# Patient Record
Sex: Male | Born: 1950 | Race: Black or African American | Hispanic: No | Marital: Single | Smoking: Current every day smoker
Health system: Southern US, Community
[De-identification: ages and names within clinical notes are randomized; demographics above are authoritative.]

## PROBLEM LIST (undated history)

## (undated) DIAGNOSIS — I428 Other cardiomyopathies: Secondary | ICD-10-CM

## (undated) DIAGNOSIS — G8929 Other chronic pain: Secondary | ICD-10-CM

## (undated) DIAGNOSIS — R7401 Elevation of levels of liver transaminase levels: Secondary | ICD-10-CM

## (undated) DIAGNOSIS — I77810 Thoracic aortic ectasia: Secondary | ICD-10-CM

## (undated) DIAGNOSIS — I5020 Unspecified systolic (congestive) heart failure: Secondary | ICD-10-CM

## (undated) DIAGNOSIS — Z720 Tobacco use: Secondary | ICD-10-CM

## (undated) DIAGNOSIS — Z789 Other specified health status: Secondary | ICD-10-CM

## (undated) DIAGNOSIS — Z7289 Other problems related to lifestyle: Secondary | ICD-10-CM

## (undated) DIAGNOSIS — I10 Essential (primary) hypertension: Secondary | ICD-10-CM

## (undated) DIAGNOSIS — I2510 Atherosclerotic heart disease of native coronary artery without angina pectoris: Secondary | ICD-10-CM

## (undated) HISTORY — DX: Other chronic pain: G89.29

## (undated) HISTORY — DX: Other cardiomyopathies: I42.8

## (undated) HISTORY — DX: Atherosclerotic heart disease of native coronary artery without angina pectoris: I25.10

## (undated) HISTORY — DX: Unspecified systolic (congestive) heart failure: I50.20

## (undated) HISTORY — DX: Thoracic aortic ectasia: I77.810

## (undated) HISTORY — DX: Other specified health status: Z78.9

## (undated) HISTORY — DX: Elevation of levels of liver transaminase levels: R74.01

## (undated) HISTORY — DX: Other problems related to lifestyle: Z72.89

---

## 2019-08-25 ENCOUNTER — Emergency Department: Payer: Medicare Other

## 2019-08-25 ENCOUNTER — Encounter: Payer: Self-pay | Admitting: Emergency Medicine

## 2019-08-25 ENCOUNTER — Observation Stay: Payer: Medicare Other

## 2019-08-25 ENCOUNTER — Other Ambulatory Visit: Payer: Self-pay

## 2019-08-25 ENCOUNTER — Inpatient Hospital Stay
Admission: EM | Admit: 2019-08-25 | Discharge: 2019-08-29 | DRG: 286 | Disposition: A | Discharge status: Home / Self Care | Payer: Medicare Other | Source: Ambulatory Visit | Attending: Internal Medicine | Admitting: Internal Medicine

## 2019-08-25 ENCOUNTER — Observation Stay (HOSPITAL_COMMUNITY)
Admit: 2019-08-25 | Discharge: 2019-08-25 | Disposition: A | Discharge status: Home / Self Care | Payer: Medicare Other | Attending: Physician Assistant | Admitting: Physician Assistant

## 2019-08-25 DIAGNOSIS — I248 Other forms of acute ischemic heart disease: Secondary | ICD-10-CM | POA: Diagnosis present

## 2019-08-25 DIAGNOSIS — I272 Pulmonary hypertension, unspecified: Secondary | ICD-10-CM | POA: Diagnosis present

## 2019-08-25 DIAGNOSIS — D696 Thrombocytopenia, unspecified: Secondary | ICD-10-CM | POA: Diagnosis present

## 2019-08-25 DIAGNOSIS — I700 Atherosclerosis of aorta: Secondary | ICD-10-CM | POA: Diagnosis present

## 2019-08-25 DIAGNOSIS — I361 Nonrheumatic tricuspid (valve) insufficiency: Secondary | ICD-10-CM

## 2019-08-25 DIAGNOSIS — Z20822 Contact with and (suspected) exposure to covid-19: Secondary | ICD-10-CM | POA: Diagnosis present

## 2019-08-25 DIAGNOSIS — I5021 Acute systolic (congestive) heart failure: Secondary | ICD-10-CM | POA: Diagnosis present

## 2019-08-25 DIAGNOSIS — Z7289 Other problems related to lifestyle: Secondary | ICD-10-CM

## 2019-08-25 DIAGNOSIS — I110 Hypertensive heart disease with heart failure: Principal | ICD-10-CM | POA: Diagnosis present

## 2019-08-25 DIAGNOSIS — F17210 Nicotine dependence, cigarettes, uncomplicated: Secondary | ICD-10-CM | POA: Diagnosis present

## 2019-08-25 DIAGNOSIS — Z832 Family history of diseases of the blood and blood-forming organs and certain disorders involving the immune mechanism: Secondary | ICD-10-CM

## 2019-08-25 DIAGNOSIS — I420 Dilated cardiomyopathy: Secondary | ICD-10-CM | POA: Diagnosis not present

## 2019-08-25 DIAGNOSIS — R778 Other specified abnormalities of plasma proteins: Secondary | ICD-10-CM | POA: Diagnosis present

## 2019-08-25 DIAGNOSIS — Z720 Tobacco use: Secondary | ICD-10-CM | POA: Diagnosis present

## 2019-08-25 DIAGNOSIS — I959 Hypotension, unspecified: Secondary | ICD-10-CM | POA: Diagnosis not present

## 2019-08-25 DIAGNOSIS — F1090 Alcohol use, unspecified, uncomplicated: Secondary | ICD-10-CM

## 2019-08-25 DIAGNOSIS — I5023 Acute on chronic systolic (congestive) heart failure: Secondary | ICD-10-CM

## 2019-08-25 DIAGNOSIS — Z880 Allergy status to penicillin: Secondary | ICD-10-CM

## 2019-08-25 DIAGNOSIS — Z789 Other specified health status: Secondary | ICD-10-CM

## 2019-08-25 DIAGNOSIS — I509 Heart failure, unspecified: Secondary | ICD-10-CM | POA: Diagnosis not present

## 2019-08-25 DIAGNOSIS — I340 Nonrheumatic mitral (valve) insufficiency: Secondary | ICD-10-CM | POA: Diagnosis not present

## 2019-08-25 DIAGNOSIS — I77810 Thoracic aortic ectasia: Secondary | ICD-10-CM | POA: Diagnosis present

## 2019-08-25 DIAGNOSIS — R609 Edema, unspecified: Secondary | ICD-10-CM | POA: Diagnosis not present

## 2019-08-25 DIAGNOSIS — D6959 Other secondary thrombocytopenia: Secondary | ICD-10-CM | POA: Diagnosis present

## 2019-08-25 DIAGNOSIS — R000 Tachycardia, unspecified: Secondary | ICD-10-CM | POA: Diagnosis present

## 2019-08-25 DIAGNOSIS — I2510 Atherosclerotic heart disease of native coronary artery without angina pectoris: Secondary | ICD-10-CM | POA: Diagnosis present

## 2019-08-25 DIAGNOSIS — I479 Paroxysmal tachycardia, unspecified: Secondary | ICD-10-CM | POA: Diagnosis present

## 2019-08-25 DIAGNOSIS — G8929 Other chronic pain: Secondary | ICD-10-CM | POA: Diagnosis present

## 2019-08-25 DIAGNOSIS — K761 Chronic passive congestion of liver: Secondary | ICD-10-CM | POA: Diagnosis present

## 2019-08-25 DIAGNOSIS — M549 Dorsalgia, unspecified: Secondary | ICD-10-CM | POA: Diagnosis present

## 2019-08-25 DIAGNOSIS — I351 Nonrheumatic aortic (valve) insufficiency: Secondary | ICD-10-CM | POA: Diagnosis not present

## 2019-08-25 DIAGNOSIS — R7989 Other specified abnormal findings of blood chemistry: Secondary | ICD-10-CM | POA: Diagnosis present

## 2019-08-25 DIAGNOSIS — E876 Hypokalemia: Secondary | ICD-10-CM | POA: Diagnosis not present

## 2019-08-25 DIAGNOSIS — R21 Rash and other nonspecific skin eruption: Secondary | ICD-10-CM | POA: Diagnosis present

## 2019-08-25 DIAGNOSIS — R945 Abnormal results of liver function studies: Secondary | ICD-10-CM | POA: Diagnosis present

## 2019-08-25 DIAGNOSIS — I2584 Coronary atherosclerosis due to calcified coronary lesion: Secondary | ICD-10-CM | POA: Diagnosis present

## 2019-08-25 HISTORY — DX: Tobacco use: Z72.0

## 2019-08-25 LAB — ECHOCARDIOGRAM COMPLETE
AR max vel: 2.23 cm2
AV Area VTI: 1.97 cm2
AV Area mean vel: 1.8 cm2
AV Mean grad: 3 mmHg
AV Peak grad: 5.4 mmHg
Ao pk vel: 1.16 m/s
Area-P 1/2: 6.05 cm2
Calc EF: 28.6 %
Height: 73 in
P 1/2 time: 220 msec
S' Lateral: 5.14 cm
Single Plane A2C EF: 26.5 %
Single Plane A4C EF: 29.3 %
Weight: 2480 oz

## 2019-08-25 LAB — CBC WITH DIFFERENTIAL/PLATELET
Abs Immature Granulocytes: 0.02 10*3/uL (ref 0.00–0.07)
Basophils Absolute: 0.1 10*3/uL (ref 0.0–0.1)
Basophils Relative: 1 %
Eosinophils Absolute: 0.1 10*3/uL (ref 0.0–0.5)
Eosinophils Relative: 2 %
HCT: 40 % (ref 39.0–52.0)
Hemoglobin: 14 g/dL (ref 13.0–17.0)
Immature Granulocytes: 0 %
Lymphocytes Relative: 24 %
Lymphs Abs: 1.3 10*3/uL (ref 0.7–4.0)
MCH: 32 pg (ref 26.0–34.0)
MCHC: 35 g/dL (ref 30.0–36.0)
MCV: 91.3 fL (ref 80.0–100.0)
Monocytes Absolute: 0.6 10*3/uL (ref 0.1–1.0)
Monocytes Relative: 11 %
Neutro Abs: 3.4 10*3/uL (ref 1.7–7.7)
Neutrophils Relative %: 62 %
Platelets: 116 10*3/uL — ABNORMAL LOW (ref 150–400)
RBC: 4.38 MIL/uL (ref 4.22–5.81)
RDW: 21.7 % — ABNORMAL HIGH (ref 11.5–15.5)
Smear Review: DECREASED
WBC: 5.5 10*3/uL (ref 4.0–10.5)
nRBC: 0.5 % — ABNORMAL HIGH (ref 0.0–0.2)

## 2019-08-25 LAB — COMPREHENSIVE METABOLIC PANEL
ALT: 61 U/L — ABNORMAL HIGH (ref 0–44)
AST: 120 U/L — ABNORMAL HIGH (ref 15–41)
Albumin: 3.8 g/dL (ref 3.5–5.0)
Alkaline Phosphatase: 45 U/L (ref 38–126)
Anion gap: 15 (ref 5–15)
BUN: 17 mg/dL (ref 8–23)
CO2: 22 mmol/L (ref 22–32)
Calcium: 9.1 mg/dL (ref 8.9–10.3)
Chloride: 100 mmol/L (ref 98–111)
Creatinine, Ser: 1 mg/dL (ref 0.61–1.24)
GFR calc Af Amer: >60 mL/min (ref >60)
GFR calc non Af Amer: >60 mL/min (ref >60)
Glucose, Bld: 97 mg/dL (ref 70–99)
Potassium: 3.9 mmol/L (ref 3.5–5.1)
Sodium: 137 mmol/L (ref 135–145)
Total Bilirubin: 2 mg/dL — ABNORMAL HIGH (ref 0.3–1.2)
Total Protein: 6.9 g/dL (ref 6.5–8.1)

## 2019-08-25 LAB — LACTATE DEHYDROGENASE: LDH: 288 U/L — ABNORMAL HIGH (ref 98–192)

## 2019-08-25 LAB — BRAIN NATRIURETIC PEPTIDE: B Natriuretic Peptide: 1505.4 pg/mL — ABNORMAL HIGH (ref 0.0–100.0)

## 2019-08-25 LAB — TROPONIN I (HIGH SENSITIVITY)
Troponin I (High Sensitivity): 92 ng/L — ABNORMAL HIGH (ref <18)
Troponin I (High Sensitivity): 72 ng/L — ABNORMAL HIGH (ref <18)
Troponin I (High Sensitivity): 71 ng/L — ABNORMAL HIGH (ref <18)
Troponin I (High Sensitivity): 90 ng/L — ABNORMAL HIGH (ref <18)

## 2019-08-25 LAB — HEPATITIS PANEL, ACUTE
HCV Ab: NONREACTIVE
Hep A IgM: NONREACTIVE
Hep B C IgM: NONREACTIVE
Hepatitis B Surface Ag: NONREACTIVE

## 2019-08-25 LAB — FIBRIN DERIVATIVES D-DIMER (ARMC ONLY): Fibrin derivatives D-dimer (ARMC): 3885.95 ng/mL (FEU) — ABNORMAL HIGH (ref 0.00–499.00)

## 2019-08-25 LAB — SAVE SMEAR(SSMR), FOR PROVIDER SLIDE REVIEW

## 2019-08-25 LAB — HIV ANTIBODY (ROUTINE TESTING W REFLEX): HIV Screen 4th Generation wRfx: NONREACTIVE

## 2019-08-25 LAB — SARS CORONAVIRUS 2 BY RT PCR (HOSPITAL ORDER, PERFORMED IN ~~LOC~~ HOSPITAL LAB): SARS Coronavirus 2: NEGATIVE

## 2019-08-25 MED ORDER — DM-GUAIFENESIN ER 30-600 MG PO TB12: 1.0000 | ORAL_TABLET | Freq: Two times a day (BID) | ORAL | Status: DC | PRN

## 2019-08-25 MED ORDER — FUROSEMIDE 10 MG/ML IJ SOLN
40.0000 mg | Freq: Once | INTRAMUSCULAR | Status: AC
  Administered 2019-08-25: 40 mg via INTRAVENOUS
  Filled 2019-08-25: qty 4

## 2019-08-25 MED ORDER — LORAZEPAM 2 MG/ML IJ SOLN: 0.0000 mg | Freq: Two times a day (BID) | INTRAMUSCULAR | Status: DC

## 2019-08-25 MED ORDER — IBUPROFEN 400 MG PO TABS
200.0000 mg | ORAL_TABLET | Freq: Four times a day (QID) | ORAL | Status: DC | PRN
  Administered 2019-08-25: 200 mg via ORAL
  Filled 2019-08-25: qty 1

## 2019-08-25 MED ORDER — LORAZEPAM 1 MG PO TABS
1.0000 mg | ORAL_TABLET | ORAL | Status: AC | PRN
  Administered 2019-08-25 – 2019-08-27 (×4): 1 mg via ORAL
  Filled 2019-08-25 (×4): qty 1

## 2019-08-25 MED ORDER — NICOTINE 21 MG/24HR TD PT24
21.0000 mg | MEDICATED_PATCH | Freq: Once | TRANSDERMAL | Status: AC
  Administered 2019-08-25: 21 mg via TRANSDERMAL
  Filled 2019-08-25: qty 1

## 2019-08-25 MED ORDER — ALBUTEROL SULFATE (2.5 MG/3ML) 0.083% IN NEBU: 2.5000 mg | INHALATION_SOLUTION | RESPIRATORY_TRACT | Status: DC | PRN

## 2019-08-25 MED ORDER — SODIUM CHLORIDE 0.9% FLUSH: 3.0000 mL | INTRAVENOUS | Status: DC | PRN

## 2019-08-25 MED ORDER — SACUBITRIL-VALSARTAN 24-26 MG PO TABS
1.0000 | ORAL_TABLET | Freq: Two times a day (BID) | ORAL | Status: DC
  Administered 2019-08-25 – 2019-08-29 (×7): 1 via ORAL
  Filled 2019-08-25 (×7): qty 1

## 2019-08-25 MED ORDER — IOHEXOL 350 MG/ML SOLN
75.0000 mL | Freq: Once | INTRAVENOUS | Status: AC | PRN
  Administered 2019-08-25: 75 mL via INTRAVENOUS

## 2019-08-25 MED ORDER — ADULT MULTIVITAMIN W/MINERALS CH
1.0000 | ORAL_TABLET | Freq: Every day | ORAL | Status: DC
  Administered 2019-08-25 – 2019-08-29 (×5): 1 via ORAL
  Filled 2019-08-25 (×5): qty 1

## 2019-08-25 MED ORDER — SODIUM CHLORIDE 0.9 % IV SOLN: 250.0000 mL | INTRAVENOUS | Status: DC | PRN

## 2019-08-25 MED ORDER — THIAMINE HCL 100 MG PO TABS
100.0000 mg | ORAL_TABLET | Freq: Every day | ORAL | Status: DC
  Administered 2019-08-25 – 2019-08-29 (×5): 100 mg via ORAL
  Filled 2019-08-25 (×5): qty 1

## 2019-08-25 MED ORDER — FUROSEMIDE 10 MG/ML IJ SOLN: 20.0000 mg | Freq: Two times a day (BID) | INTRAMUSCULAR | Status: DC

## 2019-08-25 MED ORDER — FUROSEMIDE 10 MG/ML IJ SOLN
40.0000 mg | Freq: Three times a day (TID) | INTRAMUSCULAR | Status: DC
  Administered 2019-08-25 – 2019-08-27 (×5): 40 mg via INTRAVENOUS
  Filled 2019-08-25 (×5): qty 4

## 2019-08-25 MED ORDER — ASPIRIN EC 81 MG PO TBEC
81.0000 mg | DELAYED_RELEASE_TABLET | Freq: Every day | ORAL | Status: DC
  Administered 2019-08-25 – 2019-08-29 (×4): 81 mg via ORAL
  Filled 2019-08-25 (×5): qty 1

## 2019-08-25 MED ORDER — MAGNESIUM SULFATE 2 GM/50ML IV SOLN
2.0000 g | Freq: Once | INTRAVENOUS | Status: AC
  Administered 2019-08-25: 2 g via INTRAVENOUS
  Filled 2019-08-25: qty 50

## 2019-08-25 MED ORDER — HYDRALAZINE HCL 20 MG/ML IJ SOLN
5.0000 mg | INTRAMUSCULAR | Status: DC | PRN
  Administered 2019-08-25: 5 mg via INTRAVENOUS
  Filled 2019-08-25: qty 1

## 2019-08-25 MED ORDER — SODIUM CHLORIDE 0.9% FLUSH
3.0000 mL | Freq: Two times a day (BID) | INTRAVENOUS | Status: DC
  Administered 2019-08-25 – 2019-08-28 (×6): 3 mL via INTRAVENOUS

## 2019-08-25 MED ORDER — FOLIC ACID 1 MG PO TABS
1.0000 mg | ORAL_TABLET | Freq: Every day | ORAL | Status: DC
  Administered 2019-08-25 – 2019-08-29 (×5): 1 mg via ORAL
  Filled 2019-08-25 (×5): qty 1

## 2019-08-25 MED ORDER — LORAZEPAM 2 MG/ML IJ SOLN: 0.0000 mg | Freq: Four times a day (QID) | INTRAMUSCULAR | Status: AC

## 2019-08-25 MED ORDER — LORAZEPAM 2 MG/ML IJ SOLN
1.0000 mg | INTRAMUSCULAR | Status: AC | PRN
  Administered 2019-08-26: 1 mg via INTRAVENOUS
  Filled 2019-08-25 (×2): qty 1

## 2019-08-25 MED ORDER — METOPROLOL TARTRATE 25 MG PO TABS: 25.0000 mg | ORAL_TABLET | Freq: Two times a day (BID) | ORAL | Status: DC

## 2019-08-25 MED ORDER — ENOXAPARIN SODIUM 40 MG/0.4ML ~~LOC~~ SOLN
40.0000 mg | SUBCUTANEOUS | Status: DC
  Administered 2019-08-26 – 2019-08-28 (×3): 40 mg via SUBCUTANEOUS
  Filled 2019-08-25 (×3): qty 0.4

## 2019-08-25 MED ORDER — ALBUTEROL SULFATE HFA 108 (90 BASE) MCG/ACT IN AERS
2.0000 | INHALATION_SPRAY | RESPIRATORY_TRACT | Status: DC | PRN
  Filled 2019-08-25: qty 6.7

## 2019-08-25 MED ORDER — LISINOPRIL 5 MG PO TABS
2.5000 mg | ORAL_TABLET | Freq: Every day | ORAL | Status: DC
  Administered 2019-08-25: 2.5 mg via ORAL
  Filled 2019-08-25: qty 1

## 2019-08-25 MED ORDER — THIAMINE HCL 100 MG/ML IJ SOLN: 100.0000 mg | Freq: Every day | INTRAMUSCULAR | Status: DC

## 2019-08-25 MED ORDER — METOPROLOL SUCCINATE ER 25 MG PO TB24
25.0000 mg | ORAL_TABLET | Freq: Every day | ORAL | Status: DC
  Administered 2019-08-25 – 2019-08-29 (×4): 25 mg via ORAL
  Filled 2019-08-25 (×4): qty 1

## 2019-08-25 NOTE — ED Notes (Signed)
Attempted IV x2 without success. Alternate RN attempted IV without success.

## 2019-08-25 NOTE — Progress Notes (Signed)
*  PRELIMINARY RESULTS* Echocardiogram 2D Echocardiogram has been performed.  Curtis Walters 08/25/2019, 3:38 PM

## 2019-08-25 NOTE — Progress Notes (Signed)
Notified Curtis Walters that patients HR has been elevated in 120s my whole shift. She stated to just continue to monitor at this time.

## 2019-08-25 NOTE — ED Notes (Signed)
Pt c/o left foot swelling. Pt denies injury, denies diabetes/CHF or major medical problems. Pt states right foot was previously swollen as well but that resolved. Left foot is cool to touch (cool to touch on right side as well), non-pitting edema noted, CMS intact. Pt c/o of mild pain in left foot radiating into left leg.

## 2019-08-25 NOTE — ED Triage Notes (Signed)
Pt to ED via POV, pt was brought over from [facility]. Pt states that he has been having swelling in both his feet. Pt states that he noticed this a few days ago. Pt denies hx/o CHF. Pt is not having chest pain or shortness of breath. Pt does have significant swelling in both feet. Non-pitting edema.

## 2019-08-25 NOTE — ED Notes (Signed)
Pt refuses to remain in bed- states he needs to walk around and stretch. Pt educated on pt privacy policy and requested to remain in room. Pt verbalizes understanding.

## 2019-08-25 NOTE — ED Provider Notes (Signed)
[facility] Emergency Department Provider Note    First MD Initiated Contact with Patient 08/25/19 1119     (approximate)  I have reviewed the triage vital signs and the nursing notes.   HISTORY  Chief Complaint Leg Swelling    HPI Curtis Walters is a 69 y.o. male presents to the ER for evaluation of worsening exertional dyspnea orthopnea as well as lower extremity swelling.  Patient does smoke.  States his symptoms have been progressively worsening over the past several weeks to months not have any chest pain.  Denies any history of heart disease.  Feels like over the past few days has suddenly gotten much worse.  States that is gone the point where when he gets up and starts walking he feels like he is about to pass out.    History reviewed. No pertinent past medical history. No family history on file. History reviewed. No pertinent surgical history. There are no problems to display for this patient.     Prior to Admission medications   Not on File    Allergies Penicillins    Social History Social History   Tobacco Use  . Smoking status: Current Every Day Smoker  . Smokeless tobacco: Never Used  Substance Use Topics  . Alcohol use: Yes  . Drug use: Not Currently    Review of Systems Patient denies headaches, rhinorrhea, blurry vision, numbness, shortness of breath, chest pain, edema, cough, abdominal pain, nausea, vomiting, diarrhea, dysuria, fevers, rashes or hallucinations unless otherwise stated above in HPI. ____________________________________________   PHYSICAL EXAM:  VITAL SIGNS: Vitals:   08/25/19 1123 08/25/19 1202  BP:  (!) 141/96  Pulse: (!) 122 (!) 118  Resp:    Temp:    SpO2: 100% 100%    Constitutional: Alert and oriented.  Eyes: Conjunctivae are normal.  Head: Atraumatic. Nose: No congestion/rhinnorhea. Mouth/Throat: Mucous membranes are moist.   Neck: No stridor. Painless ROM.  Cardiovascular: Normal rate,  regular rhythm. Grossly normal heart sounds.  Good peripheral circulation. Respiratory: Normal respiratory effort.  No retractions. Lungs CTAB. Gastrointestinal: Soft and nontender. No distention. No abdominal bruits. No CVA tenderness. Genitourinary:  Musculoskeletal: No lower extremity tenderness 2+ BLE edema.  No joint effusions. Neurologic:  Normal speech and language. No gross focal neurologic deficits are appreciated. No facial droop Skin:  Skin is warm, dry and intact. No rash noted. Psychiatric: Mood and affect are normal. Speech and behavior are normal.  ____________________________________________   LABS (all labs ordered are listed, but only abnormal results are displayed)  Results for orders placed or performed during the hospital encounter of 08/25/19 (from the past 24 hour(s))  CBC with Differential     Status: Abnormal   Collection Time: 08/25/19  9:57 AM  Result Value Ref Range   WBC 5.5 4.0 - 10.5 K/uL   RBC 4.38 4.22 - 5.81 MIL/uL   Hemoglobin 14.0 13.0 - 17.0 g/dL   HCT 67.8 39 - 52 %   MCV 91.3 80.0 - 100.0 fL   MCH 32.0 26.0 - 34.0 pg   MCHC 35.0 30.0 - 36.0 g/dL   RDW 93.8 (H) 10.1 - 75.1 %   Platelets 116 (L) 150 - 400 K/uL   nRBC 0.5 (H) 0.0 - 0.2 %   Neutrophils Relative % 62 %   Neutro Abs 3.4 1.7 - 7.7 K/uL   Lymphocytes Relative 24 %   Lymphs Abs 1.3 0.7 - 4.0 K/uL   Monocytes Relative 11 %  Monocytes Absolute 0.6 0 - 1 K/uL   Eosinophils Relative 2 %   Eosinophils Absolute 0.1 0 - 0 K/uL   Basophils Relative 1 %   Basophils Absolute 0.1 0 - 0 K/uL   WBC Morphology MORPHOLOGY UNREMARKABLE    RBC Morphology MORPHOLOGY UNREMARKABLE    Smear Review PLATELETS APPEAR DECREASED    Immature Granulocytes 0 %   Abs Immature Granulocytes 0.02 0.00 - 0.07 K/uL   Giant PLTs PRESENT   Comprehensive metabolic panel     Status: Abnormal   Collection Time: 08/25/19  9:57 AM  Result Value Ref Range   Sodium 137 135 - 145 mmol/L   Potassium 3.9 3.5 - 5.1  mmol/L   Chloride 100 98 - 111 mmol/L   CO2 22 22 - 32 mmol/L   Glucose, Bld 97 70 - 99 mg/dL   BUN 17 8 - 23 mg/dL   Creatinine, Ser 4.31 0.61 - 1.24 mg/dL   Calcium 9.1 8.9 - 54.0 mg/dL   Total Protein 6.9 6.5 - 8.1 g/dL   Albumin 3.8 3.5 - 5.0 g/dL   AST 086 (H) 15 - 41 U/L   ALT 61 (H) 0 - 44 U/L   Alkaline Phosphatase 45 38 - 126 U/L   Total Bilirubin 2.0 (H) 0.3 - 1.2 mg/dL   GFR calc non Af Amer >60 >60 mL/min   GFR calc Af Amer >60 >60 mL/min   Anion gap 15 5 - 15  Brain natriuretic peptide     Status: Abnormal   Collection Time: 08/25/19  9:57 AM  Result Value Ref Range   B Natriuretic Peptide 1,505.4 (H) 0.0 - 100.0 pg/mL   ____________________________________________  EKG My review and personal interpretation at Time: 9:57   Indication: edema  Rate: 135  Rhythm: sinus Axis: left Other: poor r wave progression, prolonged qt ____________________________________________  RADIOLOGY  I personally reviewed all radiographic images ordered to evaluate for the above acute complaints and reviewed radiology reports and findings.  These findings were personally discussed with the patient.  Please see medical record for radiology report.  ____________________________________________   PROCEDURES  Procedure(s) performed:  Procedures    Critical Care performed: no ____________________________________________   INITIAL IMPRESSION / ASSESSMENT AND PLAN / ED COURSE  Pertinent labs & imaging results that were available during my care of the patient were reviewed by me and considered in my medical decision making (see chart for details).   DDX: Asthma, copd, CHF, pna, ptx, malignancy, Pe, anemia   Curtis Walters is a 69 y.o. who presents to the ED with symptoms as described above.  Patient not any hypoxia but is showing signs of acute congestive heart failure.  No history of CAD but is a smoker with a history of hypertension.  Denies any other substances.  Primary complaint  is worsening peripheral edema and exertional dyspnea.  Chest x-ray with cardiomegaly.  Will give Lasix for his edema.  Elevated BNP.  Will give IV magnesium as the patient does have prolonged QT with his tachycardia.  Do feel patient will need admission to hospital for medical work-up, iv diuresis.     The patient was evaluated in Emergency Department today for the symptoms described in the history of present illness. He/she was evaluated in the context of the global COVID-19 pandemic, which necessitated consideration that the patient might be at risk for infection with the SARS-CoV-2 virus that causes COVID-19. Institutional protocols and algorithms that pertain to the evaluation of patients at risk  for COVID-19 are in a state of rapid change based on information released by regulatory bodies including the CDC and federal and state organizations. These policies and algorithms were followed during the patient's care in the ED.  As part of my medical decision making, I reviewed the following data within the electronic MEDICAL RECORD NUMBER Nursing notes reviewed and incorporated, Labs reviewed, notes from prior ED visits and [place] Controlled Substance Database   ____________________________________________   FINAL CLINICAL IMPRESSION(S) / ED DIAGNOSES  Final diagnoses:  Edema, unspecified type      NEW MEDICATIONS STARTED DURING THIS VISIT:  New Prescriptions   No medications on file     Note:  This document was prepared using Dragon voice recognition software and may include unintentional dictation errors.    Willy Eddy, MD 08/25/19 1312

## 2019-08-25 NOTE — Plan of Care (Signed)
  Problem: Education: Goal: Knowledge of General Education information will improve Description Including pain rating scale, medication(s)/side effects and non-pharmacologic comfort measures Outcome: Progressing   Problem: Health Behavior/Discharge Planning: Goal: Ability to manage health-related needs will improve Outcome: Progressing   

## 2019-08-25 NOTE — Consult Note (Addendum)
Cardiology Consultation:   Patient ID: Curtis Walters MRN: [id number]; DOB: [birth year]  Admit date: 08/25/2019 Date of Consult: 08/25/2019  Primary Care Provider: Patient, No Pcp Per Primary Cardiologist: [facility], Dr. Mariah Milling rounding Primary Electrophysiologist:  None   Patient Profile:   Curtis Walters is a 69 y.o. male with prolonged history of tobacco use (started at 38 or 69 yo), back pain/herniated disks ambulating with a cane, and who is being seen today for the evaluation of acute heart failure with EF unknown and volume overload at presentation at the request of Dr. Clyde Lundborg.  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 Curtis Walters recently moved back to [facility] and does not have a PCP.  Curtis Walters reports that Curtis Walters has smoked cigarettes since the age of 19 or 67 and has no desire to quit at this time.  Curtis Walters currently drinks a beer every other day.  Curtis Walters is not on any current medications.    Curtis Walters denies any family history of cardiac disease or arrhythmia.  His son has diabetes.  Curtis Walters denies a personal history of hypertension with elevated BP today noted in the ED.  Curtis Walters notes that his BP was high back in [place] but otherwise Curtis Walters does not believe it runs high.   Curtis Walters reports that Curtis Walters previously was fairly active and for many years ran 4 to 5 miles every morning.  Given the above reported herniated disks, his activity is more limited now and Curtis Walters often ambulates with a cane.  Curtis Walters reports a poor diet with lots of preprocessed foods, admitting that Curtis Walters likely does get a lot of salt in his diet.  Curtis Walters drinks approximately 3 cans of soda per day and max 1 glass of water per day. As above, Curtis Walters continues to smoke and notes a beer every other day. Curtis Walters notes frequent dark urine, often amber in color.  Recently, Curtis Walters is also  noticed darker stools.    Curtis Walters reports receiving a dose of the COVID-19 vaccine in February 2021.  Curtis Walters subsequently received his second dose in March 2021.  Interestingly, Curtis Walters notes receiving a dose of Pfizer vaccine and maternal vaccine.  Curtis Walters reports that, shortly thereafter, Curtis Walters experienced progressive symptoms as detailed below.  Curtis Walters first noted that his feet were swollen much larger than their usual size.  Curtis Walters reports that the swelling seemed to progress and would alternate between right and left lower extremity, describing occasional asymmetric swelling.  Shortly thereafter, Curtis Walters noticed progressive fatigue.  Occasionally, Curtis Walters noticed racing heart rate and palpitations that would last minutes before resolving on its own.  Curtis Walters occasionally feels dizzy and as if Curtis Walters might pass out but sits down immediately with resolution of this feeling.  No loss of consciousness.  Curtis Walters denied any chest pain.  Curtis Walters noted sporadic shortness of breath and dyspnea without any clear triggers.  In addition, Curtis Walters developed orthopnea and had to use 4-5 pillows at night given shortness of breath when laying flat.  No PND; however, Curtis Walters did note that Curtis Walters would often wake up with "night sweats."  Curtis Walters did report early satiety.  No abdominal distention noted.  Curtis Walters also reported a full body rash, visualized on exam today.  Curtis Walters denies any recent viral illness or colds.  No reported fevers.  In the ED, vitals significant for HR 124 bpm with SBP 130s to 140s and DBP 90s to low 100s.  SPO2 99% on room air.  EKG showed sinus tachycardiainus tachycardia, 125 bpm, right axis deviation, poor R wave progression, prolonged QTC at 600 ms, likely prior anterior infarct.  Labs significant for elevated liver enzymes with AST 120 and ALT 61.  Total bilirubin 2.0.  BNP elevated at 1505.4.  Creatinine 1.00, BUN 17.  Platelets 116.  High-sensitivity troponin 92 and cycling.  Chest x-ray showed cardiomegaly and small left-sided effusion with aortic atherosclerosis.    Stat echo  has been ordered based on preliminary exam as below.  Stat telemetry also ordered, given Curtis Walters is not currently on telemetry with prolonged QTC and currently tachycardic with EKG as above and elevated high-sensitivity troponin.  D-dimer order also placed while in the ED.  Heart Pathway Score:     Past Medical History:  Diagnosis Date  . Tobacco abuse   Curtis Walters denies any history of surgeries.  History reviewed. No pertinent surgical history.   Home Medications:  Prior to Admission medications   Not on File  Curtis Walters denies any PTA medications.  Inpatient Medications: Scheduled Meds: . [START ON 08/26/2019] furosemide  20 mg Intravenous Q12H  . nicotine  21 mg Transdermal Once   Continuous Infusions:  PRN Meds: albuterol, dextromethorphan-guaiFENesin  Allergies:    Allergies  Allergen Reactions  . Penicillins Other (See Comments)    Did it involve swelling of the face/tongue/throat, SOB, or low BP? No Did it involve sudden or severe rash/hives, skin peeling, or any reaction on the inside of your mouth or nose? No Did you need to seek medical attention at a hospital or doctor's office? No When did it last happen? If all above answers are "NO", may proceed with cephalosporin use.    Social History:   Social History   Socioeconomic History  . Marital status: Single    Spouse name: Not on file  . Number of children: Not on file  . Years of education: Not on file  . Highest education level: Not on file  Occupational History  . Not on file  Tobacco Use  . Smoking status: Current Every Day Smoker  . Smokeless tobacco: Never Used  Substance and Sexual Activity  . Alcohol use: Yes  . Drug use: Not Currently  . Sexual activity: Not on file  Other Topics Concern  . Not on file  Social History Narrative  . Not on file   Social Determinants of Health   Financial Resource Strain:   . Difficulty of Paying Living Expenses:   Food Insecurity:   . Worried About Programme researcher, broadcasting/film/video  in the Last Year:   . Barista in the Last Year:   Transportation Needs:   . Freight forwarder (Medical):   Marland Kitchen Lack of Transportation (Non-Medical):   Physical Activity:   . Days of Exercise per Week:   . Minutes of Exercise per Session:   Stress:   . Feeling of Stress :   Social Connections:   . Frequency of Communication with Friends and Family:   . Frequency of Social Gatherings with Friends and Family:   . Attends Religious Services:   . Active Member of Clubs or Organizations:   . Attends Banker Meetings:   Marland Kitchen Marital Status:   Intimate Partner Violence:   . Fear of Current or Ex-Partner:   . Emotionally Abused:   .  Physically Abused:   . Sexually Abused:     Family History:   No family history on file.  Curtis Walters denies a family history of MI, heart failure, or arrhythmia.  ROS:  Please see the history of present illness.  Review of Systems  Constitutional: Positive for diaphoresis, malaise/fatigue and weight loss. Negative for chills and fever.       Curtis Walters reports frequent night sweats.  Curtis Walters reports reduced appetite.  Respiratory: Positive for shortness of breath. Negative for cough, hemoptysis, sputum production and wheezing.   Cardiovascular: Positive for palpitations, orthopnea and leg swelling. Negative for chest pain, claudication and PND.  Gastrointestinal: Positive for melena. Negative for blood in stool.  Genitourinary:       Amber urine reported but no bright red blood in the toilet.  Musculoskeletal: Positive for back pain, joint pain and myalgias. Negative for falls.       History of herniated disks.  No recent falls.  Neurological: Positive for dizziness and weakness. Negative for loss of consciousness.  Psychiatric/Behavioral: The patient does not have insomnia.        No reported symptoms consistent with sleep apnea.  All other systems reviewed and are negative.   All other ROS reviewed and negative.     Physical Exam/Data:   Vitals:    08/25/19 1118 08/25/19 1123 08/25/19 1202 08/25/19 1338  BP: (!) 141/105  (!) 141/96 (!) 145/109  Pulse:  (!) 122 (!) 118 (!) 124  Resp:      Temp:      TempSrc:      SpO2:  100% 100% 98%  Weight:      Height:       No intake or output data in the 24 hours ending 08/25/19 1513 Last 3 Weights 08/25/2019  Weight (lbs) 155 lb  Weight (kg) 70.308 kg     Body mass index is 20.45 kg/m.  General:  Well nourished, well developed, in no acute distress.  Joined by his fiance. HEENT: normal Neck: +JVD Vascular:radial pulses 2+ bilaterally Cardiac:  normal S1, S2; tachycardic but regular; 3/6 holosystolic murmur radiating into the carotids and appreciated throughout the entire cardiac auscultation Lungs: Left-sided basilar crackles with reduced breath sounds at right base Abd: Given pain associated with abdominal rash, did not apply pressure to abdomen Ext: 3+ bilateral pitting edema extending up past the knee into the thigh.  Bilateral pedal edema. Musculoskeletal:  No deformities, BUE and BLE strength normal and equal Skin: warm and dry.  Diffuse rash noted along his back.  Small area of rash noted on his abdomen as well. Neuro:  No focal abnormalities noted Psych:  Normal affect   EKG:  The EKG was personally reviewed and demonstrates:  Sinus tachycardia, 125 bpm, right axis deviation, poor R wave progression, prolonged QTC at 600 ms, poor R wave progression noted in precordial leads, likely prior anterior infarct.  Telemetry:  Telemetry was personally reviewed and demonstrates: Not on telemetry.  Nursing care order placed with request to place patient on telemetry at this time.  Relevant CV Studies: Pending stat echocardiogram  Laboratory Data:  High Sensitivity Troponin:   Recent Labs  Lab 08/25/19 1256  TROPONINIHS 92*     Cardiac EnzymesNo results for input(s): TROPONINI in the last 168 hours. No results for input(s): TROPIPOC in the last 168 hours.  Chemistry Recent Labs   Lab 08/25/19 0957  NA 137  K 3.9  CL 100  CO2 22  GLUCOSE 97  BUN 17  CREATININE 1.00  CALCIUM 9.1  GFRNONAA >60  GFRAA >60  ANIONGAP 15    Recent Labs  Lab 08/25/19 0957  PROT 6.9  ALBUMIN 3.8  AST 120*  ALT 61*  ALKPHOS 45  BILITOT 2.0*   Hematology Recent Labs  Lab 08/25/19 0957  WBC 5.5  RBC 4.38  HGB 14.0  HCT 40.0  MCV 91.3  MCH 32.0  MCHC 35.0  RDW 21.7*  PLT 116*   BNP Recent Labs  Lab 08/25/19 0957  BNP 1,505.4*    DDimer No results for input(s): DDIMER in the last 168 hours.   Radiology/Studies:  DG Chest Portable 1 View  Result Date: 08/25/2019 CLINICAL DATA:  Lower extremity edema EXAM: PORTABLE CHEST 1 VIEW COMPARISON:  None. FINDINGS: There is a an equivocal left pleural effusion. Lungs elsewhere are clear. There is cardiomegaly with pulmonary vascularity normal. No adenopathy. There is aortic atherosclerosis. No bone lesions. IMPRESSION: Cardiomegaly. Equivocal small left pleural effusion. Lungs otherwise clear. No adenopathy. Aortic Atherosclerosis (ICD10-I70.0). Electronically Signed   By: Bretta Bang III M.D.   On: 08/25/2019 11:47    Assessment and Plan:   New acute onset heart failure, EF currently unknown --Reports progressive symptoms consistent with acute heart failure and volume overload as detailed above.  Sx started after COVID-19 vaccine, reporting receipt of both Moderna and Pfizer vaccines.  No previously known history of heart disease.  Chest x-ray shows cardiomegaly. --Massively volume overloaded on exam.  BNP elevated as above. --Continue IV Lasix started in the ED.  Uptitrate as renal function allows.  Continue and monitor I's/O's, daily standing weights.  Daily BMET to monitor renal function and electrolytes.  Low-sodium diet. --Etiology of heart failure currently unknown.  Considered is tachycardia induced cardiomyopathy v hypertensive heart disease given current vitals.  Also considered is pulmonary embolism given  HPI and EKG findings with RAD, tachycardic rate, prolonged QTc. Given history of smoking and elevated troponin, also considered is cardiac ischemia. --Stat echo and Ddimer ordered.  Will need to determine etiology of diffuse murmur on exam. Given concern for PE, we will want to look for any evidence of right heart failure. Will assess EF and rule out any other acute structural changes. If EF reduced, further ischemic work-up recommended with MPI or catheterization with likely need for both R/LHC.  If D-dimer elevated, recommend STAT CTA versus V/Q to rule out pulmonary embolism +/- venous ultrasound of lower extremities.  Further recommendations pending echo / D-dimer.   Prolonged QTC, critical value --Avoid any medications associated with lengthening QTC. Considered is PE as above. Denies any medications PTA that might lengthen QT.  Repeat EKG ordered to confirm QTC of 600.  Elevated high-sensitivity troponin --No reported chest pain.  Considered is dyspnea as anginal equivalent.  EKG without acute ST/T changes.  High-sensitivity troponin elevated at 92.  Continue to cycle.  Echo pending.  If EF reduced, recommend further ischemic work-up with Lexiscan +/- R/LHC so can also evaluate hemodynamics and heart pressures.  Risk factors for CAD include prolonged history of smoking and male.  Ordered labs for further risk stratification, including lipid panel.  As below, recommend monitor thrombocytopenia if heparin indicated given low platelet count on initial labs.  Thrombocytopenia -Recommend daily CBC.  Etiology unknown. Given thrombocytopenia, caution with heparin / antiplatelet therapy.  Melena, hematuria? --Reports darker stools and amber urine.  Daily CBC.  Further work-up per IM.  Elevated LFTs --Likely as 2/2 volume overload.  Defer to IM for further work-up.  Monitor.   Tachycardia --Reports recent episodes of fast heart rate and palpitations, lasting minutes.  [facility] ED EKG as above shows sinus  tachycardia.  Concern for PE given asymmetric edema versus volume overload. Recommend start metoprolol tartrate and adjust as needed for both heart rate and blood pressure control with further medication recommendations pending resulted echo.  Nursing order placed to put patient on telemetry.  Continue to monitor electrolytes.  TSH order placed.  Goal heart rate less than 110 bpm.  HTN --BP currently elevated.  Denies previous history of hypertension.  Continue IV diuresis as volume status likely worsening pressures.  Given his tachycardia, recommend starting on metoprolol tartrate and titrating as needed for heart rate and blood pressure control.   Goal BP less than 130/80.  TSH pending.  Full body rash, etiology unknown  --Etiology unknown.  Consider as secondary to elevated liver enzymes.  Defer to IM for further work-up.  Night sweats --Defer to IM for further work-up and with consideration of his prolonged history of tobacco use.   Current Tobacco use --Cessation advised.  Curtis Walters does not wish to quit smoking at this time.  Aortic atherosclerosis --Recommend lipid panel.  Caution if labs indicate start of statin, given elevated LFTs.  Risk factor modification.   For questions or updates, please contact [facility] Please consult [email or web address] for contact info under     Signed, Lennon Alstrom, PA-C  08/25/2019 3:13 PM

## 2019-08-25 NOTE — ED Notes (Signed)
Per wife pt went outside to smoke

## 2019-08-25 NOTE — H&P (Signed)
History and Physical    Curtis Walters YHC:[id number] DOB: 04-06-1950 DOA: 08/25/2019  Referring MD/NP/PA:   PCP: Patient, No Pcp Per   Patient coming from:  The patient is coming from home.  At baseline, pt is independent for most of ADL.        Chief Complaint: Leg edema, shortness breath  HPI: Curtis Walters is a 69 y.o. male without significant past medical history except for tobacco abuse, alcohol use, who presents with leg edema and shortness of breath  Patient states that he has been having lower extremity swelling and exertional shortness of breath for several weeks, which has been progressively worsening, particularly in the past several days. He has orthopnea and needs to use 4 pillows at night. Has mild dry cough, no chest pain, fever or chills.  Denies nausea vomiting, diarrhea or abdominal pain.  No symptoms of UTI or unilateral weakness.  Patient states that he drinks 1 large cans of beer every day.  ED Course: pt was found to have BNP 1505, D-dimer 3885, WBC 5.5, thrombocytopenia with platelets of 116, pending COVID-19 PCR, electrolytes renal function okay, abnormal liver function (ALP 45, AST 120, ALT 61, total bilirubin 2.0), temperature normal, blood pressure 141/96, tachycardia, RR 16, oxygen saturation 99% on room air.  Chest x-ray showed cardiomegaly and small amount of left pleural effusion.  Patient is placed on progressive bed for observation.  Dr. Mariah Milling of cardiology is consulted.  Review of Systems:   General: no fevers, chills, no body weight gain, has fatigue HEENT: no blurry vision, hearing changes or sore throat Respiratory: has dyspnea, no coughing, wheezing CV: no chest pain, no palpitations GI: no nausea, vomiting, abdominal pain, diarrhea, constipation GU: no dysuria, burning on urination, increased urinary frequency, hematuria  Ext: has leg edema Neuro: no unilateral weakness, numbness, or tingling, no vision change or hearing loss Skin: no rash, no skin  tear. MSK: No muscle spasm, no deformity, no limitation of range of movement in spin Heme: No easy bruising.  Travel history: No recent long distant travel.  Allergy:  Allergies  Allergen Reactions  . Penicillins Other (See Comments)    Did it involve swelling of the face/tongue/throat, SOB, or low BP? No Did it involve sudden or severe rash/hives, skin peeling, or any reaction on the inside of your mouth or nose? No Did you need to seek medical attention at a hospital or doctor's office? No When did it last happen? If all above answers are "NO", may proceed with cephalosporin use.    Past Medical History:  Diagnosis Date  . Tobacco abuse     History reviewed. No pertinent surgical history.  Social History:  reports that he has been smoking. He has never used smokeless tobacco. He reports current alcohol use. He reports previous drug use.  Family History:  Family History  Problem Relation Age of Onset  . Sickle cell anemia Sister      Prior to Admission medications   Not on File    Physical Exam: Vitals:   08/25/19 1338 08/25/19 1602 08/25/19 1613 08/25/19 1708  BP: (!) 145/109 140/86  (!) 150/117  Pulse: (!) 124 (!) 123  (!) 129  Resp:  17  18  Temp:    97.9 F (36.6 C)  TempSrc:    Oral  SpO2: 98% 98% 98% 100%  Weight:    66.7 kg  Height:    6\' 1"  (1.854 m)   General: Not in acute distress HEENT:  Eyes: PERRL, EOMI, no scleral icterus.       ENT: No discharge from the ears and nose, no pharynx injection, no tonsillar enlargement.        Neck: positive JVD, no bruit, no mass felt. Heme: No neck lymph node enlargement. Cardiac: S1/S2, RRR, No murmurs, No gallops or rubs. Respiratory: No rales, wheezing, rhonchi or rubs. GI: Soft, nondistended, nontender, no rebound pain, no organomegaly, BS present. GU: No hematuria Ext: has 1+pitting leg edema bilaterally. 1+DP/PT pulse bilaterally. Musculoskeletal: No joint deformities, No joint redness or  warmth, no limitation of ROM in spin. Skin: No rashes.  Neuro: Alert, oriented X3, cranial nerves II-XII grossly intact, moves all extremities normally.  Psych: Patient is not psychotic, no suicidal or hemocidal ideation.  Labs on Admission: I have personally reviewed following labs and imaging studies  CBC: Recent Labs  Lab 08/25/19 0957  WBC 5.5  NEUTROABS 3.4  HGB 14.0  HCT 40.0  MCV 91.3  PLT 116*   Basic Metabolic Panel: Recent Labs  Lab 08/25/19 0957  NA 137  K 3.9  CL 100  CO2 22  GLUCOSE 97  BUN 17  CREATININE 1.00  CALCIUM 9.1   GFR: Estimated Creatinine Clearance: 64.8 mL/min (by C-G formula based on SCr of 1 mg/dL). Liver Function Tests: Recent Labs  Lab 08/25/19 0957  AST 120*  ALT 61*  ALKPHOS 45  BILITOT 2.0*  PROT 6.9  ALBUMIN 3.8   No results for input(s): LIPASE, AMYLASE in the last 168 hours. No results for input(s): AMMONIA in the last 168 hours. Coagulation Profile: No results for input(s): INR, PROTIME in the last 168 hours. Cardiac Enzymes: No results for input(s): CKTOTAL, CKMB, CKMBINDEX, TROPONINI in the last 168 hours. BNP (last 3 results) No results for input(s): PROBNP in the last 8760 hours. HbA1C: No results for input(s): HGBA1C in the last 72 hours. CBG: No results for input(s): GLUCAP in the last 168 hours. Lipid Profile: No results for input(s): CHOL, HDL, LDLCALC, TRIG, CHOLHDL, LDLDIRECT in the last 72 hours. Thyroid Function Tests: No results for input(s): TSH, T4TOTAL, FREET4, T3FREE, THYROIDAB in the last 72 hours. Anemia Panel: No results for input(s): VITAMINB12, FOLATE, FERRITIN, TIBC, IRON, RETICCTPCT in the last 72 hours. Urine analysis: No results found for: COLORURINE, APPEARANCEUR, LABSPEC, PHURINE, GLUCOSEU, HGBUR, BILIRUBINUR, KETONESUR, PROTEINUR, UROBILINOGEN, NITRITE, LEUKOCYTESUR Sepsis Labs: @LABRCNTIP (procalcitonin:4,lacticidven:4) ) Recent Results (from the past 240 hour(s))  SARS Coronavirus 2  by RT PCR (hospital order, performed in [facility] hospital lab) Nasopharyngeal Nasopharyngeal Swab     Status: None   Collection Time: 08/25/19 12:29 PM   Specimen: Nasopharyngeal Swab  Result Value Ref Range Status   SARS Coronavirus 2 NEGATIVE NEGATIVE Final    Comment: (NOTE) SARS-CoV-2 target nucleic acids are NOT DETECTED.  The SARS-CoV-2 RNA is generally detectable in upper and lower respiratory specimens during the acute phase of infection. The lowest concentration of SARS-CoV-2 viral copies this assay can detect is 250 copies / mL. A negative result does not preclude SARS-CoV-2 infection and should not be used as the sole basis for treatment or other patient management decisions.  A negative result may occur with improper specimen collection / handling, submission of specimen other than nasopharyngeal swab, presence of viral mutation(s) within the areas targeted by this assay, and inadequate number of viral copies (<250 copies / mL). A negative result must be combined with clinical observations, patient history, and epidemiological information.  Fact Sheet for Patients:   08/27/19  Fact Sheet for Healthcare Providers: https://pope.com/  This test is not yet approved or  cleared by the [place] FDA and has been authorized for detection and/or diagnosis of SARS-CoV-2 by FDA under an Emergency Use Authorization (EUA).  This EUA will remain in effect (meaning this test can be used) for the duration of the COVID-19 declaration under Section 564(b)(1) of the Act, 21 U.S.C. section 360bbb-3(b)(1), unless the authorization is terminated or revoked sooner.  Performed at [facility], [address] [place], [place] [address]      Radiological Exams on Admission: DG Chest Portable 1 View  Result Date: 08/25/2019 CLINICAL DATA:  Lower extremity edema EXAM: PORTABLE CHEST 1 VIEW COMPARISON:  None.  FINDINGS: There is a an equivocal left pleural effusion. Lungs elsewhere are clear. There is cardiomegaly with pulmonary vascularity normal. No adenopathy. There is aortic atherosclerosis. No bone lesions. IMPRESSION: Cardiomegaly. Equivocal small left pleural effusion. Lungs otherwise clear. No adenopathy. Aortic Atherosclerosis (ICD10-I70.0). Electronically Signed   By: Bretta Bang III M.D.   On: 08/25/2019 11:47   ECHOCARDIOGRAM COMPLETE  Result Date: 08/25/2019    ECHOCARDIOGRAM REPORT   Patient Name:   GEAN LAROSE Date of Exam: 08/25/2019 Medical Rec #:  [id number]   Height:       73.0 in Accession #:    [id number]  Weight:       155.0 lb Date of Birth:  09-04-1950    BSA:          1.931 m Patient Age:    69 years    BP:           145/109 mmHg Patient Gender: M           HR:           124 bpm. Exam Location:  [facility] Procedure: 2D Echo, Color Doppler and Cardiac Doppler Indications:     I51.7 Cardiomegaly  History:         Patient has no prior history of Echocardiogram examinations.                  Risk Factors:Current Smoker.  Sonographer:     Humphrey Rolls RDCS (AE) Referring Phys:  [id number] Lennon Alstrom Diagnosing Phys: Julien Nordmann MD IMPRESSIONS  1. Left ventricular ejection fraction, by estimation, is 20 to 25%. The left ventricle has severely decreased function. The left ventricle has no regional wall motion abnormalities. The left ventricular internal cavity size was moderately dilated. Indeterminate diastolic filling due to E-A fusion.  2. Right ventricular systolic function is mildly reduced. The right ventricular size is mildly enlarged. There is severely elevated pulmonary artery systolic pressure. The estimated right ventricular systolic pressure is 72.1 mmHg.  3. Left atrial size was moderately dilated.  4. Right atrial size was moderately dilated.  5. Moderate mitral valve regurgitation.  6. Tricuspid valve regurgitation is moderate.  7. Aortic valve regurgitation is mild to moderate.  stenosis.  8. The inferior vena cava is dilated in size with <50% respiratory variability, suggesting right atrial pressure of 15 mmHg. FINDINGS  Left Ventricle: Left ventricular ejection fraction, by estimation, is 20 to 25%. The left ventricle has severely decreased function. The left ventricle has no regional wall motion abnormalities. The left ventricular internal cavity size was moderately dilated. There is no left ventricular hypertrophy. Indeterminate diastolic filling due to E-A fusion. Right Ventricle: The right ventricular size is mildly enlarged. No increase in right ventricular wall thickness. Right ventricular systolic function is mildly  reduced. There is severely elevated pulmonary artery systolic pressure. The tricuspid regurgitant velocity is 3.61 m/s, and with an assumed right atrial pressure of 20 mmHg, the estimated right ventricular systolic pressure is 72.1 mmHg. Left Atrium: Left atrial size was moderately dilated. Right Atrium: Right atrial size was moderately dilated. Pericardium: There is no evidence of pericardial effusion. Mitral Valve: The mitral valve is normal in structure. Normal mobility of the mitral valve leaflets. Moderate mitral valve regurgitation. No evidence of mitral valve stenosis. MV peak gradient, 5.0 mmHg. The mean mitral valve gradient is 2.0 mmHg. Tricuspid Valve: The tricuspid valve is normal in structure. Tricuspid valve regurgitation is moderate . No evidence of tricuspid stenosis. Aortic Valve: The aortic valve is normal in structure. Aortic valve regurgitation is mild to moderate. Aortic regurgitation PHT measures 220 msec. Mild to moderate aortic valve sclerosis/calcification is present, without any evidence of aortic stenosis. Aortic valve mean gradient measures 3.0 mmHg. Aortic valve peak gradient measures 5.4 mmHg. Aortic valve area, by VTI measures 1.97 cm. Pulmonic Valve: The pulmonic valve was normal in structure. Pulmonic valve regurgitation is not  visualized. No evidence of pulmonic stenosis. Aorta: The aortic root is normal in size and structure. Venous: The inferior vena cava is dilated in size with less than 50% respiratory variability, suggesting right atrial pressure of 15 mmHg. IAS/Shunts: No atrial level shunt detected by color flow Doppler.  LEFT VENTRICLE PLAX 2D LVIDd:         5.49 cm      Diastology LVIDs:         5.14 cm      LV e' lateral:   11.70 cm/s LV PW:         1.31 cm      LV E/e' lateral: 8.2 LV IVS:        0.98 cm      LV e' medial:    9.25 cm/s LVOT diam:     2.20 cm      LV E/e' medial:  10.3 LV SV:         26 LV SV Index:   14 LVOT Area:     3.80 cm  LV Volumes (MOD) LV vol d, MOD A2C: 136.0 ml LV vol d, MOD A4C: 150.0 ml LV vol s, MOD A2C: 99.9 ml LV vol s, MOD A4C: 106.0 ml LV SV MOD A2C:     36.1 ml LV SV MOD A4C:     150.0 ml LV SV MOD BP:      41.8 ml RIGHT VENTRICLE RV Basal diam:  3.59 cm LEFT ATRIUM              Index       RIGHT ATRIUM           Index LA diam:        4.00 cm  2.07 cm/m  RA Area:     27.20 cm LA Vol (A2C):   117.0 ml 60.60 ml/m RA Volume:   94.60 ml  49.00 ml/m LA Vol (A4C):   69.9 ml  36.20 ml/m LA Biplane Vol: 96.1 ml  49.77 ml/m  AORTIC VALVE                   PULMONIC VALVE AV Area (Vmax):    2.23 cm    PV Vmax:       0.65 m/s AV Area (Vmean):   1.80 cm    PV Vmean:      37.700 cm/s  AV Area (VTI):     1.97 cm    PV VTI:        0.056 m AV Vmax:           116.00 cm/s PV Peak grad:  1.7 mmHg AV Vmean:          86.000 cm/s PV Mean grad:  1.0 mmHg AV VTI:            0.134 m AV Peak Grad:      5.4 mmHg AV Mean Grad:      3.0 mmHg LVOT Vmax:         67.90 cm/s LVOT Vmean:        40.800 cm/s LVOT VTI:          0.069 m LVOT/AV VTI ratio: 0.52 AI PHT:            220 msec  AORTA Ao Root diam: 3.40 cm MITRAL VALVE               TRICUSPID VALVE MV Area (PHT): 6.05 cm    TR Peak grad:   52.1 mmHg MV Peak grad:  5.0 mmHg    TR Vmax:        361.00 cm/s MV Mean grad:  2.0 mmHg MV Vmax:       1.12 m/s    SHUNTS  MV Vmean:      61.8 cm/s   Systemic VTI:  0.07 m MV Decel Time: 125 msec    Systemic Diam: 2.20 cm MV E velocity: 95.53 cm/s Julien Nordmann MD Electronically signed by Julien Nordmann MD Signature Date/Time: 08/25/2019/4:09:12 PM    Final      EKG: Independently reviewed.  Sinus rhythm, tachycardia, QTC 600, poor R wave progression  Assessment/Plan Principal Problem:   Acute CHF (congestive heart failure) (HCC) Active Problems:   Abnormal LFTs   Thrombocytopenia (HCC)   Tobacco abuse   Elevated troponin   Sinus tachycardia   Alcohol use   Acute CHF (congestive heart failure) [facility]): Patient has bilateral leg edema, elevated BNP 1054, chest x-ray with cardiomegaly and small left pleural effusion, clinically consistent service acute CHF.  No 2D echo on record, not sure which type of CHF.  Will get 2D echo for further evaluation.  Dr. Mariah Milling of cardiology is consulted.  Cardiologist recommended to get D-dimer to rule out PE.  D-dimer 3885.   -Place to progressive unit for observation -Lasix 20 mg bid by IV (patient received 40 mg of Lasix in ED) -Started lisinopril 2.5 mg daily -Start aspirin 81 mg daily -trend trop -2d echo -Daily weights -strict I/O's -Low salt diet -Fluid restriction -Obtain REDs Vest reading -f/u CTA to r/o PE and LE doppler to r/o DVT due to positive D-dimer  Elevated troponin: Troponin 92.  Currently no chest pain, likely due to demand ischemia secondary to acute CHF - Trend Trop - Repeat EKG in the am  - aspirin - Risk factor stratification: will check FLP and A1C  - check UDS - f/u 2d echo  Abnormal LFTs: Likely due to alcohol use and liver congestion secondary to acute CHF -check hepatitis panel and HIV antibody -Avoid using Tylenol  Thrombocytopenia (HCC): Likely due to alcohol use -Check LDH and peripheral smear  Tobacco abuse and alcohol use: -Nicotine patch -CIWA protocol  Sinus tachycardia: Heart rate up to 120-130 -Started metoprolol 25  mg twice daily per card         DVT ppx: SQ Lovenox Code Status: Full  code Family Communication: not done, no family member is at bed side.  Disposition Plan:  Anticipate discharge back to previous environment Consults called:  Dr. Mariah Milling of card Admission status:   progressive unit for obs    Status is: Observation  The patient remains OBS appropriate and will d/c before 2 midnights.  Dispo: The patient is from: Home              Anticipated d/c is to: Home              Anticipated d/c date is: 1 day              Patient currently is not medically stable to d/c.           Date of Service 08/25/2019    Lorretta Harp [facility]   If 7PM-7AM, please contact night-coverage [email or web address] 08/25/2019, 5:29 PM

## 2019-08-25 NOTE — ED Notes (Signed)
Cardiologist at bedside.  

## 2019-08-26 ENCOUNTER — Observation Stay: Payer: Medicare Other

## 2019-08-26 DIAGNOSIS — I420 Dilated cardiomyopathy: Secondary | ICD-10-CM | POA: Diagnosis present

## 2019-08-26 DIAGNOSIS — D696 Thrombocytopenia, unspecified: Secondary | ICD-10-CM | POA: Diagnosis not present

## 2019-08-26 DIAGNOSIS — Z832 Family history of diseases of the blood and blood-forming organs and certain disorders involving the immune mechanism: Secondary | ICD-10-CM | POA: Diagnosis not present

## 2019-08-26 DIAGNOSIS — R7401 Elevation of levels of liver transaminase levels: Secondary | ICD-10-CM

## 2019-08-26 DIAGNOSIS — Z20822 Contact with and (suspected) exposure to covid-19: Secondary | ICD-10-CM | POA: Diagnosis present

## 2019-08-26 DIAGNOSIS — K761 Chronic passive congestion of liver: Secondary | ICD-10-CM | POA: Diagnosis present

## 2019-08-26 DIAGNOSIS — Z880 Allergy status to penicillin: Secondary | ICD-10-CM | POA: Diagnosis not present

## 2019-08-26 DIAGNOSIS — I248 Other forms of acute ischemic heart disease: Secondary | ICD-10-CM | POA: Diagnosis present

## 2019-08-26 DIAGNOSIS — M549 Dorsalgia, unspecified: Secondary | ICD-10-CM | POA: Diagnosis present

## 2019-08-26 DIAGNOSIS — R7989 Other specified abnormal findings of blood chemistry: Secondary | ICD-10-CM | POA: Diagnosis present

## 2019-08-26 DIAGNOSIS — D6959 Other secondary thrombocytopenia: Secondary | ICD-10-CM | POA: Diagnosis present

## 2019-08-26 DIAGNOSIS — R945 Abnormal results of liver function studies: Secondary | ICD-10-CM | POA: Diagnosis present

## 2019-08-26 DIAGNOSIS — I428 Other cardiomyopathies: Secondary | ICD-10-CM | POA: Diagnosis not present

## 2019-08-26 DIAGNOSIS — R21 Rash and other nonspecific skin eruption: Secondary | ICD-10-CM | POA: Diagnosis present

## 2019-08-26 DIAGNOSIS — I959 Hypotension, unspecified: Secondary | ICD-10-CM | POA: Diagnosis not present

## 2019-08-26 DIAGNOSIS — Z720 Tobacco use: Secondary | ICD-10-CM | POA: Diagnosis not present

## 2019-08-26 DIAGNOSIS — I2510 Atherosclerotic heart disease of native coronary artery without angina pectoris: Secondary | ICD-10-CM | POA: Diagnosis present

## 2019-08-26 DIAGNOSIS — I479 Paroxysmal tachycardia, unspecified: Secondary | ICD-10-CM | POA: Diagnosis present

## 2019-08-26 DIAGNOSIS — I2584 Coronary atherosclerosis due to calcified coronary lesion: Secondary | ICD-10-CM | POA: Diagnosis present

## 2019-08-26 DIAGNOSIS — E876 Hypokalemia: Secondary | ICD-10-CM | POA: Diagnosis not present

## 2019-08-26 DIAGNOSIS — I25118 Atherosclerotic heart disease of native coronary artery with other forms of angina pectoris: Secondary | ICD-10-CM

## 2019-08-26 DIAGNOSIS — G8929 Other chronic pain: Secondary | ICD-10-CM | POA: Diagnosis present

## 2019-08-26 DIAGNOSIS — I77810 Thoracic aortic ectasia: Secondary | ICD-10-CM | POA: Diagnosis present

## 2019-08-26 DIAGNOSIS — I700 Atherosclerosis of aorta: Secondary | ICD-10-CM | POA: Diagnosis present

## 2019-08-26 DIAGNOSIS — I509 Heart failure, unspecified: Secondary | ICD-10-CM

## 2019-08-26 DIAGNOSIS — F17210 Nicotine dependence, cigarettes, uncomplicated: Secondary | ICD-10-CM | POA: Diagnosis present

## 2019-08-26 DIAGNOSIS — I5021 Acute systolic (congestive) heart failure: Secondary | ICD-10-CM | POA: Diagnosis present

## 2019-08-26 DIAGNOSIS — I272 Pulmonary hypertension, unspecified: Secondary | ICD-10-CM | POA: Diagnosis present

## 2019-08-26 DIAGNOSIS — R609 Edema, unspecified: Secondary | ICD-10-CM | POA: Diagnosis not present

## 2019-08-26 DIAGNOSIS — R778 Other specified abnormalities of plasma proteins: Secondary | ICD-10-CM | POA: Diagnosis not present

## 2019-08-26 DIAGNOSIS — I110 Hypertensive heart disease with heart failure: Secondary | ICD-10-CM | POA: Diagnosis present

## 2019-08-26 LAB — BASIC METABOLIC PANEL
Anion gap: 16 — ABNORMAL HIGH (ref 5–15)
BUN: 21 mg/dL (ref 8–23)
CO2: 27 mmol/L (ref 22–32)
Calcium: 9.3 mg/dL (ref 8.9–10.3)
Chloride: 95 mmol/L — ABNORMAL LOW (ref 98–111)
Creatinine, Ser: 1.22 mg/dL (ref 0.61–1.24)
GFR calc Af Amer: >60 mL/min (ref >60)
GFR calc non Af Amer: 60 mL/min — ABNORMAL LOW (ref >60)
Glucose, Bld: 85 mg/dL (ref 70–99)
Potassium: 3.6 mmol/L (ref 3.5–5.1)
Sodium: 138 mmol/L (ref 135–145)

## 2019-08-26 LAB — LIPID PANEL
Cholesterol: 192 mg/dL (ref 0–200)
HDL: 72 mg/dL (ref >40)
LDL Cholesterol: 104 mg/dL — ABNORMAL HIGH (ref 0–99)
Total CHOL/HDL Ratio: 2.7 RATIO
Triglycerides: 82 mg/dL (ref <150)
VLDL: 16 mg/dL (ref 0–40)

## 2019-08-26 LAB — HEMOGLOBIN A1C
Hgb A1c MFr Bld: 4.9 % (ref 4.8–5.6)
Mean Plasma Glucose: 93.93 mg/dL

## 2019-08-26 LAB — MAGNESIUM: Magnesium: 1.7 mg/dL (ref 1.7–2.4)

## 2019-08-26 LAB — CBC
HCT: 44.3 % (ref 39.0–52.0)
Hemoglobin: 15.3 g/dL (ref 13.0–17.0)
MCH: 32.6 pg (ref 26.0–34.0)
MCHC: 34.5 g/dL (ref 30.0–36.0)
MCV: 94.3 fL (ref 80.0–100.0)
Platelets: 132 10*3/uL — ABNORMAL LOW (ref 150–400)
RBC: 4.7 MIL/uL (ref 4.22–5.81)
RDW: 21.6 % — ABNORMAL HIGH (ref 11.5–15.5)
WBC: 4.8 10*3/uL (ref 4.0–10.5)
nRBC: 1.9 % — ABNORMAL HIGH (ref 0.0–0.2)

## 2019-08-26 LAB — TSH: TSH: 3.981 u[IU]/mL (ref 0.350–4.500)

## 2019-08-26 MED ORDER — NICOTINE 21 MG/24HR TD PT24
21.0000 mg | MEDICATED_PATCH | Freq: Every day | TRANSDERMAL | Status: DC
  Administered 2019-08-26 – 2019-08-28 (×3): 21 mg via TRANSDERMAL
  Filled 2019-08-26 (×4): qty 1

## 2019-08-26 NOTE — Progress Notes (Signed)
Curtis Walters notified of abnormal ECG reading. Labs ordered and will continue to monitor.

## 2019-08-26 NOTE — Progress Notes (Signed)
Progress Note  Patient Name: Curtis Walters Date of Encounter: 08/26/2019  [facility] Cardiologist: new to [facility]  Subjective   Reports that he feels well this morning, some urination, not as much as he was hoping for Able to sleep okay Still with leg swelling Discussed CT scan findings showing significant coronary calcification LAD distribution, EKG concerning for old anterior MI, right pleural effusion on CT scan  Inpatient Medications    Scheduled Meds: . aspirin EC  81 mg Oral Daily  . enoxaparin (LOVENOX) injection  40 mg Subcutaneous Q24H  . folic acid  1 mg Oral Daily  . furosemide  40 mg Intravenous Q8H  . LORazepam  0-4 mg Intravenous Q6H   Followed by  . [START ON 08/27/2019] LORazepam  0-4 mg Intravenous Q12H  . metoprolol succinate  25 mg Oral Daily  . multivitamin with minerals  1 tablet Oral Daily  . nicotine  21 mg Transdermal Once  . sacubitril-valsartan  1 tablet Oral BID  . sodium chloride flush  3 mL Intravenous Q12H  . thiamine  100 mg Oral Daily   Or  . thiamine  100 mg Intravenous Daily   Continuous Infusions: . sodium chloride     PRN Meds: sodium chloride, albuterol, dextromethorphan-guaiFENesin, hydrALAZINE, ibuprofen, LORazepam **OR** LORazepam, sodium chloride flush   Vital Signs    Vitals:   08/26/19 0615 08/26/19 0631 08/26/19 0917 08/26/19 1157  BP: 136/90  (!) 128/91 122/82  Pulse: (!) 111  (!) 110 (!) 113  Resp: 11 14    Temp: 97.7 F (36.5 C)  98 F (36.7 C) 98.2 F (36.8 C)  TempSrc: Oral  Oral Oral  SpO2: 100%  99% 99%  Weight:      Height:        Intake/Output Summary (Last 24 hours) at 08/26/2019 1224 Last data filed at 08/26/2019 0950 Gross per 24 hour  Intake 950 ml  Output 540 ml  Net 410 ml   Last 3 Weights 08/26/2019 08/25/2019 08/25/2019  Weight (lbs) 142 lb 1.6 oz 147 lb 1.6 oz 155 lb  Weight (kg) 64.456 kg 66.724 kg 70.308 kg      Telemetry    Normal sinus rhythm, no arrhythmia recorded- Personally  Reviewed  ECG     - Personally Reviewed  Physical Exam   GEN: No acute distress.   Neck: No JVD Cardiac: RRR, no murmurs, rubs, or gallops.  1+ pitting lower extremity edema to the low shins Respiratory: Clear to auscultation bilaterally. GI: Soft, nontender, non-distended  MS: No edema; No deformity. Neuro:  Nonfocal  Psych: Normal affect   Labs    High Sensitivity Troponin:   Recent Labs  Lab 08/25/19 1256 08/25/19 1549 08/25/19 1720 08/25/19 2157  TROPONINIHS 92* 72* 71* 90*      Chemistry Recent Labs  Lab 08/25/19 0957 08/26/19 0607  NA 137 138  K 3.9 3.6  CL 100 95*  CO2 22 27  GLUCOSE 97 85  BUN 17 21  CREATININE 1.00 1.22  CALCIUM 9.1 9.3  PROT 6.9  --   ALBUMIN 3.8  --   AST 120*  --   ALT 61*  --   ALKPHOS 45  --   BILITOT 2.0*  --   GFRNONAA >60 60*  GFRAA >60 >60  ANIONGAP 15 16*     Hematology Recent Labs  Lab 08/25/19 0957 08/26/19 0607  WBC 5.5 4.8  RBC 4.38 4.70  HGB 14.0 15.3  HCT 40.0 44.3  MCV 91.3 94.3  MCH 32.0 32.6  MCHC 35.0 34.5  RDW 21.7* 21.6*  PLT 116* 132*    BNP Recent Labs  Lab 08/25/19 0957  BNP 1,505.4*     DDimer No results for input(s): DDIMER in the last 168 hours.   Radiology    CT ANGIO CHEST PE W OR WO CONTRAST  Result Date: 08/25/2019 CLINICAL DATA:  Shortness of breath, positive D-dimer EXAM: CT ANGIOGRAPHY CHEST WITH CONTRAST TECHNIQUE: Multidetector CT imaging of the chest was performed using the standard protocol during bolus administration of intravenous contrast. Multiplanar CT image reconstructions and MIPs were obtained to evaluate the vascular anatomy. CONTRAST:  5mL OMNIPAQUE IOHEXOL 350 MG/ML SOLN COMPARISON:  None. FINDINGS: Cardiovascular: Cardiomegaly. Scattered aortic and moderate coronary artery calcifications. No filling defects in the pulmonary arteries to suggest pulmonary emboli. Mediastinum/Nodes: No mediastinal, hilar, or axillary adenopathy. Trachea and esophagus are  unremarkable. Thyroid unremarkable. Lungs/Pleura: Emphysema. Moderate right pleural effusion. No confluent opacities. Upper Abdomen: Small amount of perihepatic and perisplenic ascites. Reflux of contrast into the IVC and hepatic veins compatible with right heart dysfunction. Musculoskeletal: Chest wall soft tissues are unremarkable. No acute bony abnormality. Review of the MIP images confirms the above findings. IMPRESSION: No evidence of pulmonary embolus. Cardiomegaly, coronary artery disease. Moderate right pleural effusion. Aortic Atherosclerosis (ICD10-I70.0) and Emphysema (ICD10-J43.9). Electronically Signed   By: Charlett Nose M.D.   On: 08/25/2019 18:02   US Venous Img Lower Bilateral (DVT)  Result Date: 08/26/2019 CLINICAL DATA:  Positive D-dimer and shortness of breath. EXAM: BILATERAL LOWER EXTREMITY VENOUS DOPPLER ULTRASOUND TECHNIQUE: Gray-scale sonography with graded compression, as well as color Doppler and duplex ultrasound were performed to evaluate the lower extremity deep venous systems from the level of the common femoral vein and including the common femoral, femoral, profunda femoral, popliteal and calf veins including the posterior tibial, peroneal and gastrocnemius veins when visible. The superficial great saphenous vein was also interrogated. Spectral Doppler was utilized to evaluate flow at rest and with distal augmentation maneuvers in the common femoral, femoral and popliteal veins. COMPARISON:  None. FINDINGS: RIGHT LOWER EXTREMITY Common Femoral Vein: No evidence of thrombus. Normal compressibility, respiratory phasicity and response to augmentation. Saphenofemoral Junction: No evidence of thrombus. Normal compressibility and flow on color Doppler imaging. Profunda Femoral Vein: No evidence of thrombus. Normal compressibility and flow on color Doppler imaging. Femoral Vein: No evidence of thrombus. Normal compressibility, respiratory phasicity and response to augmentation. Popliteal  Vein: No evidence of thrombus. Normal compressibility, respiratory phasicity and response to augmentation. Calf Veins: No evidence of thrombus. Normal compressibility and flow on color Doppler imaging. Superficial Great Saphenous Vein: No evidence of thrombus. Normal compressibility. Venous Reflux:  None. Other Findings: No evidence of superficial thrombophlebitis or abnormal fluid collection. LEFT LOWER EXTREMITY Common Femoral Vein: No evidence of thrombus. Normal compressibility, respiratory phasicity and response to augmentation. Saphenofemoral Junction: No evidence of thrombus. Normal compressibility and flow on color Doppler imaging. Profunda Femoral Vein: No evidence of thrombus. Normal compressibility and flow on color Doppler imaging. Femoral Vein: No evidence of thrombus. Normal compressibility, respiratory phasicity and response to augmentation. Popliteal Vein: No evidence of thrombus. Normal compressibility, respiratory phasicity and response to augmentation. Calf Veins: No evidence of thrombus. Normal compressibility and flow on color Doppler imaging. Superficial Great Saphenous Vein: No evidence of thrombus. Normal compressibility. Venous Reflux:  None. Other Findings: No evidence of superficial thrombophlebitis or abnormal fluid collection. IMPRESSION: No evidence of deep venous thrombosis in either lower extremity.  Electronically Signed   By: Irish Lack M.D.   On: 08/26/2019 08:49   DG Chest Portable 1 View  Result Date: 08/25/2019 CLINICAL DATA:  Lower extremity edema EXAM: PORTABLE CHEST 1 VIEW COMPARISON:  None. FINDINGS: There is a an equivocal left pleural effusion. Lungs elsewhere are clear. There is cardiomegaly with pulmonary vascularity normal. No adenopathy. There is aortic atherosclerosis. No bone lesions. IMPRESSION: Cardiomegaly. Equivocal small left pleural effusion. Lungs otherwise clear. No adenopathy. Aortic Atherosclerosis (ICD10-I70.0). Electronically Signed   By: Bretta Bang III M.D.   On: 08/25/2019 11:47   ECHOCARDIOGRAM COMPLETE  Result Date: 08/25/2019    ECHOCARDIOGRAM REPORT   Patient Name:   Curtis Walters Date of Exam: 08/25/2019 Medical Rec #:  [id number]   Height:       73.0 in Accession #:    [id number]  Weight:       155.0 lb Date of Birth:  03-08-1950    BSA:          1.931 m Patient Age:    69 years    BP:           145/109 mmHg Patient Gender: M           HR:           124 bpm. Exam Location:  [facility] Procedure: 2D Echo, Color Doppler and Cardiac Doppler Indications:     I51.7 Cardiomegaly  History:         Patient has no prior history of Echocardiogram examinations.                  Risk Factors:Current Smoker.  Sonographer:     Humphrey Rolls RDCS (AE) Referring Phys:  [id number] Lennon Alstrom Diagnosing Phys: Julien Nordmann MD IMPRESSIONS  1. Left ventricular ejection fraction, by estimation, is 20 to 25%. The left ventricle has severely decreased function. The left ventricle has no regional wall motion abnormalities. The left ventricular internal cavity size was moderately dilated. Indeterminate diastolic filling due to E-A fusion.  2. Right ventricular systolic function is mildly reduced. The right ventricular size is mildly enlarged. There is severely elevated pulmonary artery systolic pressure. The estimated right ventricular systolic pressure is 72.1 mmHg.  3. Left atrial size was moderately dilated.  4. Right atrial size was moderately dilated.  5. Moderate mitral valve regurgitation.  6. Tricuspid valve regurgitation is moderate.  7. Aortic valve regurgitation is mild to moderate. stenosis.  8. The inferior vena cava is dilated in size with <50% respiratory variability, suggesting right atrial pressure of 15 mmHg. FINDINGS  Left Ventricle: Left ventricular ejection fraction, by estimation, is 20 to 25%. The left ventricle has severely decreased function. The left ventricle has no regional wall motion abnormalities. The left ventricular internal cavity size was  moderately dilated. There is no left ventricular hypertrophy. Indeterminate diastolic filling due to E-A fusion. Right Ventricle: The right ventricular size is mildly enlarged. No increase in right ventricular wall thickness. Right ventricular systolic function is mildly reduced. There is severely elevated pulmonary artery systolic pressure. The tricuspid regurgitant velocity is 3.61 m/s, and with an assumed right atrial pressure of 20 mmHg, the estimated right ventricular systolic pressure is 72.1 mmHg. Left Atrium: Left atrial size was moderately dilated. Right Atrium: Right atrial size was moderately dilated. Pericardium: There is no evidence of pericardial effusion. Mitral Valve: The mitral valve is normal in structure. Normal mobility of the mitral valve leaflets. Moderate mitral valve regurgitation. No evidence of mitral  valve stenosis. MV peak gradient, 5.0 mmHg. The mean mitral valve gradient is 2.0 mmHg. Tricuspid Valve: The tricuspid valve is normal in structure. Tricuspid valve regurgitation is moderate . No evidence of tricuspid stenosis. Aortic Valve: The aortic valve is normal in structure. Aortic valve regurgitation is mild to moderate. Aortic regurgitation PHT measures 220 msec. Mild to moderate aortic valve sclerosis/calcification is present, without any evidence of aortic stenosis. Aortic valve mean gradient measures 3.0 mmHg. Aortic valve peak gradient measures 5.4 mmHg. Aortic valve area, by VTI measures 1.97 cm. Pulmonic Valve: The pulmonic valve was normal in structure. Pulmonic valve regurgitation is not visualized. No evidence of pulmonic stenosis. Aorta: The aortic root is normal in size and structure. Venous: The inferior vena cava is dilated in size with less than 50% respiratory variability, suggesting right atrial pressure of 15 mmHg. IAS/Shunts: No atrial level shunt detected by color flow Doppler.  LEFT VENTRICLE PLAX 2D LVIDd:         5.49 cm      Diastology LVIDs:         5.14 cm       LV e' lateral:   11.70 cm/s LV PW:         1.31 cm      LV E/e' lateral: 8.2 LV IVS:        0.98 cm      LV e' medial:    9.25 cm/s LVOT diam:     2.20 cm      LV E/e' medial:  10.3 LV SV:         26 LV SV Index:   14 LVOT Area:     3.80 cm  LV Volumes (MOD) LV vol d, MOD A2C: 136.0 ml LV vol d, MOD A4C: 150.0 ml LV vol s, MOD A2C: 99.9 ml LV vol s, MOD A4C: 106.0 ml LV SV MOD A2C:     36.1 ml LV SV MOD A4C:     150.0 ml LV SV MOD BP:      41.8 ml RIGHT VENTRICLE RV Basal diam:  3.59 cm LEFT ATRIUM              Index       RIGHT ATRIUM           Index LA diam:        4.00 cm  2.07 cm/m  RA Area:     27.20 cm LA Vol (A2C):   117.0 ml 60.60 ml/m RA Volume:   94.60 ml  49.00 ml/m LA Vol (A4C):   69.9 ml  36.20 ml/m LA Biplane Vol: 96.1 ml  49.77 ml/m  AORTIC VALVE                   PULMONIC VALVE AV Area (Vmax):    2.23 cm    PV Vmax:       0.65 m/s AV Area (Vmean):   1.80 cm    PV Vmean:      37.700 cm/s AV Area (VTI):     1.97 cm    PV VTI:        0.056 m AV Vmax:           116.00 cm/s PV Peak grad:  1.7 mmHg AV Vmean:          86.000 cm/s PV Mean grad:  1.0 mmHg AV VTI:            0.134 m AV Peak Grad:  5.4 mmHg AV Mean Grad:      3.0 mmHg LVOT Vmax:         67.90 cm/s LVOT Vmean:        40.800 cm/s LVOT VTI:          0.069 m LVOT/AV VTI ratio: 0.52 AI PHT:            220 msec  AORTA Ao Root diam: 3.40 cm MITRAL VALVE               TRICUSPID VALVE MV Area (PHT): 6.05 cm    TR Peak grad:   52.1 mmHg MV Peak grad:  5.0 mmHg    TR Vmax:        361.00 cm/s MV Mean grad:  2.0 mmHg MV Vmax:       1.12 m/s    SHUNTS MV Vmean:      61.8 cm/s   Systemic VTI:  0.07 m MV Decel Time: 125 msec    Systemic Diam: 2.20 cm MV E velocity: 95.53 cm/s Julien Nordmann MD Electronically signed by Julien Nordmann MD Signature Date/Time: 08/25/2019/4:09:12 PM    Final     Cardiac Studies   Echocardiogram performed yesterday 1. Left ventricular ejection fraction, by estimation, is 20 to 25%. The  left ventricle has  severely decreased function. The left ventricle has no  regional wall motion abnormalities. The left ventricular internal cavity  size was moderately dilated.  Indeterminate diastolic filling due to E-A fusion.  2. Right ventricular systolic function is mildly reduced. The right  ventricular size is mildly enlarged. There is severely elevated pulmonary  artery systolic pressure. The estimated right ventricular systolic  pressure is 72.1 mmHg.  3. Left atrial size was moderately dilated.  4. Right atrial size was moderately dilated.  5. Moderate mitral valve regurgitation.  6. Tricuspid valve regurgitation is moderate.  7. Aortic valve regurgitation is mild to moderate. stenosis.  8. The inferior vena cava is dilated in size with <50% respiratory  variability, suggesting right atrial pressure of 15 mmHg.     Patient Profile     Mr. Curtis Walters is a 69 year old gentleman with a long history of smoking, chronic back pain, walks with a cane, presenting with worsening shortness of breath, leg swelling Echo with severely depressed LV function 20%, abnormal EKG, CT scan with heavy coronary calcification LAD territory, acute systolic CHF  Assessment & Plan   Acute systolic CHF Ejection fraction estimated 20%, global hypokinesis Concerning for ischemic cardiomyopathy given CT scan finding with heavy coronary calcification LAD territory, heavy smoking history -Unable to exclude hypertensive heart disease, he denies alcohol -Started on low-dose Entresto, low-dose metoprolol succinate -Still with significant sinus tachycardia secondary to low output state, cardiomyopathy -Lasix up to 40 IV every 8.  Still with significant leg edema, also right pleural effusion noted on CT scan consistent with CHF --- Recommend continue IV Lasix,  metoprolol succinate daily, continue Entresto.  Slow increase in dosing possibly tomorrow as blood pressure tolerates with aggressive diuresis -We have discussed  with him that he should consider right and left heart catheterization on Monday  Dilated cardiomyopathy Concerning for ischemic cardiomyopathy given long smoking history and CT scan findings with heavy coronary calcification -Aspirin, statin Goal LDL less than 70 We will start Crestor 10 mg daily, can be titrated upwards slowly with careful monitoring of LFTs  Smoker We have encouraged him to continue to work on weaning his cigarettes and smoking cessation. He will continue to  work on this and does not want any assistance with chantix.    Transaminitis likely secondary to hepatic congestion and, elevated total bilirubin and AST He denies heavy alcohol  Aortic atherosclerosis Statin as above Mildly dilated ascending aorta 3.9 cm  periodic imaging on annual basis  Long discussion with him concerning findings above and need to stay in the hospital for catheterization He was hoping to go home today, Stressed the need to stay given his tachycardia, continued swelling, concern for ischemic cardiomyopathy  Total encounter time more than 35 minutes  Greater than 50% was spent in counseling and coordination of care with the patient   For questions or updates, please contact [facility] Please consult [email or web address] for contact info under        Signed, Julien Nordmann, MD  08/26/2019, 12:24 PM

## 2019-08-26 NOTE — Progress Notes (Signed)
PROGRESS NOTE    Curtis Walters  EGB:[id number] DOB: 04/25/1950 DOA: 08/25/2019 PCP: Patient, No Pcp Per    Assessment & Plan:   Principal Problem:   Acute systolic CHF (congestive heart failure) (HCC) Active Problems:   Abnormal LFTs   Thrombocytopenia (HCC)   Tobacco abuse   Elevated troponin   Sinus tachycardia   Alcohol use   Dilated cardiomyopathy (HCC)   Acute systolic CHF exacerbation: unknown systolic vs diastolic vs mixed. Echo shows EF 20-25%, indeterminate function & no wall motion abnormalities. Continue on IV lasix. Monitor I/Os and daily weights. Continue on metoprolol, aspirin, lisinopril.   Elevated troponin: likely due to demand ischemia.   Transaminitis: likely due to alcohol use and liver congestion from CHF.  Thrombocytopenia: likely secondary to alcohol use. Will continue to monitor  Tobacco abuse: nicotine patch to prevent w/drawal   Alcohol use: CIWA protocol    DVT prophylaxis:lovenox Code Status: full  Family Communication: Disposition Plan: depends on PT/OT recs   Consultants:   cardio   Procedures:    Antimicrobials:   Subjective: Pt c/o shortness of breath   Objective: Vitals:   08/26/19 0201 08/26/19 0420 08/26/19 0615 08/26/19 0631  BP: (!) 135/95 (!) 136/109 136/90   Pulse: (!) 110 (!) 117 (!) 111   Resp: 11 20 11 14   Temp: 98.5 F (36.9 C) 97.9 F (36.6 C) 97.7 F (36.5 C)   TempSrc: Oral  Oral   SpO2: 100% 100% 100%   Weight:  64.5 kg    Height:        Intake/Output Summary (Last 24 hours) at 08/26/2019 0801 Last data filed at 08/26/2019 0151 Gross per 24 hour  Intake 360 ml  Output 540 ml  Net -180 ml   Filed Weights   08/25/19 0953 08/25/19 1708 08/26/19 0420  Weight: 70.3 kg 66.7 kg 64.5 kg    Examination:  General exam: Appears calm and comfortable  Respiratory system: diminished breath sounds b/l Cardiovascular system: S1 & S2 +. No  rubs, gallops or clicks. B/l  LE 2+ pitting edema    Gastrointestinal system: Abdomen is nondistended, soft and nontender. Normal bowel sounds heard. Central nervous system: Alert and oriented. Moves all 4 extremities  Psychiatry: Judgement and insight appear normal. Mood & affect appropriate.     Data Reviewed: I have personally reviewed following labs and imaging studies  CBC: Recent Labs  Lab 08/25/19 0957 08/26/19 0607  WBC 5.5 4.8  NEUTROABS 3.4  --   HGB 14.0 15.3  HCT 40.0 44.3  MCV 91.3 94.3  PLT 116* 132*   Basic Metabolic Panel: Recent Labs  Lab 08/25/19 0957 08/26/19 0607  NA 137 138  K 3.9 3.6  CL 100 95*  CO2 22 27  GLUCOSE 97 85  BUN 17 21  CREATININE 1.00 1.22  CALCIUM 9.1 9.3  MG  --  1.7   GFR: Estimated Creatinine Clearance: 51.4 mL/min (by C-G formula based on SCr of 1.22 mg/dL). Liver Function Tests: Recent Labs  Lab 08/25/19 0957  AST 120*  ALT 61*  ALKPHOS 45  BILITOT 2.0*  PROT 6.9  ALBUMIN 3.8   No results for input(s): LIPASE, AMYLASE in the last 168 hours. No results for input(s): AMMONIA in the last 168 hours. Coagulation Profile: No results for input(s): INR, PROTIME in the last 168 hours. Cardiac Enzymes: No results for input(s): CKTOTAL, CKMB, CKMBINDEX, TROPONINI in the last 168 hours. BNP (last 3 results) No results for input(s): PROBNP in the  last 8760 hours. HbA1C: No results for input(s): HGBA1C in the last 72 hours. CBG: No results for input(s): GLUCAP in the last 168 hours. Lipid Profile: Recent Labs    08/26/19 0607  CHOL 192  HDL 72  LDLCALC 104*  TRIG 82  CHOLHDL 2.7   Thyroid Function Tests: Recent Labs    08/26/19 0607  TSH 3.981   Anemia Panel: No results for input(s): VITAMINB12, FOLATE, FERRITIN, TIBC, IRON, RETICCTPCT in the last 72 hours. Sepsis Labs: No results for input(s): PROCALCITON, LATICACIDVEN in the last 168 hours.  Recent Results (from the past 240 hour(s))  SARS Coronavirus 2 by RT PCR (hospital order, performed in [facility]  hospital lab) Nasopharyngeal Nasopharyngeal Swab     Status: None   Collection Time: 08/25/19 12:29 PM   Specimen: Nasopharyngeal Swab  Result Value Ref Range Status   SARS Coronavirus 2 NEGATIVE NEGATIVE Final    Comment: (NOTE) SARS-CoV-2 target nucleic acids are NOT DETECTED.  The SARS-CoV-2 RNA is generally detectable in upper and lower respiratory specimens during the acute phase of infection. The lowest concentration of SARS-CoV-2 viral copies this assay can detect is 250 copies / mL. A negative result does not preclude SARS-CoV-2 infection and should not be used as the sole basis for treatment or other patient management decisions.  A negative result may occur with improper specimen collection / handling, submission of specimen other than nasopharyngeal swab, presence of viral mutation(s) within the areas targeted by this assay, and inadequate number of viral copies (<250 copies / mL). A negative result must be combined with clinical observations, patient history, and epidemiological information.  Fact Sheet for Patients:   BoilerBrush.com.cy  Fact Sheet for Healthcare Providers: https://pope.com/  This test is not yet approved or  cleared by the [place] FDA and has been authorized for detection and/or diagnosis of SARS-CoV-2 by FDA under an Emergency Use Authorization (EUA).  This EUA will remain in effect (meaning this test can be used) for the duration of the COVID-19 declaration under Section 564(b)(1) of the Act, 21 U.S.C. section 360bbb-3(b)(1), unless the authorization is terminated or revoked sooner.  Performed at [facility], [address] [place], [place] [address]          Radiology Studies: CT ANGIO CHEST PE W OR WO CONTRAST  Result Date: 08/25/2019 CLINICAL DATA:  Shortness of breath, positive D-dimer EXAM: CT ANGIOGRAPHY CHEST WITH CONTRAST TECHNIQUE: Multidetector CT imaging of the  chest was performed using the standard protocol during bolus administration of intravenous contrast. Multiplanar CT image reconstructions and MIPs were obtained to evaluate the vascular anatomy. CONTRAST:  74mL OMNIPAQUE IOHEXOL 350 MG/ML SOLN COMPARISON:  None. FINDINGS: Cardiovascular: Cardiomegaly. Scattered aortic and moderate coronary artery calcifications. No filling defects in the pulmonary arteries to suggest pulmonary emboli. Mediastinum/Nodes: No mediastinal, hilar, or axillary adenopathy. Trachea and esophagus are unremarkable. Thyroid unremarkable. Lungs/Pleura: Emphysema. Moderate right pleural effusion. No confluent opacities. Upper Abdomen: Small amount of perihepatic and perisplenic ascites. Reflux of contrast into the IVC and hepatic veins compatible with right heart dysfunction. Musculoskeletal: Chest wall soft tissues are unremarkable. No acute bony abnormality. Review of the MIP images confirms the above findings. IMPRESSION: No evidence of pulmonary embolus. Cardiomegaly, coronary artery disease. Moderate right pleural effusion. Aortic Atherosclerosis (ICD10-I70.0) and Emphysema (ICD10-J43.9). Electronically Signed   By: Charlett Nose M.D.   On: 08/25/2019 18:02   DG Chest Portable 1 View  Result Date: 08/25/2019 CLINICAL DATA:  Lower extremity edema EXAM: PORTABLE  CHEST 1 VIEW COMPARISON:  None. FINDINGS: There is a an equivocal left pleural effusion. Lungs elsewhere are clear. There is cardiomegaly with pulmonary vascularity normal. No adenopathy. There is aortic atherosclerosis. No bone lesions. IMPRESSION: Cardiomegaly. Equivocal small left pleural effusion. Lungs otherwise clear. No adenopathy. Aortic Atherosclerosis (ICD10-I70.0). Electronically Signed   By: Bretta Bang III M.D.   On: 08/25/2019 11:47   ECHOCARDIOGRAM COMPLETE  Result Date: 08/25/2019    ECHOCARDIOGRAM REPORT   Patient Name:   NASR DOOMS Date of Exam: 08/25/2019 Medical Rec #:  [id number]   Height:       73.0  in Accession #:    [id number]  Weight:       155.0 lb Date of Birth:  01-26-1950    BSA:          1.931 m Patient Age:    69 years    BP:           145/109 mmHg Patient Gender: M           HR:           124 bpm. Exam Location:  [facility] Procedure: 2D Echo, Color Doppler and Cardiac Doppler Indications:     I51.7 Cardiomegaly  History:         Patient has no prior history of Echocardiogram examinations.                  Risk Factors:Current Smoker.  Sonographer:     Humphrey Rolls RDCS (AE) Referring Phys:  [id number] Lennon Alstrom Diagnosing Phys: Julien Nordmann MD IMPRESSIONS  1. Left ventricular ejection fraction, by estimation, is 20 to 25%. The left ventricle has severely decreased function. The left ventricle has no regional wall motion abnormalities. The left ventricular internal cavity size was moderately dilated. Indeterminate diastolic filling due to E-A fusion.  2. Right ventricular systolic function is mildly reduced. The right ventricular size is mildly enlarged. There is severely elevated pulmonary artery systolic pressure. The estimated right ventricular systolic pressure is 72.1 mmHg.  3. Left atrial size was moderately dilated.  4. Right atrial size was moderately dilated.  5. Moderate mitral valve regurgitation.  6. Tricuspid valve regurgitation is moderate.  7. Aortic valve regurgitation is mild to moderate. stenosis.  8. The inferior vena cava is dilated in size with <50% respiratory variability, suggesting right atrial pressure of 15 mmHg. FINDINGS  Left Ventricle: Left ventricular ejection fraction, by estimation, is 20 to 25%. The left ventricle has severely decreased function. The left ventricle has no regional wall motion abnormalities. The left ventricular internal cavity size was moderately dilated. There is no left ventricular hypertrophy. Indeterminate diastolic filling due to E-A fusion. Right Ventricle: The right ventricular size is mildly enlarged. No increase in right ventricular wall  thickness. Right ventricular systolic function is mildly reduced. There is severely elevated pulmonary artery systolic pressure. The tricuspid regurgitant velocity is 3.61 m/s, and with an assumed right atrial pressure of 20 mmHg, the estimated right ventricular systolic pressure is 72.1 mmHg. Left Atrium: Left atrial size was moderately dilated. Right Atrium: Right atrial size was moderately dilated. Pericardium: There is no evidence of pericardial effusion. Mitral Valve: The mitral valve is normal in structure. Normal mobility of the mitral valve leaflets. Moderate mitral valve regurgitation. No evidence of mitral valve stenosis. MV peak gradient, 5.0 mmHg. The mean mitral valve gradient is 2.0 mmHg. Tricuspid Valve: The tricuspid valve is normal in structure. Tricuspid valve regurgitation is moderate . No evidence of  tricuspid stenosis. Aortic Valve: The aortic valve is normal in structure. Aortic valve regurgitation is mild to moderate. Aortic regurgitation PHT measures 220 msec. Mild to moderate aortic valve sclerosis/calcification is present, without any evidence of aortic stenosis. Aortic valve mean gradient measures 3.0 mmHg. Aortic valve peak gradient measures 5.4 mmHg. Aortic valve area, by VTI measures 1.97 cm. Pulmonic Valve: The pulmonic valve was normal in structure. Pulmonic valve regurgitation is not visualized. No evidence of pulmonic stenosis. Aorta: The aortic root is normal in size and structure. Venous: The inferior vena cava is dilated in size with less than 50% respiratory variability, suggesting right atrial pressure of 15 mmHg. IAS/Shunts: No atrial level shunt detected by color flow Doppler.  LEFT VENTRICLE PLAX 2D LVIDd:         5.49 cm      Diastology LVIDs:         5.14 cm      LV e' lateral:   11.70 cm/s LV PW:         1.31 cm      LV E/e' lateral: 8.2 LV IVS:        0.98 cm      LV e' medial:    9.25 cm/s LVOT diam:     2.20 cm      LV E/e' medial:  10.3 LV SV:         26 LV SV Index:    14 LVOT Area:     3.80 cm  LV Volumes (MOD) LV vol d, MOD A2C: 136.0 ml LV vol d, MOD A4C: 150.0 ml LV vol s, MOD A2C: 99.9 ml LV vol s, MOD A4C: 106.0 ml LV SV MOD A2C:     36.1 ml LV SV MOD A4C:     150.0 ml LV SV MOD BP:      41.8 ml RIGHT VENTRICLE RV Basal diam:  3.59 cm LEFT ATRIUM              Index       RIGHT ATRIUM           Index LA diam:        4.00 cm  2.07 cm/m  RA Area:     27.20 cm LA Vol (A2C):   117.0 ml 60.60 ml/m RA Volume:   94.60 ml  49.00 ml/m LA Vol (A4C):   69.9 ml  36.20 ml/m LA Biplane Vol: 96.1 ml  49.77 ml/m  AORTIC VALVE                   PULMONIC VALVE AV Area (Vmax):    2.23 cm    PV Vmax:       0.65 m/s AV Area (Vmean):   1.80 cm    PV Vmean:      37.700 cm/s AV Area (VTI):     1.97 cm    PV VTI:        0.056 m AV Vmax:           116.00 cm/s PV Peak grad:  1.7 mmHg AV Vmean:          86.000 cm/s PV Mean grad:  1.0 mmHg AV VTI:            0.134 m AV Peak Grad:      5.4 mmHg AV Mean Grad:      3.0 mmHg LVOT Vmax:         67.90 cm/s LVOT Vmean:  40.800 cm/s LVOT VTI:          0.069 m LVOT/AV VTI ratio: 0.52 AI PHT:            220 msec  AORTA Ao Root diam: 3.40 cm MITRAL VALVE               TRICUSPID VALVE MV Area (PHT): 6.05 cm    TR Peak grad:   52.1 mmHg MV Peak grad:  5.0 mmHg    TR Vmax:        361.00 cm/s MV Mean grad:  2.0 mmHg MV Vmax:       1.12 m/s    SHUNTS MV Vmean:      61.8 cm/s   Systemic VTI:  0.07 m MV Decel Time: 125 msec    Systemic Diam: 2.20 cm MV E velocity: 95.53 cm/s Julien Nordmann MD Electronically signed by Julien Nordmann MD Signature Date/Time: 08/25/2019/4:09:12 PM    Final         Scheduled Meds: . aspirin EC  81 mg Oral Daily  . enoxaparin (LOVENOX) injection  40 mg Subcutaneous Q24H  . folic acid  1 mg Oral Daily  . furosemide  40 mg Intravenous Q8H  . LORazepam  0-4 mg Intravenous Q6H   Followed by  . [START ON 08/27/2019] LORazepam  0-4 mg Intravenous Q12H  . metoprolol succinate  25 mg Oral Daily  . multivitamin with  minerals  1 tablet Oral Daily  . nicotine  21 mg Transdermal Once  . sacubitril-valsartan  1 tablet Oral BID  . sodium chloride flush  3 mL Intravenous Q12H  . thiamine  100 mg Oral Daily   Or  . thiamine  100 mg Intravenous Daily   Continuous Infusions: . sodium chloride       LOS: 0 days    Time spent: 33 mins   Charise Killian, MD [facility] Pager [phone]  If 7PM-7AM, please contact night-coverage [email or web address] 08/26/2019, 8:01 AM

## 2019-08-27 DIAGNOSIS — R7401 Elevation of levels of liver transaminase levels: Secondary | ICD-10-CM | POA: Diagnosis not present

## 2019-08-27 DIAGNOSIS — I5021 Acute systolic (congestive) heart failure: Secondary | ICD-10-CM | POA: Diagnosis not present

## 2019-08-27 DIAGNOSIS — R778 Other specified abnormalities of plasma proteins: Secondary | ICD-10-CM

## 2019-08-27 DIAGNOSIS — D696 Thrombocytopenia, unspecified: Secondary | ICD-10-CM | POA: Diagnosis not present

## 2019-08-27 LAB — BASIC METABOLIC PANEL
Anion gap: 17 — ABNORMAL HIGH (ref 5–15)
BUN: 28 mg/dL — ABNORMAL HIGH (ref 8–23)
CO2: 27 mmol/L (ref 22–32)
Calcium: 8.9 mg/dL (ref 8.9–10.3)
Chloride: 97 mmol/L — ABNORMAL LOW (ref 98–111)
Creatinine, Ser: 1.49 mg/dL — ABNORMAL HIGH (ref 0.61–1.24)
GFR calc Af Amer: 54 mL/min — ABNORMAL LOW (ref >60)
GFR calc non Af Amer: 47 mL/min — ABNORMAL LOW (ref >60)
Glucose, Bld: 96 mg/dL (ref 70–99)
Potassium: 3 mmol/L — ABNORMAL LOW (ref 3.5–5.1)
Sodium: 141 mmol/L (ref 135–145)

## 2019-08-27 LAB — CBC
HCT: 44.1 % (ref 39.0–52.0)
Hemoglobin: 15.4 g/dL (ref 13.0–17.0)
MCH: 32.2 pg (ref 26.0–34.0)
MCHC: 34.9 g/dL (ref 30.0–36.0)
MCV: 92.1 fL (ref 80.0–100.0)
Platelets: 122 10*3/uL — ABNORMAL LOW (ref 150–400)
RBC: 4.79 MIL/uL (ref 4.22–5.81)
RDW: 21 % — ABNORMAL HIGH (ref 11.5–15.5)
WBC: 4.7 10*3/uL (ref 4.0–10.5)
nRBC: 0.6 % — ABNORMAL HIGH (ref 0.0–0.2)

## 2019-08-27 MED ORDER — MAGNESIUM SULFATE 2 GM/50ML IV SOLN
2.0000 g | Freq: Once | INTRAVENOUS | Status: AC
  Administered 2019-08-27: 2 g via INTRAVENOUS
  Filled 2019-08-27: qty 50

## 2019-08-27 MED ORDER — FUROSEMIDE 10 MG/ML IJ SOLN: 20.0000 mg | Freq: Two times a day (BID) | INTRAMUSCULAR | Status: DC

## 2019-08-27 MED ORDER — ACETAMINOPHEN 325 MG PO TABS: 650.0000 mg | ORAL_TABLET | Freq: Four times a day (QID) | ORAL | Status: DC | PRN

## 2019-08-27 MED ORDER — TRAMADOL HCL 50 MG PO TABS
50.0000 mg | ORAL_TABLET | Freq: Four times a day (QID) | ORAL | Status: DC | PRN
  Administered 2019-08-27: 50 mg via ORAL
  Filled 2019-08-27: qty 1

## 2019-08-27 MED ORDER — POTASSIUM CHLORIDE CRYS ER 20 MEQ PO TBCR
40.0000 meq | EXTENDED_RELEASE_TABLET | Freq: Two times a day (BID) | ORAL | Status: AC
  Administered 2019-08-27 (×2): 40 meq via ORAL
  Filled 2019-08-27 (×2): qty 2

## 2019-08-27 NOTE — Progress Notes (Signed)
Physical Therapy Evaluation Patient Details Name: Curtis Walters MRN: [id number] DOB: 1950-08-27 Today's Date: 08/27/2019   History of Present Illness  Per MD order:Curtis Walters is a 69 y.o. male without significant past medical history except for tobacco abuse, alcohol use, who presents with leg edema and shortness of breath  Clinical Impression  Patient agrees to PT evaluation. He presents with MI for bed mobility, transfers and gait with spc 300 feet. He has WFL strength BLE and BUE and balance is WNL sitting and standing.  He has no deficits and needs no skilled follow up PT after DC from hospital.     Follow Up Recommendations No PT follow up    Equipment Recommendations       Recommendations for Other Services       Precautions / Restrictions Precautions Precautions: None Restrictions Weight Bearing Restrictions: No      Mobility  Bed Mobility Overal bed mobility: Independent                Transfers Overall transfer level: Modified independent Equipment used: Straight cane                Ambulation/Gait Ambulation/Gait assistance: Modified independent (Device/Increase time) Gait Distance (Feet): 300 Feet Assistive device: Straight cane Gait Pattern/deviations: Step-through pattern        Stairs            Wheelchair Mobility    Modified Rankin (Stroke Patients Only)       Balance Overall balance assessment: Independent                                           Pertinent Vitals/Pain Pain Assessment: No/denies pain    Home Living Family/patient expects to be discharged to:: Private residence Living Arrangements: Spouse/significant other     Home Access: Level entry     Home Layout: One level        Prior Function Level of Independence: Independent with assistive device(s)         Comments: spc     Hand Dominance        Extremity/Trunk Assessment   Upper Extremity Assessment Upper Extremity  Assessment: Overall WFL for tasks assessed    Lower Extremity Assessment Lower Extremity Assessment: Overall WFL for tasks assessed       Communication   Communication: No difficulties  Cognition Arousal/Alertness: Awake/alert Behavior During Therapy: WFL for tasks assessed/performed Overall Cognitive Status: Within Functional Limits for tasks assessed                                        General Comments      Exercises     Assessment/Plan    PT Assessment Patent does not need any further PT services  PT Problem List         PT Treatment Interventions      PT Goals (Current goals can be found in the Care Plan section)  Acute Rehab PT Goals Patient Stated Goal: to go home PT Goal Formulation: All assessment and education complete, DC therapy    Frequency     Barriers to discharge        Co-evaluation               AM-PAC PT "6 Clicks" Mobility  Outcome  Measure Help needed turning from your back to your side while in a flat bed without using bedrails?: None Help needed moving from lying on your back to sitting on the side of a flat bed without using bedrails?: None Help needed moving to and from a bed to a chair (including a wheelchair)?: None Help needed standing up from a chair using your arms (e.g., wheelchair or bedside chair)?: None Help needed to walk in hospital room?: None Help needed climbing 3-5 steps with a railing? : None 6 Click Score: 24    End of Session Equipment Utilized During Treatment: Gait belt Activity Tolerance: Patient tolerated treatment well Patient left: in bed Nurse Communication: Mobility status PT Visit Diagnosis: Difficulty in walking, not elsewhere classified (R26.2)    Time: 1330-1350 PT Time Calculation (min) (ACUTE ONLY): 20 min   Charges:   PT Evaluation $PT Eval Low Complexity: 1 Low PT Treatments $Gait Training: 8-22 mins          Ezekiel Ina, PT DPT 08/27/2019, 2:30  PM

## 2019-08-27 NOTE — Plan of Care (Signed)
  Problem: Education: Goal: Knowledge of General Education information will improve Description Including pain rating scale, medication(s)/side effects and non-pharmacologic comfort measures Outcome: Progressing   

## 2019-08-27 NOTE — H&P (View-Only) (Signed)
Progress Note  Patient Name: Curtis Walters Date of Encounter: 08/27/2019  [facility] Cardiologist: new to [facility]  Subjective   Feels well, leg swelling dramatically improved Ambulating, less short of breath Has not been recording urine output, has been flushing the toilet and not giving to nurse to measure  Again discussed CT scan findings, heavy coronary calcification, pleural effusion on the right  Inpatient Medications    Scheduled Meds: . aspirin EC  81 mg Oral Daily  . enoxaparin (LOVENOX) injection  40 mg Subcutaneous Q24H  . folic acid  1 mg Oral Daily  . furosemide  20 mg Intravenous BID  . LORazepam  0-4 mg Intravenous Q6H   Followed by  . LORazepam  0-4 mg Intravenous Q12H  . metoprolol succinate  25 mg Oral Daily  . multivitamin with minerals  1 tablet Oral Daily  . nicotine  21 mg Transdermal Daily  . potassium chloride  40 mEq Oral BID  . sacubitril-valsartan  1 tablet Oral BID  . sodium chloride flush  3 mL Intravenous Q12H  . thiamine  100 mg Oral Daily   Or  . thiamine  100 mg Intravenous Daily   Continuous Infusions: . sodium chloride     PRN Meds: sodium chloride, acetaminophen, albuterol, dextromethorphan-guaiFENesin, hydrALAZINE, ibuprofen, LORazepam **OR** LORazepam, sodium chloride flush, traMADol   Vital Signs    Vitals:   08/26/19 2027 08/27/19 0516 08/27/19 0759 08/27/19 1233  BP: 101/78 107/87 108/75 100/75  Pulse: (!) 110 (!) 105 (!) 107 (!) 104  Resp:      Temp: 97.7 F (36.5 C)  98.1 F (36.7 C) 97.7 F (36.5 C)  TempSrc: Oral  Oral Oral  SpO2: 98% 95% 93% 94%  Weight:  62 kg    Height:        Intake/Output Summary (Last 24 hours) at 08/27/2019 1308 Last data filed at 08/27/2019 1236 Gross per 24 hour  Intake 480 ml  Output 1000 ml  Net -520 ml   Last 3 Weights 08/27/2019 08/26/2019 08/25/2019  Weight (lbs) 136 lb 11.2 oz 142 lb 1.6 oz 147 lb 1.6 oz  Weight (kg) 62.007 kg 64.456 kg 66.724 kg      Telemetry      Normal sinus rhythm- Personally Reviewed  ECG     - Personally Reviewed  Physical Exam   Constitutional:  oriented to person, place, and time. No distress.  HENT:  Head: Normocephalic and atraumatic.  Eyes:  no discharge. No scleral icterus.  Neck: Normal range of motion. Neck supple. No JVD present.  Cardiovascular: Normal rate, regular rhythm, normal heart sounds and intact distal pulses. Exam reveals no gallop and no friction rub. No edema No murmur heard. Pulmonary/Chest: Effort normal and breath sounds normal. No stridor. No respiratory distress.  no wheezes.  no rales.  no tenderness.  Abdominal: Soft.  no distension.  no tenderness.  Musculoskeletal: Normal range of motion.  no  tenderness or deformity.  Neurological:  normal muscle tone. Coordination normal. No atrophy Skin: Skin is warm and dry. No rash noted. not diaphoretic.  Psychiatric:  normal mood and affect. behavior is normal. Thought content normal.    Labs    High Sensitivity Troponin:   Recent Labs  Lab 08/25/19 1256 08/25/19 1549 08/25/19 1720 08/25/19 2157  TROPONINIHS 92* 72* 71* 90*      Chemistry Recent Labs  Lab 08/25/19 0957 08/26/19 0607 08/27/19 0532  NA 137 138 141  K 3.9 3.6 3.0*  CL  100 95* 97*  CO2 22 27 27   GLUCOSE 97 85 96  BUN 17 21 28*  CREATININE 1.00 1.22 1.49*  CALCIUM 9.1 9.3 8.9  PROT 6.9  --   --   ALBUMIN 3.8  --   --   AST 120*  --   --   ALT 61*  --   --   ALKPHOS 45  --   --   BILITOT 2.0*  --   --   GFRNONAA >60 60* 47*  GFRAA >60 >60 54*  ANIONGAP 15 16* 17*     Hematology Recent Labs  Lab 08/25/19 0957 08/26/19 0607 08/27/19 0532  WBC 5.5 4.8 4.7  RBC 4.38 4.70 4.79  HGB 14.0 15.3 15.4  HCT 40.0 44.3 44.1  MCV 91.3 94.3 92.1  MCH 32.0 32.6 32.2  MCHC 35.0 34.5 34.9  RDW 21.7* 21.6* 21.0*  PLT 116* 132* 122*    BNP Recent Labs  Lab 08/25/19 0957  BNP 1,505.4*     DDimer No results for input(s): DDIMER in the last 168 hours.    Radiology    CT ANGIO CHEST PE W OR WO CONTRAST  Result Date: 08/25/2019 CLINICAL DATA:  Shortness of breath, positive D-dimer EXAM: CT ANGIOGRAPHY CHEST WITH CONTRAST TECHNIQUE: Multidetector CT imaging of the chest was performed using the standard protocol during bolus administration of intravenous contrast. Multiplanar CT image reconstructions and MIPs were obtained to evaluate the vascular anatomy. CONTRAST:  62mL OMNIPAQUE IOHEXOL 350 MG/ML SOLN COMPARISON:  None. FINDINGS: Cardiovascular: Cardiomegaly. Scattered aortic and moderate coronary artery calcifications. No filling defects in the pulmonary arteries to suggest pulmonary emboli. Mediastinum/Nodes: No mediastinal, hilar, or axillary adenopathy. Trachea and esophagus are unremarkable. Thyroid unremarkable. Lungs/Pleura: Emphysema. Moderate right pleural effusion. No confluent opacities. Upper Abdomen: Small amount of perihepatic and perisplenic ascites. Reflux of contrast into the IVC and hepatic veins compatible with right heart dysfunction. Musculoskeletal: Chest wall soft tissues are unremarkable. No acute bony abnormality. Review of the MIP images confirms the above findings. IMPRESSION: No evidence of pulmonary embolus. Cardiomegaly, coronary artery disease. Moderate right pleural effusion. Aortic Atherosclerosis (ICD10-I70.0) and Emphysema (ICD10-J43.9). Electronically Signed   By: 71m M.D.   On: 08/25/2019 18:02   08/27/2019 Venous Img Lower Bilateral (DVT)  Result Date: 08/26/2019 CLINICAL DATA:  Positive D-dimer and shortness of breath. EXAM: BILATERAL LOWER EXTREMITY VENOUS DOPPLER ULTRASOUND TECHNIQUE: Gray-scale sonography with graded compression, as well as color Doppler and duplex ultrasound were performed to evaluate the lower extremity deep venous systems from the level of the common femoral vein and including the common femoral, femoral, profunda femoral, popliteal and calf veins including the posterior tibial, peroneal and  gastrocnemius veins when visible. The superficial great saphenous vein was also interrogated. Spectral Doppler was utilized to evaluate flow at rest and with distal augmentation maneuvers in the common femoral, femoral and popliteal veins. COMPARISON:  None. FINDINGS: RIGHT LOWER EXTREMITY Common Femoral Vein: No evidence of thrombus. Normal compressibility, respiratory phasicity and response to augmentation. Saphenofemoral Junction: No evidence of thrombus. Normal compressibility and flow on color Doppler imaging. Profunda Femoral Vein: No evidence of thrombus. Normal compressibility and flow on color Doppler imaging. Femoral Vein: No evidence of thrombus. Normal compressibility, respiratory phasicity and response to augmentation. Popliteal Vein: No evidence of thrombus. Normal compressibility, respiratory phasicity and response to augmentation. Calf Veins: No evidence of thrombus. Normal compressibility and flow on color Doppler imaging. Superficial Great Saphenous Vein: No evidence of thrombus. Normal compressibility. Venous Reflux:  None. Other Findings: No evidence of superficial thrombophlebitis or abnormal fluid collection. LEFT LOWER EXTREMITY Common Femoral Vein: No evidence of thrombus. Normal compressibility, respiratory phasicity and response to augmentation. Saphenofemoral Junction: No evidence of thrombus. Normal compressibility and flow on color Doppler imaging. Profunda Femoral Vein: No evidence of thrombus. Normal compressibility and flow on color Doppler imaging. Femoral Vein: No evidence of thrombus. Normal compressibility, respiratory phasicity and response to augmentation. Popliteal Vein: No evidence of thrombus. Normal compressibility, respiratory phasicity and response to augmentation. Calf Veins: No evidence of thrombus. Normal compressibility and flow on color Doppler imaging. Superficial Great Saphenous Vein: No evidence of thrombus. Normal compressibility. Venous Reflux:  None. Other  Findings: No evidence of superficial thrombophlebitis or abnormal fluid collection. IMPRESSION: No evidence of deep venous thrombosis in either lower extremity. Electronically Signed   By: Irish Lack M.D.   On: 08/26/2019 08:49   ECHOCARDIOGRAM COMPLETE  Result Date: 08/25/2019    ECHOCARDIOGRAM REPORT   Patient Name:   CAS TRACZ Date of Exam: 08/25/2019 Medical Rec #:  [id number]   Height:       73.0 in Accession #:    [id number]  Weight:       155.0 lb Date of Birth:  10/05/1950    BSA:          1.931 m Patient Age:    69 years    BP:           145/109 mmHg Patient Gender: M           HR:           124 bpm. Exam Location:  [facility] Procedure: 2D Echo, Color Doppler and Cardiac Doppler Indications:     I51.7 Cardiomegaly  History:         Patient has no prior history of Echocardiogram examinations.                  Risk Factors:Current Smoker.  Sonographer:     Humphrey Rolls RDCS (AE) Referring Phys:  [id number] Lennon Alstrom Diagnosing Phys: Julien Nordmann MD IMPRESSIONS  1. Left ventricular ejection fraction, by estimation, is 20 to 25%. The left ventricle has severely decreased function. The left ventricle has no regional wall motion abnormalities. The left ventricular internal cavity size was moderately dilated. Indeterminate diastolic filling due to E-A fusion.  2. Right ventricular systolic function is mildly reduced. The right ventricular size is mildly enlarged. There is severely elevated pulmonary artery systolic pressure. The estimated right ventricular systolic pressure is 72.1 mmHg.  3. Left atrial size was moderately dilated.  4. Right atrial size was moderately dilated.  5. Moderate mitral valve regurgitation.  6. Tricuspid valve regurgitation is moderate.  7. Aortic valve regurgitation is mild to moderate. stenosis.  8. The inferior vena cava is dilated in size with <50% respiratory variability, suggesting right atrial pressure of 15 mmHg. FINDINGS  Left Ventricle: Left ventricular ejection  fraction, by estimation, is 20 to 25%. The left ventricle has severely decreased function. The left ventricle has no regional wall motion abnormalities. The left ventricular internal cavity size was moderately dilated. There is no left ventricular hypertrophy. Indeterminate diastolic filling due to E-A fusion. Right Ventricle: The right ventricular size is mildly enlarged. No increase in right ventricular wall thickness. Right ventricular systolic function is mildly reduced. There is severely elevated pulmonary artery systolic pressure. The tricuspid regurgitant velocity is 3.61 m/s, and with an assumed right atrial pressure of 20 mmHg, the estimated right ventricular  systolic pressure is 72.1 mmHg. Left Atrium: Left atrial size was moderately dilated. Right Atrium: Right atrial size was moderately dilated. Pericardium: There is no evidence of pericardial effusion. Mitral Valve: The mitral valve is normal in structure. Normal mobility of the mitral valve leaflets. Moderate mitral valve regurgitation. No evidence of mitral valve stenosis. MV peak gradient, 5.0 mmHg. The mean mitral valve gradient is 2.0 mmHg. Tricuspid Valve: The tricuspid valve is normal in structure. Tricuspid valve regurgitation is moderate . No evidence of tricuspid stenosis. Aortic Valve: The aortic valve is normal in structure. Aortic valve regurgitation is mild to moderate. Aortic regurgitation PHT measures 220 msec. Mild to moderate aortic valve sclerosis/calcification is present, without any evidence of aortic stenosis. Aortic valve mean gradient measures 3.0 mmHg. Aortic valve peak gradient measures 5.4 mmHg. Aortic valve area, by VTI measures 1.97 cm. Pulmonic Valve: The pulmonic valve was normal in structure. Pulmonic valve regurgitation is not visualized. No evidence of pulmonic stenosis. Aorta: The aortic root is normal in size and structure. Venous: The inferior vena cava is dilated in size with less than 50% respiratory variability,  suggesting right atrial pressure of 15 mmHg. IAS/Shunts: No atrial level shunt detected by color flow Doppler.  LEFT VENTRICLE PLAX 2D LVIDd:         5.49 cm      Diastology LVIDs:         5.14 cm      LV e' lateral:   11.70 cm/s LV PW:         1.31 cm      LV E/e' lateral: 8.2 LV IVS:        0.98 cm      LV e' medial:    9.25 cm/s LVOT diam:     2.20 cm      LV E/e' medial:  10.3 LV SV:         26 LV SV Index:   14 LVOT Area:     3.80 cm  LV Volumes (MOD) LV vol d, MOD A2C: 136.0 ml LV vol d, MOD A4C: 150.0 ml LV vol s, MOD A2C: 99.9 ml LV vol s, MOD A4C: 106.0 ml LV SV MOD A2C:     36.1 ml LV SV MOD A4C:     150.0 ml LV SV MOD BP:      41.8 ml RIGHT VENTRICLE RV Basal diam:  3.59 cm LEFT ATRIUM              Index       RIGHT ATRIUM           Index LA diam:        4.00 cm  2.07 cm/m  RA Area:     27.20 cm LA Vol (A2C):   117.0 ml 60.60 ml/m RA Volume:   94.60 ml  49.00 ml/m LA Vol (A4C):   69.9 ml  36.20 ml/m LA Biplane Vol: 96.1 ml  49.77 ml/m  AORTIC VALVE                   PULMONIC VALVE AV Area (Vmax):    2.23 cm    PV Vmax:       0.65 m/s AV Area (Vmean):   1.80 cm    PV Vmean:      37.700 cm/s AV Area (VTI):     1.97 cm    PV VTI:        0.056 m AV Vmax:  116.00 cm/s PV Peak grad:  1.7 mmHg AV Vmean:          86.000 cm/s PV Mean grad:  1.0 mmHg AV VTI:            0.134 m AV Peak Grad:      5.4 mmHg AV Mean Grad:      3.0 mmHg LVOT Vmax:         67.90 cm/s LVOT Vmean:        40.800 cm/s LVOT VTI:          0.069 m LVOT/AV VTI ratio: 0.52 AI PHT:            220 msec  AORTA Ao Root diam: 3.40 cm MITRAL VALVE               TRICUSPID VALVE MV Area (PHT): 6.05 cm    TR Peak grad:   52.1 mmHg MV Peak grad:  5.0 mmHg    TR Vmax:        361.00 cm/s MV Mean grad:  2.0 mmHg MV Vmax:       1.12 m/s    SHUNTS MV Vmean:      61.8 cm/s   Systemic VTI:  0.07 m MV Decel Time: 125 msec    Systemic Diam: 2.20 cm MV E velocity: 95.53 cm/s Julien Nordmann MD Electronically signed by Julien Nordmann MD Signature  Date/Time: 08/25/2019/4:09:12 PM    Final     Cardiac Studies   Echocardiogram performed yesterday 1. Left ventricular ejection fraction, by estimation, is 20 to 25%. The  left ventricle has severely decreased function. The left ventricle has no  regional wall motion abnormalities. The left ventricular internal cavity  size was moderately dilated.  Indeterminate diastolic filling due to E-A fusion.  2. Right ventricular systolic function is mildly reduced. The right  ventricular size is mildly enlarged. There is severely elevated pulmonary  artery systolic pressure. The estimated right ventricular systolic  pressure is 72.1 mmHg.  3. Left atrial size was moderately dilated.  4. Right atrial size was moderately dilated.  5. Moderate mitral valve regurgitation.  6. Tricuspid valve regurgitation is moderate.  7. Aortic valve regurgitation is mild to moderate. stenosis.  8. The inferior vena cava is dilated in size with <50% respiratory  variability, suggesting right atrial pressure of 15 mmHg.     Patient Profile     Mr. Karim Aiello is a 69 year old gentleman with a long history of smoking, chronic back pain, walks with a cane, presenting with worsening shortness of breath, leg swelling Echo with severely depressed LV function 20%, abnormal EKG, CT scan with heavy coronary calcification LAD territory, acute systolic CHF  Assessment & Plan   Acute systolic CHF Ejection fraction estimated 20%, global hypokinesis, new finding on echo -There is significant concern for ischemic cardiomyopathy given heavy calcification in the LAD, heavy smoking history He denies alcohol Unable to exclude hypertensive heart disease -He has been treated with Lasix 40 IV every 8 this admission -Now with climbing creatinine, will hold Lasix this evening in preparation for cardiac catheterization tomorrow I have reviewed the risks, indications, and alternatives to cardiac catheterization, possible  angioplasty, and stenting with the patient. Risks include but are not limited to bleeding, infection, vascular injury, stroke, myocardial infection, arrhythmia, kidney injury, radiation-related injury in the case of prolonged fluoroscopy use, emergency cardiac surgery, and death. The patient understands the risks of serious complication is 1-2 in 1000 with diagnostic cardiac cath and 1-2%  or less with angioplasty/stenting.  --He is willing to proceed with catheterization -Continue Entresto, metoprolol Given borderline low blood pressure, no changes to dosing  Dilated cardiomyopathy Long smoking history, unable to exclude ischemic cardiomyopathy -Plan for right and left heart catheterization tomorrow -He is aware, willing to proceed -On aspirin statin  Smoker Smoking cessation recommended   Transaminitis likely secondary to hepatic congestion, there is elevated total bilirubin and AST He denies heavy alcohol  Aortic atherosclerosis Mildly dilated ascending aorta 3.9 cm  periodic imaging on annual basis On aspirin statin  Was open to go home, stressed him the importance of procedure tomorrow to identify etiology of his cardiomyopathy Current medications discussed in detail  Total encounter time more than 35 minutes  Greater than 50% was spent in counseling and coordination of care with the patient   For questions or updates, please contact [facility] Please consult [email or web address] for contact info under        Signed, Julien Nordmann, MD  08/27/2019, 1:08 PM

## 2019-08-27 NOTE — Progress Notes (Signed)
Progress Note  Patient Name: Curtis Walters Date of Encounter: 08/27/2019  [facility] Cardiologist: new to [facility]  Subjective   Feels well, leg swelling dramatically improved Ambulating, less short of breath Has not been recording urine output, has been flushing the toilet and not giving to nurse to measure  Again discussed CT scan findings, heavy coronary calcification, pleural effusion on the right  Inpatient Medications    Scheduled Meds: . aspirin EC  81 mg Oral Daily  . enoxaparin (LOVENOX) injection  40 mg Subcutaneous Q24H  . folic acid  1 mg Oral Daily  . furosemide  20 mg Intravenous BID  . LORazepam  0-4 mg Intravenous Q6H   Followed by  . LORazepam  0-4 mg Intravenous Q12H  . metoprolol succinate  25 mg Oral Daily  . multivitamin with minerals  1 tablet Oral Daily  . nicotine  21 mg Transdermal Daily  . potassium chloride  40 mEq Oral BID  . sacubitril-valsartan  1 tablet Oral BID  . sodium chloride flush  3 mL Intravenous Q12H  . thiamine  100 mg Oral Daily   Or  . thiamine  100 mg Intravenous Daily   Continuous Infusions: . sodium chloride     PRN Meds: sodium chloride, acetaminophen, albuterol, dextromethorphan-guaiFENesin, hydrALAZINE, ibuprofen, LORazepam **OR** LORazepam, sodium chloride flush, traMADol   Vital Signs    Vitals:   08/26/19 2027 08/27/19 0516 08/27/19 0759 08/27/19 1233  BP: 101/78 107/87 108/75 100/75  Pulse: (!) 110 (!) 105 (!) 107 (!) 104  Resp:      Temp: 97.7 F (36.5 C)  98.1 F (36.7 C) 97.7 F (36.5 C)  TempSrc: Oral  Oral Oral  SpO2: 98% 95% 93% 94%  Weight:  62 kg    Height:        Intake/Output Summary (Last 24 hours) at 08/27/2019 1308 Last data filed at 08/27/2019 1236 Gross per 24 hour  Intake 480 ml  Output 1000 ml  Net -520 ml   Last 3 Weights 08/27/2019 08/26/2019 08/25/2019  Weight (lbs) 136 lb 11.2 oz 142 lb 1.6 oz 147 lb 1.6 oz  Weight (kg) 62.007 kg 64.456 kg 66.724 kg      Telemetry      Normal sinus rhythm- Personally Reviewed  ECG     - Personally Reviewed  Physical Exam   Constitutional:  oriented to person, place, and time. No distress.  HENT:  Head: Normocephalic and atraumatic.  Eyes:  no discharge. No scleral icterus.  Neck: Normal range of motion. Neck supple. No JVD present.  Cardiovascular: Normal rate, regular rhythm, normal heart sounds and intact distal pulses. Exam reveals no gallop and no friction rub. No edema No murmur heard. Pulmonary/Chest: Effort normal and breath sounds normal. No stridor. No respiratory distress.  no wheezes.  no rales.  no tenderness.  Abdominal: Soft.  no distension.  no tenderness.  Musculoskeletal: Normal range of motion.  no  tenderness or deformity.  Neurological:  normal muscle tone. Coordination normal. No atrophy Skin: Skin is warm and dry. No rash noted. not diaphoretic.  Psychiatric:  normal mood and affect. behavior is normal. Thought content normal.    Labs    High Sensitivity Troponin:   Recent Labs  Lab 08/25/19 1256 08/25/19 1549 08/25/19 1720 08/25/19 2157  TROPONINIHS 92* 72* 71* 90*      Chemistry Recent Labs  Lab 08/25/19 0957 08/26/19 0607 08/27/19 0532  NA 137 138 141  K 3.9 3.6 3.0*  CL  100 95* 97*  CO2 22 27 27   GLUCOSE 97 85 96  BUN 17 21 28*  CREATININE 1.00 1.22 1.49*  CALCIUM 9.1 9.3 8.9  PROT 6.9  --   --   ALBUMIN 3.8  --   --   AST 120*  --   --   ALT 61*  --   --   ALKPHOS 45  --   --   BILITOT 2.0*  --   --   GFRNONAA >60 60* 47*  GFRAA >60 >60 54*  ANIONGAP 15 16* 17*     Hematology Recent Labs  Lab 08/25/19 0957 08/26/19 0607 08/27/19 0532  WBC 5.5 4.8 4.7  RBC 4.38 4.70 4.79  HGB 14.0 15.3 15.4  HCT 40.0 44.3 44.1  MCV 91.3 94.3 92.1  MCH 32.0 32.6 32.2  MCHC 35.0 34.5 34.9  RDW 21.7* 21.6* 21.0*  PLT 116* 132* 122*    BNP Recent Labs  Lab 08/25/19 0957  BNP 1,505.4*     DDimer No results for input(s): DDIMER in the last 168 hours.    Radiology    CT ANGIO CHEST PE W OR WO CONTRAST  Result Date: 08/25/2019 CLINICAL DATA:  Shortness of breath, positive D-dimer EXAM: CT ANGIOGRAPHY CHEST WITH CONTRAST TECHNIQUE: Multidetector CT imaging of the chest was performed using the standard protocol during bolus administration of intravenous contrast. Multiplanar CT image reconstructions and MIPs were obtained to evaluate the vascular anatomy. CONTRAST:  62mL OMNIPAQUE IOHEXOL 350 MG/ML SOLN COMPARISON:  None. FINDINGS: Cardiovascular: Cardiomegaly. Scattered aortic and moderate coronary artery calcifications. No filling defects in the pulmonary arteries to suggest pulmonary emboli. Mediastinum/Nodes: No mediastinal, hilar, or axillary adenopathy. Trachea and esophagus are unremarkable. Thyroid unremarkable. Lungs/Pleura: Emphysema. Moderate right pleural effusion. No confluent opacities. Upper Abdomen: Small amount of perihepatic and perisplenic ascites. Reflux of contrast into the IVC and hepatic veins compatible with right heart dysfunction. Musculoskeletal: Chest wall soft tissues are unremarkable. No acute bony abnormality. Review of the MIP images confirms the above findings. IMPRESSION: No evidence of pulmonary embolus. Cardiomegaly, coronary artery disease. Moderate right pleural effusion. Aortic Atherosclerosis (ICD10-I70.0) and Emphysema (ICD10-J43.9). Electronically Signed   By: 71m M.D.   On: 08/25/2019 18:02   08/27/2019 Venous Img Lower Bilateral (DVT)  Result Date: 08/26/2019 CLINICAL DATA:  Positive D-dimer and shortness of breath. EXAM: BILATERAL LOWER EXTREMITY VENOUS DOPPLER ULTRASOUND TECHNIQUE: Gray-scale sonography with graded compression, as well as color Doppler and duplex ultrasound were performed to evaluate the lower extremity deep venous systems from the level of the common femoral vein and including the common femoral, femoral, profunda femoral, popliteal and calf veins including the posterior tibial, peroneal and  gastrocnemius veins when visible. The superficial great saphenous vein was also interrogated. Spectral Doppler was utilized to evaluate flow at rest and with distal augmentation maneuvers in the common femoral, femoral and popliteal veins. COMPARISON:  None. FINDINGS: RIGHT LOWER EXTREMITY Common Femoral Vein: No evidence of thrombus. Normal compressibility, respiratory phasicity and response to augmentation. Saphenofemoral Junction: No evidence of thrombus. Normal compressibility and flow on color Doppler imaging. Profunda Femoral Vein: No evidence of thrombus. Normal compressibility and flow on color Doppler imaging. Femoral Vein: No evidence of thrombus. Normal compressibility, respiratory phasicity and response to augmentation. Popliteal Vein: No evidence of thrombus. Normal compressibility, respiratory phasicity and response to augmentation. Calf Veins: No evidence of thrombus. Normal compressibility and flow on color Doppler imaging. Superficial Great Saphenous Vein: No evidence of thrombus. Normal compressibility. Venous Reflux:  None. Other Findings: No evidence of superficial thrombophlebitis or abnormal fluid collection. LEFT LOWER EXTREMITY Common Femoral Vein: No evidence of thrombus. Normal compressibility, respiratory phasicity and response to augmentation. Saphenofemoral Junction: No evidence of thrombus. Normal compressibility and flow on color Doppler imaging. Profunda Femoral Vein: No evidence of thrombus. Normal compressibility and flow on color Doppler imaging. Femoral Vein: No evidence of thrombus. Normal compressibility, respiratory phasicity and response to augmentation. Popliteal Vein: No evidence of thrombus. Normal compressibility, respiratory phasicity and response to augmentation. Calf Veins: No evidence of thrombus. Normal compressibility and flow on color Doppler imaging. Superficial Great Saphenous Vein: No evidence of thrombus. Normal compressibility. Venous Reflux:  None. Other  Findings: No evidence of superficial thrombophlebitis or abnormal fluid collection. IMPRESSION: No evidence of deep venous thrombosis in either lower extremity. Electronically Signed   By: Irish Lack M.D.   On: 08/26/2019 08:49   ECHOCARDIOGRAM COMPLETE  Result Date: 08/25/2019    ECHOCARDIOGRAM REPORT   Patient Name:   CAS TRACZ Date of Exam: 08/25/2019 Medical Rec #:  [id number]   Height:       73.0 in Accession #:    [id number]  Weight:       155.0 lb Date of Birth:  10/05/1950    BSA:          1.931 m Patient Age:    69 years    BP:           145/109 mmHg Patient Gender: M           HR:           124 bpm. Exam Location:  [facility] Procedure: 2D Echo, Color Doppler and Cardiac Doppler Indications:     I51.7 Cardiomegaly  History:         Patient has no prior history of Echocardiogram examinations.                  Risk Factors:Current Smoker.  Sonographer:     Humphrey Rolls RDCS (AE) Referring Phys:  [id number] Lennon Alstrom Diagnosing Phys: Julien Nordmann MD IMPRESSIONS  1. Left ventricular ejection fraction, by estimation, is 20 to 25%. The left ventricle has severely decreased function. The left ventricle has no regional wall motion abnormalities. The left ventricular internal cavity size was moderately dilated. Indeterminate diastolic filling due to E-A fusion.  2. Right ventricular systolic function is mildly reduced. The right ventricular size is mildly enlarged. There is severely elevated pulmonary artery systolic pressure. The estimated right ventricular systolic pressure is 72.1 mmHg.  3. Left atrial size was moderately dilated.  4. Right atrial size was moderately dilated.  5. Moderate mitral valve regurgitation.  6. Tricuspid valve regurgitation is moderate.  7. Aortic valve regurgitation is mild to moderate. stenosis.  8. The inferior vena cava is dilated in size with <50% respiratory variability, suggesting right atrial pressure of 15 mmHg. FINDINGS  Left Ventricle: Left ventricular ejection  fraction, by estimation, is 20 to 25%. The left ventricle has severely decreased function. The left ventricle has no regional wall motion abnormalities. The left ventricular internal cavity size was moderately dilated. There is no left ventricular hypertrophy. Indeterminate diastolic filling due to E-A fusion. Right Ventricle: The right ventricular size is mildly enlarged. No increase in right ventricular wall thickness. Right ventricular systolic function is mildly reduced. There is severely elevated pulmonary artery systolic pressure. The tricuspid regurgitant velocity is 3.61 m/s, and with an assumed right atrial pressure of 20 mmHg, the estimated right ventricular  systolic pressure is 72.1 mmHg. Left Atrium: Left atrial size was moderately dilated. Right Atrium: Right atrial size was moderately dilated. Pericardium: There is no evidence of pericardial effusion. Mitral Valve: The mitral valve is normal in structure. Normal mobility of the mitral valve leaflets. Moderate mitral valve regurgitation. No evidence of mitral valve stenosis. MV peak gradient, 5.0 mmHg. The mean mitral valve gradient is 2.0 mmHg. Tricuspid Valve: The tricuspid valve is normal in structure. Tricuspid valve regurgitation is moderate . No evidence of tricuspid stenosis. Aortic Valve: The aortic valve is normal in structure. Aortic valve regurgitation is mild to moderate. Aortic regurgitation PHT measures 220 msec. Mild to moderate aortic valve sclerosis/calcification is present, without any evidence of aortic stenosis. Aortic valve mean gradient measures 3.0 mmHg. Aortic valve peak gradient measures 5.4 mmHg. Aortic valve area, by VTI measures 1.97 cm. Pulmonic Valve: The pulmonic valve was normal in structure. Pulmonic valve regurgitation is not visualized. No evidence of pulmonic stenosis. Aorta: The aortic root is normal in size and structure. Venous: The inferior vena cava is dilated in size with less than 50% respiratory variability,  suggesting right atrial pressure of 15 mmHg. IAS/Shunts: No atrial level shunt detected by color flow Doppler.  LEFT VENTRICLE PLAX 2D LVIDd:         5.49 cm      Diastology LVIDs:         5.14 cm      LV e' lateral:   11.70 cm/s LV PW:         1.31 cm      LV E/e' lateral: 8.2 LV IVS:        0.98 cm      LV e' medial:    9.25 cm/s LVOT diam:     2.20 cm      LV E/e' medial:  10.3 LV SV:         26 LV SV Index:   14 LVOT Area:     3.80 cm  LV Volumes (MOD) LV vol d, MOD A2C: 136.0 ml LV vol d, MOD A4C: 150.0 ml LV vol s, MOD A2C: 99.9 ml LV vol s, MOD A4C: 106.0 ml LV SV MOD A2C:     36.1 ml LV SV MOD A4C:     150.0 ml LV SV MOD BP:      41.8 ml RIGHT VENTRICLE RV Basal diam:  3.59 cm LEFT ATRIUM              Index       RIGHT ATRIUM           Index LA diam:        4.00 cm  2.07 cm/m  RA Area:     27.20 cm LA Vol (A2C):   117.0 ml 60.60 ml/m RA Volume:   94.60 ml  49.00 ml/m LA Vol (A4C):   69.9 ml  36.20 ml/m LA Biplane Vol: 96.1 ml  49.77 ml/m  AORTIC VALVE                   PULMONIC VALVE AV Area (Vmax):    2.23 cm    PV Vmax:       0.65 m/s AV Area (Vmean):   1.80 cm    PV Vmean:      37.700 cm/s AV Area (VTI):     1.97 cm    PV VTI:        0.056 m AV Vmax:  116.00 cm/s PV Peak grad:  1.7 mmHg AV Vmean:          86.000 cm/s PV Mean grad:  1.0 mmHg AV VTI:            0.134 m AV Peak Grad:      5.4 mmHg AV Mean Grad:      3.0 mmHg LVOT Vmax:         67.90 cm/s LVOT Vmean:        40.800 cm/s LVOT VTI:          0.069 m LVOT/AV VTI ratio: 0.52 AI PHT:            220 msec  AORTA Ao Root diam: 3.40 cm MITRAL VALVE               TRICUSPID VALVE MV Area (PHT): 6.05 cm    TR Peak grad:   52.1 mmHg MV Peak grad:  5.0 mmHg    TR Vmax:        361.00 cm/s MV Mean grad:  2.0 mmHg MV Vmax:       1.12 m/s    SHUNTS MV Vmean:      61.8 cm/s   Systemic VTI:  0.07 m MV Decel Time: 125 msec    Systemic Diam: 2.20 cm MV E velocity: 95.53 cm/s Maricus Tanzi MD Electronically signed by Raymir Frommelt MD Signature  Date/Time: 08/25/2019/4:09:12 PM    Final     Cardiac Studies   Echocardiogram performed yesterday 1. Left ventricular ejection fraction, by estimation, is 20 to 25%. The  left ventricle has severely decreased function. The left ventricle has no  regional wall motion abnormalities. The left ventricular internal cavity  size was moderately dilated.  Indeterminate diastolic filling due to E-A fusion.  2. Right ventricular systolic function is mildly reduced. The right  ventricular size is mildly enlarged. There is severely elevated pulmonary  artery systolic pressure. The estimated right ventricular systolic  pressure is 72.1 mmHg.  3. Left atrial size was moderately dilated.  4. Right atrial size was moderately dilated.  5. Moderate mitral valve regurgitation.  6. Tricuspid valve regurgitation is moderate.  7. Aortic valve regurgitation is mild to moderate. stenosis.  8. The inferior vena cava is dilated in size with <50% respiratory  variability, suggesting right atrial pressure of 15 mmHg.     Patient Profile     Mr. Rollo Norsworthy is a 69-year-old gentleman with a long history of smoking, chronic back pain, walks with a cane, presenting with worsening shortness of breath, leg swelling Echo with severely depressed LV function 20%, abnormal EKG, CT scan with heavy coronary calcification LAD territory, acute systolic CHF  Assessment & Plan   Acute systolic CHF Ejection fraction estimated 20%, global hypokinesis, new finding on echo -There is significant concern for ischemic cardiomyopathy given heavy calcification in the LAD, heavy smoking history He denies alcohol Unable to exclude hypertensive heart disease -He has been treated with Lasix 40 IV every 8 this admission -Now with climbing creatinine, will hold Lasix this evening in preparation for cardiac catheterization tomorrow I have reviewed the risks, indications, and alternatives to cardiac catheterization, possible  angioplasty, and stenting with the patient. Risks include but are not limited to bleeding, infection, vascular injury, stroke, myocardial infection, arrhythmia, kidney injury, radiation-related injury in the case of prolonged fluoroscopy use, emergency cardiac surgery, and death. The patient understands the risks of serious complication is 1-2 in 1000 with diagnostic cardiac cath and 1-2%   or less with angioplasty/stenting.  --He is willing to proceed with catheterization -Continue Entresto, metoprolol Given borderline low blood pressure, no changes to dosing  Dilated cardiomyopathy Long smoking history, unable to exclude ischemic cardiomyopathy -Plan for right and left heart catheterization tomorrow -He is aware, willing to proceed -On aspirin statin  Smoker Smoking cessation recommended   Transaminitis likely secondary to hepatic congestion, there is elevated total bilirubin and AST He denies heavy alcohol  Aortic atherosclerosis Mildly dilated ascending aorta 3.9 cm  periodic imaging on annual basis On aspirin statin  Was open to go home, stressed him the importance of procedure tomorrow to identify etiology of his cardiomyopathy Current medications discussed in detail  Total encounter time more than 35 minutes  Greater than 50% was spent in counseling and coordination of care with the patient   For questions or updates, please contact [facility] Please consult [email or web address] for contact info under        Signed, Julien Nordmann, MD  08/27/2019, 1:08 PM

## 2019-08-27 NOTE — Progress Notes (Addendum)
PROGRESS NOTE    Curtis Walters  PVX:[id number] DOB: 31-Jul-1950 DOA: 08/25/2019 PCP: Patient, No Pcp Per    Assessment & Plan:   Principal Problem:   Acute systolic CHF (congestive heart failure) (HCC) Active Problems:   Abnormal LFTs   Thrombocytopenia (HCC)   Tobacco abuse   Elevated troponin   Sinus tachycardia   Alcohol use   Dilated cardiomyopathy (HCC)   Acute exacerbation of CHF (congestive heart failure) (HCC)   Acute systolic CHF exacerbation:  echo shows EF 20-25%, indeterminate function & no wall motion abnormalities. Continue on IV lasix. Monitor I/Os and daily weights. Continue on metoprolol, aspirin, lisinopril. Will likely go for cardiac cath   Elevated troponin: likely due to demand ischemia, will go for cardiac cath tomorrow   Hypokalemia: KCL repleted. Will continue to monitor   Transaminitis: likely due to alcohol use and liver congestion from CHF.  Thrombocytopenia: likely secondary to alcohol use. Will continue to monitor  Tobacco abuse: nicotine patch to prevent w/drawal   Alcohol use: CIWA protocol    DVT prophylaxis:lovenox Code Status: full  Family Communication: Disposition Plan: depends on PT/OT recs   Consultants:   cardio   Procedures:    Antimicrobials:   Subjective: Pt c/o malaise   Objective: Vitals:   08/26/19 1157 08/26/19 1900 08/26/19 2027 08/27/19 0516  BP: 122/82  101/78 107/87  Pulse: (!) 113 (!) 109 (!) 110 (!) 105  Resp:      Temp: 98.2 F (36.8 C)  97.7 F (36.5 C)   TempSrc: Oral  Oral   SpO2: 99%  98% 95%  Weight:    62 kg  Height:        Intake/Output Summary (Last 24 hours) at 08/27/2019 0757 Last data filed at 08/27/2019 0540 Gross per 24 hour  Intake 830 ml  Output 400 ml  Net 430 ml   Filed Weights   08/25/19 1708 08/26/19 0420 08/27/19 0516  Weight: 66.7 kg 64.5 kg 62 kg    Examination: General exam: Appears calm and comfortable  Respiratory system: diminished breath sounds  b/l Cardiovascular system: S1 & S2 +. No  rubs, gallops or clicks. B/l  LE 2+ pitting edema  Gastrointestinal system: Abdomen is nondistended, soft and nontender. Hypoactive bowel sounds heard. Central nervous system: Alert and oriented. Moves all 4 extremities  Psychiatry: Judgement and insight appear normal. Mood & affect appropriate.     Data Reviewed: I have personally reviewed following labs and imaging studies  CBC: Recent Labs  Lab 08/25/19 0957 08/26/19 0607 08/27/19 0532  WBC 5.5 4.8 4.7  NEUTROABS 3.4  --   --   HGB 14.0 15.3 15.4  HCT 40.0 44.3 44.1  MCV 91.3 94.3 92.1  PLT 116* 132* 122*   Basic Metabolic Panel: Recent Labs  Lab 08/25/19 0957 08/26/19 0607 08/27/19 0532  NA 137 138 141  K 3.9 3.6 3.0*  CL 100 95* 97*  CO2 22 27 27   GLUCOSE 97 85 96  BUN 17 21 28*  CREATININE 1.00 1.22 1.49*  CALCIUM 9.1 9.3 8.9  MG  --  1.7  --    GFR: Estimated Creatinine Clearance: 40.5 mL/min (A) (by C-G formula based on SCr of 1.49 mg/dL (H)). Liver Function Tests: Recent Labs  Lab 08/25/19 0957  AST 120*  ALT 61*  ALKPHOS 45  BILITOT 2.0*  PROT 6.9  ALBUMIN 3.8   No results for input(s): LIPASE, AMYLASE in the last 168 hours. No results for input(s): AMMONIA in  the last 168 hours. Coagulation Profile: No results for input(s): INR, PROTIME in the last 168 hours. Cardiac Enzymes: No results for input(s): CKTOTAL, CKMB, CKMBINDEX, TROPONINI in the last 168 hours. BNP (last 3 results) No results for input(s): PROBNP in the last 8760 hours. HbA1C: Recent Labs    08/26/19 0607  HGBA1C 4.9   CBG: No results for input(s): GLUCAP in the last 168 hours. Lipid Profile: Recent Labs    08/26/19 0607  CHOL 192  HDL 72  LDLCALC 104*  TRIG 82  CHOLHDL 2.7   Thyroid Function Tests: Recent Labs    08/26/19 0607  TSH 3.981   Anemia Panel: No results for input(s): VITAMINB12, FOLATE, FERRITIN, TIBC, IRON, RETICCTPCT in the last 72 hours. Sepsis  Labs: No results for input(s): PROCALCITON, LATICACIDVEN in the last 168 hours.  Recent Results (from the past 240 hour(s))  SARS Coronavirus 2 by RT PCR (hospital order, performed in [facility] hospital lab) Nasopharyngeal Nasopharyngeal Swab     Status: None   Collection Time: 08/25/19 12:29 PM   Specimen: Nasopharyngeal Swab  Result Value Ref Range Status   SARS Coronavirus 2 NEGATIVE NEGATIVE Final    Comment: (NOTE) SARS-CoV-2 target nucleic acids are NOT DETECTED.  The SARS-CoV-2 RNA is generally detectable in upper and lower respiratory specimens during the acute phase of infection. The lowest concentration of SARS-CoV-2 viral copies this assay can detect is 250 copies / mL. A negative result does not preclude SARS-CoV-2 infection and should not be used as the sole basis for treatment or other patient management decisions.  A negative result may occur with improper specimen collection / handling, submission of specimen other than nasopharyngeal swab, presence of viral mutation(s) within the areas targeted by this assay, and inadequate number of viral copies (<250 copies / mL). A negative result must be combined with clinical observations, patient history, and epidemiological information.  Fact Sheet for Patients:   BoilerBrush.com.cy  Fact Sheet for Healthcare Providers: https://pope.com/  This test is not yet approved or  cleared by the [place] FDA and has been authorized for detection and/or diagnosis of SARS-CoV-2 by FDA under an Emergency Use Authorization (EUA).  This EUA will remain in effect (meaning this test can be used) for the duration of the COVID-19 declaration under Section 564(b)(1) of the Act, 21 U.S.C. section 360bbb-3(b)(1), unless the authorization is terminated or revoked sooner.  Performed at [facility], [address] [place], [place] [address]          Radiology Studies: CT  ANGIO CHEST PE W OR WO CONTRAST  Result Date: 08/25/2019 CLINICAL DATA:  Shortness of breath, positive D-dimer EXAM: CT ANGIOGRAPHY CHEST WITH CONTRAST TECHNIQUE: Multidetector CT imaging of the chest was performed using the standard protocol during bolus administration of intravenous contrast. Multiplanar CT image reconstructions and MIPs were obtained to evaluate the vascular anatomy. CONTRAST:  38mL OMNIPAQUE IOHEXOL 350 MG/ML SOLN COMPARISON:  None. FINDINGS: Cardiovascular: Cardiomegaly. Scattered aortic and moderate coronary artery calcifications. No filling defects in the pulmonary arteries to suggest pulmonary emboli. Mediastinum/Nodes: No mediastinal, hilar, or axillary adenopathy. Trachea and esophagus are unremarkable. Thyroid unremarkable. Lungs/Pleura: Emphysema. Moderate right pleural effusion. No confluent opacities. Upper Abdomen: Small amount of perihepatic and perisplenic ascites. Reflux of contrast into the IVC and hepatic veins compatible with right heart dysfunction. Musculoskeletal: Chest wall soft tissues are unremarkable. No acute bony abnormality. Review of the MIP images confirms the above findings. IMPRESSION: No evidence of pulmonary embolus. Cardiomegaly, coronary  artery disease. Moderate right pleural effusion. Aortic Atherosclerosis (ICD10-I70.0) and Emphysema (ICD10-J43.9). Electronically Signed   By: Charlett Nose M.D.   On: 08/25/2019 18:02   US Venous Img Lower Bilateral (DVT)  Result Date: 08/26/2019 CLINICAL DATA:  Positive D-dimer and shortness of breath. EXAM: BILATERAL LOWER EXTREMITY VENOUS DOPPLER ULTRASOUND TECHNIQUE: Gray-scale sonography with graded compression, as well as color Doppler and duplex ultrasound were performed to evaluate the lower extremity deep venous systems from the level of the common femoral vein and including the common femoral, femoral, profunda femoral, popliteal and calf veins including the posterior tibial, peroneal and gastrocnemius veins  when visible. The superficial great saphenous vein was also interrogated. Spectral Doppler was utilized to evaluate flow at rest and with distal augmentation maneuvers in the common femoral, femoral and popliteal veins. COMPARISON:  None. FINDINGS: RIGHT LOWER EXTREMITY Common Femoral Vein: No evidence of thrombus. Normal compressibility, respiratory phasicity and response to augmentation. Saphenofemoral Junction: No evidence of thrombus. Normal compressibility and flow on color Doppler imaging. Profunda Femoral Vein: No evidence of thrombus. Normal compressibility and flow on color Doppler imaging. Femoral Vein: No evidence of thrombus. Normal compressibility, respiratory phasicity and response to augmentation. Popliteal Vein: No evidence of thrombus. Normal compressibility, respiratory phasicity and response to augmentation. Calf Veins: No evidence of thrombus. Normal compressibility and flow on color Doppler imaging. Superficial Great Saphenous Vein: No evidence of thrombus. Normal compressibility. Venous Reflux:  None. Other Findings: No evidence of superficial thrombophlebitis or abnormal fluid collection. LEFT LOWER EXTREMITY Common Femoral Vein: No evidence of thrombus. Normal compressibility, respiratory phasicity and response to augmentation. Saphenofemoral Junction: No evidence of thrombus. Normal compressibility and flow on color Doppler imaging. Profunda Femoral Vein: No evidence of thrombus. Normal compressibility and flow on color Doppler imaging. Femoral Vein: No evidence of thrombus. Normal compressibility, respiratory phasicity and response to augmentation. Popliteal Vein: No evidence of thrombus. Normal compressibility, respiratory phasicity and response to augmentation. Calf Veins: No evidence of thrombus. Normal compressibility and flow on color Doppler imaging. Superficial Great Saphenous Vein: No evidence of thrombus. Normal compressibility. Venous Reflux:  None. Other Findings: No evidence of  superficial thrombophlebitis or abnormal fluid collection. IMPRESSION: No evidence of deep venous thrombosis in either lower extremity. Electronically Signed   By: Irish Lack M.D.   On: 08/26/2019 08:49   DG Chest Portable 1 View  Result Date: 08/25/2019 CLINICAL DATA:  Lower extremity edema EXAM: PORTABLE CHEST 1 VIEW COMPARISON:  None. FINDINGS: There is a an equivocal left pleural effusion. Lungs elsewhere are clear. There is cardiomegaly with pulmonary vascularity normal. No adenopathy. There is aortic atherosclerosis. No bone lesions. IMPRESSION: Cardiomegaly. Equivocal small left pleural effusion. Lungs otherwise clear. No adenopathy. Aortic Atherosclerosis (ICD10-I70.0). Electronically Signed   By: Bretta Bang III M.D.   On: 08/25/2019 11:47   ECHOCARDIOGRAM COMPLETE  Result Date: 08/25/2019    ECHOCARDIOGRAM REPORT   Patient Name:   Curtis Walters Date of Exam: 08/25/2019 Medical Rec #:  [id number]   Height:       73.0 in Accession #:    [id number]  Weight:       155.0 lb Date of Birth:  06/01/1950    BSA:          1.931 m Patient Age:    69 years    BP:           145/109 mmHg Patient Gender: M           HR:  124 bpm. Exam Location:  [facility] Procedure: 2D Echo, Color Doppler and Cardiac Doppler Indications:     I51.7 Cardiomegaly  History:         Patient has no prior history of Echocardiogram examinations.                  Risk Factors:Current Smoker.  Sonographer:     Humphrey Rolls RDCS (AE) Referring Phys:  [id number] Lennon Alstrom Diagnosing Phys: Julien Nordmann MD IMPRESSIONS  1. Left ventricular ejection fraction, by estimation, is 20 to 25%. The left ventricle has severely decreased function. The left ventricle has no regional wall motion abnormalities. The left ventricular internal cavity size was moderately dilated. Indeterminate diastolic filling due to E-A fusion.  2. Right ventricular systolic function is mildly reduced. The right ventricular size is mildly enlarged. There is  severely elevated pulmonary artery systolic pressure. The estimated right ventricular systolic pressure is 72.1 mmHg.  3. Left atrial size was moderately dilated.  4. Right atrial size was moderately dilated.  5. Moderate mitral valve regurgitation.  6. Tricuspid valve regurgitation is moderate.  7. Aortic valve regurgitation is mild to moderate. stenosis.  8. The inferior vena cava is dilated in size with <50% respiratory variability, suggesting right atrial pressure of 15 mmHg. FINDINGS  Left Ventricle: Left ventricular ejection fraction, by estimation, is 20 to 25%. The left ventricle has severely decreased function. The left ventricle has no regional wall motion abnormalities. The left ventricular internal cavity size was moderately dilated. There is no left ventricular hypertrophy. Indeterminate diastolic filling due to E-A fusion. Right Ventricle: The right ventricular size is mildly enlarged. No increase in right ventricular wall thickness. Right ventricular systolic function is mildly reduced. There is severely elevated pulmonary artery systolic pressure. The tricuspid regurgitant velocity is 3.61 m/s, and with an assumed right atrial pressure of 20 mmHg, the estimated right ventricular systolic pressure is 72.1 mmHg. Left Atrium: Left atrial size was moderately dilated. Right Atrium: Right atrial size was moderately dilated. Pericardium: There is no evidence of pericardial effusion. Mitral Valve: The mitral valve is normal in structure. Normal mobility of the mitral valve leaflets. Moderate mitral valve regurgitation. No evidence of mitral valve stenosis. MV peak gradient, 5.0 mmHg. The mean mitral valve gradient is 2.0 mmHg. Tricuspid Valve: The tricuspid valve is normal in structure. Tricuspid valve regurgitation is moderate . No evidence of tricuspid stenosis. Aortic Valve: The aortic valve is normal in structure. Aortic valve regurgitation is mild to moderate. Aortic regurgitation PHT measures 220 msec.  Mild to moderate aortic valve sclerosis/calcification is present, without any evidence of aortic stenosis. Aortic valve mean gradient measures 3.0 mmHg. Aortic valve peak gradient measures 5.4 mmHg. Aortic valve area, by VTI measures 1.97 cm. Pulmonic Valve: The pulmonic valve was normal in structure. Pulmonic valve regurgitation is not visualized. No evidence of pulmonic stenosis. Aorta: The aortic root is normal in size and structure. Venous: The inferior vena cava is dilated in size with less than 50% respiratory variability, suggesting right atrial pressure of 15 mmHg. IAS/Shunts: No atrial level shunt detected by color flow Doppler.  LEFT VENTRICLE PLAX 2D LVIDd:         5.49 cm      Diastology LVIDs:         5.14 cm      LV e' lateral:   11.70 cm/s LV PW:         1.31 cm      LV E/e' lateral: 8.2 LV  IVS:        0.98 cm      LV e' medial:    9.25 cm/s LVOT diam:     2.20 cm      LV E/e' medial:  10.3 LV SV:         26 LV SV Index:   14 LVOT Area:     3.80 cm  LV Volumes (MOD) LV vol d, MOD A2C: 136.0 ml LV vol d, MOD A4C: 150.0 ml LV vol s, MOD A2C: 99.9 ml LV vol s, MOD A4C: 106.0 ml LV SV MOD A2C:     36.1 ml LV SV MOD A4C:     150.0 ml LV SV MOD BP:      41.8 ml RIGHT VENTRICLE RV Basal diam:  3.59 cm LEFT ATRIUM              Index       RIGHT ATRIUM           Index LA diam:        4.00 cm  2.07 cm/m  RA Area:     27.20 cm LA Vol (A2C):   117.0 ml 60.60 ml/m RA Volume:   94.60 ml  49.00 ml/m LA Vol (A4C):   69.9 ml  36.20 ml/m LA Biplane Vol: 96.1 ml  49.77 ml/m  AORTIC VALVE                   PULMONIC VALVE AV Area (Vmax):    2.23 cm    PV Vmax:       0.65 m/s AV Area (Vmean):   1.80 cm    PV Vmean:      37.700 cm/s AV Area (VTI):     1.97 cm    PV VTI:        0.056 m AV Vmax:           116.00 cm/s PV Peak grad:  1.7 mmHg AV Vmean:          86.000 cm/s PV Mean grad:  1.0 mmHg AV VTI:            0.134 m AV Peak Grad:      5.4 mmHg AV Mean Grad:      3.0 mmHg LVOT Vmax:         67.90 cm/s LVOT  Vmean:        40.800 cm/s LVOT VTI:          0.069 m LVOT/AV VTI ratio: 0.52 AI PHT:            220 msec  AORTA Ao Root diam: 3.40 cm MITRAL VALVE               TRICUSPID VALVE MV Area (PHT): 6.05 cm    TR Peak grad:   52.1 mmHg MV Peak grad:  5.0 mmHg    TR Vmax:        361.00 cm/s MV Mean grad:  2.0 mmHg MV Vmax:       1.12 m/s    SHUNTS MV Vmean:      61.8 cm/s   Systemic VTI:  0.07 m MV Decel Time: 125 msec    Systemic Diam: 2.20 cm MV E velocity: 95.53 cm/s Julien Nordmann MD Electronically signed by Julien Nordmann MD Signature Date/Time: 08/25/2019/4:09:12 PM    Final         Scheduled Meds: . aspirin EC  81 mg Oral Daily  . enoxaparin (LOVENOX) injection  40 mg Subcutaneous Q24H  . folic acid  1 mg Oral Daily  . furosemide  40 mg Intravenous Q8H  . LORazepam  0-4 mg Intravenous Q6H   Followed by  . LORazepam  0-4 mg Intravenous Q12H  . metoprolol succinate  25 mg Oral Daily  . multivitamin with minerals  1 tablet Oral Daily  . nicotine  21 mg Transdermal Daily  . sacubitril-valsartan  1 tablet Oral BID  . sodium chloride flush  3 mL Intravenous Q12H  . thiamine  100 mg Oral Daily   Or  . thiamine  100 mg Intravenous Daily   Continuous Infusions: . sodium chloride       LOS: 1 day    Time spent: 34 mins   Charise Killian, MD [facility] Pager [phone]  If 7PM-7AM, please contact night-coverage [email or web address] 08/27/2019, 7:57 AM

## 2019-08-28 ENCOUNTER — Encounter: Payer: Self-pay | Admitting: Cardiovascular Disease

## 2019-08-28 ENCOUNTER — Encounter
Admission: EM | Disposition: A | Discharge status: Home / Self Care | Payer: Self-pay | Source: Ambulatory Visit | Attending: Internal Medicine

## 2019-08-28 DIAGNOSIS — R7401 Elevation of levels of liver transaminase levels: Secondary | ICD-10-CM | POA: Diagnosis not present

## 2019-08-28 DIAGNOSIS — I5021 Acute systolic (congestive) heart failure: Secondary | ICD-10-CM | POA: Diagnosis not present

## 2019-08-28 DIAGNOSIS — I2510 Atherosclerotic heart disease of native coronary artery without angina pectoris: Secondary | ICD-10-CM

## 2019-08-28 DIAGNOSIS — D696 Thrombocytopenia, unspecified: Secondary | ICD-10-CM | POA: Diagnosis not present

## 2019-08-28 HISTORY — PX: RIGHT AND LEFT HEART CATH: CATH118262

## 2019-08-28 LAB — BASIC METABOLIC PANEL
Anion gap: 11 (ref 5–15)
BUN: 38 mg/dL — ABNORMAL HIGH (ref 8–23)
CO2: 28 mmol/L (ref 22–32)
Calcium: 9 mg/dL (ref 8.9–10.3)
Chloride: 101 mmol/L (ref 98–111)
Creatinine, Ser: 1.43 mg/dL — ABNORMAL HIGH (ref 0.61–1.24)
GFR calc Af Amer: 57 mL/min — ABNORMAL LOW (ref >60)
GFR calc non Af Amer: 49 mL/min — ABNORMAL LOW (ref >60)
Glucose, Bld: 128 mg/dL — ABNORMAL HIGH (ref 70–99)
Potassium: 4.3 mmol/L (ref 3.5–5.1)
Sodium: 140 mmol/L (ref 135–145)

## 2019-08-28 LAB — CBC
HCT: 41.4 % (ref 39.0–52.0)
Hemoglobin: 14.4 g/dL (ref 13.0–17.0)
MCH: 32.8 pg (ref 26.0–34.0)
MCHC: 34.8 g/dL (ref 30.0–36.0)
MCV: 94.3 fL (ref 80.0–100.0)
Platelets: 144 10*3/uL — ABNORMAL LOW (ref 150–400)
RBC: 4.39 MIL/uL (ref 4.22–5.81)
RDW: 21.1 % — ABNORMAL HIGH (ref 11.5–15.5)
WBC: 5.7 10*3/uL (ref 4.0–10.5)
nRBC: 0 % (ref 0.0–0.2)

## 2019-08-28 LAB — PATHOLOGIST SMEAR REVIEW

## 2019-08-28 LAB — MAGNESIUM: Magnesium: 1.9 mg/dL (ref 1.7–2.4)

## 2019-08-28 SURGERY — RIGHT AND LEFT HEART CATH
Anesthesia: Moderate Sedation

## 2019-08-28 MED ORDER — HEPARIN (PORCINE) IN NACL 1000-0.9 UT/500ML-% IV SOLN
INTRAVENOUS | Status: AC
  Filled 2019-08-28: qty 1000

## 2019-08-28 MED ORDER — LIDOCAINE HCL (PF) 1 % IJ SOLN
INTRAMUSCULAR | Status: AC
  Filled 2019-08-28: qty 30

## 2019-08-28 MED ORDER — SODIUM CHLORIDE 0.9 % IV SOLN: INTRAVENOUS | Status: DC

## 2019-08-28 MED ORDER — MIDAZOLAM HCL 2 MG/2ML IJ SOLN
INTRAMUSCULAR | Status: DC | PRN
  Administered 2019-08-28: 1 mg via INTRAVENOUS

## 2019-08-28 MED ORDER — FENTANYL CITRATE (PF) 100 MCG/2ML IJ SOLN
INTRAMUSCULAR | Status: DC | PRN
  Administered 2019-08-28: 25 ug via INTRAVENOUS

## 2019-08-28 MED ORDER — VERAPAMIL HCL 2.5 MG/ML IV SOLN
INTRAVENOUS | Status: DC | PRN
  Administered 2019-08-28: 2.5 mg via INTRAVENOUS

## 2019-08-28 MED ORDER — SODIUM CHLORIDE 0.9 % IV SOLN: 250.0000 mL | INTRAVENOUS | Status: DC | PRN

## 2019-08-28 MED ORDER — HEPARIN SODIUM (PORCINE) 1000 UNIT/ML IJ SOLN
INTRAMUSCULAR | Status: DC | PRN
  Administered 2019-08-28: 3000 [IU] via INTRAVENOUS

## 2019-08-28 MED ORDER — ASPIRIN 81 MG PO CHEW
81.0000 mg | CHEWABLE_TABLET | ORAL | Status: AC
  Administered 2019-08-28: 81 mg via ORAL

## 2019-08-28 MED ORDER — FUROSEMIDE 40 MG PO TABS
40.0000 mg | ORAL_TABLET | Freq: Every day | ORAL | Status: DC
  Administered 2019-08-29: 40 mg via ORAL
  Filled 2019-08-28: qty 1

## 2019-08-28 MED ORDER — MIDAZOLAM HCL 2 MG/2ML IJ SOLN
INTRAMUSCULAR | Status: AC
  Filled 2019-08-28: qty 2

## 2019-08-28 MED ORDER — IOHEXOL 300 MG/ML  SOLN
INTRAMUSCULAR | Status: DC | PRN
  Administered 2019-08-28: 35 mL

## 2019-08-28 MED ORDER — ASPIRIN 81 MG PO CHEW
81.0000 mg | CHEWABLE_TABLET | ORAL | Status: DC
Start: 2019-08-29 — End: 2019-08-28

## 2019-08-28 MED ORDER — FENTANYL CITRATE (PF) 100 MCG/2ML IJ SOLN
INTRAMUSCULAR | Status: AC
  Filled 2019-08-28: qty 2

## 2019-08-28 MED ORDER — VERAPAMIL HCL 2.5 MG/ML IV SOLN
INTRAVENOUS | Status: AC
  Filled 2019-08-28: qty 2

## 2019-08-28 MED ORDER — HEPARIN SODIUM (PORCINE) 1000 UNIT/ML IJ SOLN
INTRAMUSCULAR | Status: AC
  Filled 2019-08-28: qty 1

## 2019-08-28 MED ORDER — SODIUM CHLORIDE 0.9% FLUSH: 3.0000 mL | INTRAVENOUS | Status: DC | PRN

## 2019-08-28 MED ORDER — HEPARIN (PORCINE) IN NACL 1000-0.9 UT/500ML-% IV SOLN
INTRAVENOUS | Status: DC | PRN
  Administered 2019-08-28: 500 mL

## 2019-08-28 MED ORDER — ASPIRIN 81 MG PO CHEW
CHEWABLE_TABLET | ORAL | Status: AC
  Filled 2019-08-28: qty 1

## 2019-08-28 MED ORDER — SODIUM CHLORIDE 0.9% FLUSH
3.0000 mL | Freq: Two times a day (BID) | INTRAVENOUS | Status: DC
  Administered 2019-08-28: 3 mL via INTRAVENOUS

## 2019-08-28 SURGICAL SUPPLY — 10 items
CATH INFINITI 5FR JK (CATHETERS) ×2 IMPLANT
CATH SWANZ 7F THERMO (CATHETERS) ×2 IMPLANT
DEVICE RAD TR BAND REGULAR (VASCULAR PRODUCTS) ×2 IMPLANT
GLIDESHEATH SLEND SS 6F .021 (SHEATH) ×2 IMPLANT
GLIDESHEATH SLENDER 7FR .021G (SHEATH) ×2 IMPLANT
GUIDEWIRE INQWIRE 1.5J.035X260 (WIRE) IMPLANT
INQWIRE 1.5J .035X260CM (WIRE) ×6
KIT MANI 3VAL PERCEP (MISCELLANEOUS) ×3 IMPLANT
PACK CARDIAC CATH (CUSTOM PROCEDURE TRAY) ×3 IMPLANT
SHEATH GLIDE SLENDER 4/5FR (SHEATH) IMPLANT

## 2019-08-28 NOTE — Progress Notes (Signed)
Progress Note  Patient Name: Curtis Walters Date of Encounter: 08/28/2019  [facility] Cardiologist: new to [facility]  Subjective   He feels significantly better with less shortness of breath.  He underwent a right and left cardiac catheterization this morning which showed heavily calcified proximal LAD but no evidence of obstructive disease.  Right heart catheterization showed mildly elevated filling pressures, mild pulmonary hypertension and severely reduced cardiac output.  Inpatient Medications    Scheduled Meds: . aspirin      . [MAR Hold] aspirin EC  81 mg Oral Daily  . [MAR Hold] enoxaparin (LOVENOX) injection  40 mg Subcutaneous Q24H  . [MAR Hold] folic acid  1 mg Oral Daily  . [MAR Hold] furosemide  20 mg Intravenous BID  . [MAR Hold] LORazepam  0-4 mg Intravenous Q12H  . [MAR Hold] metoprolol succinate  25 mg Oral Daily  . [MAR Hold] multivitamin with minerals  1 tablet Oral Daily  . [MAR Hold] nicotine  21 mg Transdermal Daily  . [MAR Hold] sacubitril-valsartan  1 tablet Oral BID  . [MAR Hold] sodium chloride flush  3 mL Intravenous Q12H  . [MAR Hold] thiamine  100 mg Oral Daily   Or  . [MAR Hold] thiamine  100 mg Intravenous Daily   Continuous Infusions: . [MAR Hold] sodium chloride    . sodium chloride     PRN Meds: [MAR Hold] sodium chloride, [MAR Hold] acetaminophen, [MAR Hold] albuterol, [MAR Hold] dextromethorphan-guaiFENesin, [MAR Hold] hydrALAZINE, [MAR Hold] ibuprofen, [MAR Hold] LORazepam **OR** [MAR Hold] LORazepam, [MAR Hold] sodium chloride flush, [MAR Hold] traMADol   Vital Signs    Vitals:   08/28/19 0855 08/28/19 0900 08/28/19 0915 08/28/19 0930  BP: 113/87 (!) 115/93 105/82 105/82  Pulse:    (!) 107  Resp: (!) 31 16 (!) 0 16  Temp:      TempSrc:      SpO2: 98% 98% 100% 94%  Weight:      Height:        Intake/Output Summary (Last 24 hours) at 08/28/2019 0948 Last data filed at 08/27/2019 2238 Gross per 24 hour  Intake 480 ml  Output  400 ml  Net 80 ml   Last 3 Weights 08/28/2019 08/27/2019 08/26/2019  Weight (lbs) 138 lb 14.4 oz 136 lb 11.2 oz 142 lb 1.6 oz  Weight (kg) 63.005 kg 62.007 kg 64.456 kg      Telemetry    Normal sinus rhythm with sinus tachycardia and frequent PVCs- Personally Reviewed  ECG     - Personally Reviewed  Physical Exam   Constitutional:  oriented to person, place, and time. No distress.  HENT:  Head: Normocephalic and atraumatic.  Eyes:  no discharge. No scleral icterus.  Neck: Normal range of motion. Neck supple. No JVD present.  Cardiovascular: Normal rate, regular rhythm, normal heart sounds and intact distal pulses. Exam reveals no gallop and no friction rub. No edema No murmur heard. Pulmonary/Chest: Effort normal and breath sounds normal. No stridor. No respiratory distress.  no wheezes.  no rales.  no tenderness.  Abdominal: Soft.  no distension.  no tenderness.  Musculoskeletal: Normal range of motion.  no  tenderness or deformity.  Neurological:  normal muscle tone. Coordination normal. No atrophy Skin: Skin is warm and dry. No rash noted. not diaphoretic.  Psychiatric:  normal mood and affect. behavior is normal. Thought content normal.    Labs    High Sensitivity Troponin:   Recent Labs  Lab 08/25/19 1256 08/25/19  1549 08/25/19 1720 08/25/19 2157  TROPONINIHS 92* 72* 71* 90*      Chemistry Recent Labs  Lab 08/25/19 0957 08/26/19 0607 08/27/19 0532  NA 137 138 141  K 3.9 3.6 3.0*  CL 100 95* 97*  CO2 22 27 27   GLUCOSE 97 85 96  BUN 17 21 28*  CREATININE 1.00 1.22 1.49*  CALCIUM 9.1 9.3 8.9  PROT 6.9  --   --   ALBUMIN 3.8  --   --   AST 120*  --   --   ALT 61*  --   --   ALKPHOS 45  --   --   BILITOT 2.0*  --   --   GFRNONAA >60 60* 47*  GFRAA >60 >60 54*  ANIONGAP 15 16* 17*     Hematology Recent Labs  Lab 08/25/19 0957 08/26/19 0607 08/27/19 0532  WBC 5.5 4.8 4.7  RBC 4.38 4.70 4.79  HGB 14.0 15.3 15.4  HCT 40.0 44.3 44.1  MCV 91.3  94.3 92.1  MCH 32.0 32.6 32.2  MCHC 35.0 34.5 34.9  RDW 21.7* 21.6* 21.0*  PLT 116* 132* 122*    BNP Recent Labs  Lab 08/25/19 0957  BNP 1,505.4*     DDimer No results for input(s): DDIMER in the last 168 hours.   Radiology    CARDIAC CATHETERIZATION  Result Date: 08/28/2019  Prox LAD to Mid LAD lesion is 30% stenosed.  1.  Mild heavily calcified proximal LAD disease which does not seem to be obstructive.  No evidence of obstructive coronary artery disease overall. 2.  Severely reduced LV systolic function by echo.  Left ventricular angiography was not performed. 3.  Right heart catheterization showed mildly elevated filling pressures, mild pulmonary hypertension and severely reduced cardiac output. RA: 6 mmHg RV: 39 over 2 mmHg Pulmonary capillary wedge pressure: 19 mmHg with normal waveforms. PA: 39/21 with a mean of 29 mmHg LVEDP: 17 mmHg. Cardiac output: 3.43 with a cardiac index of 1.86. Recommendations: The patient has nonischemic cardiomyopathy. I switched furosemide to oral 40 mg once daily. Continue Toprol and Entresto.  Possible discharge home if he remains stable this afternoon.    Cardiac Studies   Echocardiogram performed yesterday 1. Left ventricular ejection fraction, by estimation, is 20 to 25%. The  left ventricle has severely decreased function. The left ventricle has no  regional wall motion abnormalities. The left ventricular internal cavity  size was moderately dilated.  Indeterminate diastolic filling due to E-A fusion.  2. Right ventricular systolic function is mildly reduced. The right  ventricular size is mildly enlarged. There is severely elevated pulmonary  artery systolic pressure. The estimated right ventricular systolic  pressure is 72.1 mmHg.  3. Left atrial size was moderately dilated.  4. Right atrial size was moderately dilated.  5. Moderate mitral valve regurgitation.  6. Tricuspid valve regurgitation is moderate.  7. Aortic valve  regurgitation is mild to moderate. stenosis.  8. The inferior vena cava is dilated in size with <50% respiratory  variability, suggesting right atrial pressure of 15 mmHg.     Patient Profile     Mr. Evelyn Moch is a 69 year old gentleman with a long history of smoking, chronic back pain, walks with a cane, presenting with worsening shortness of breath, leg swelling Echo with severely depressed LV function 20%, abnormal EKG, CT scan with heavy coronary calcification LAD territory, acute systolic CHF  Assessment & Plan   1.  Acute systolic heart failure: EF was 20%  by echo with global hypokinesis.  Cardiac catheterization this morning showed no evidence of obstructive coronary artery disease.  The patient has nonischemic cardiomyopathy. Right heart catheterization showed mildly elevated filling pressures.  I switched furosemide to 40 mg by mouth once daily.  Continue current dose of Toprol and Entresto.  Not able to increase the doses on both due to relatively low blood pressure. If the patient is able to ambulate with reasonable symptoms, he can be discharged home in the afternoon.  We will arrange for follow-up in our office in 1 to 2 weeks.  2.  Coronary artery disease: The patient does have mild coronary artery disease on cardiac catheterization which should be treated medically.  Continue low-dose aspirin.  3.  Tobacco use: Smoking cessation recommended   4. Transaminitis: Acute  likely secondary to hepatic congestion, there is elevated total bilirubin and AST He denies heavy alcohol  5. Aortic atherosclerosis Mildly dilated ascending aorta 3.9 cm  periodic imaging on annual basis On aspirin statin   For questions or updates, please contact [facility] Please consult [email or web address] for contact info under        Signed, Lorine Bears, MD  08/28/2019, 9:48 AM

## 2019-08-28 NOTE — Evaluation (Signed)
Occupational Therapy Evaluation Patient Details Name: Curtis Walters MRN: [id number] DOB: September 02, 1950 Today's Date: 08/28/2019    History of Present Illness Per MD order:Tayjon Dartt is a 69 y.o. male without significant past medical history except for tobacco abuse, alcohol use, who presents with leg edema and shortness of breath   Clinical Impression   Pt overall mod I with use of SPC and increased time for self care tasks and functional mobility/transfers within the room. Pt unable to utilize R UE secondary to cardiac cath procedure this morning and still not needing any additional assistance. Pt does not need skilled OT intervention at this time. OT will SIGN OFF. Thank you for referral.     Follow Up Recommendations  No OT follow up    Equipment Recommendations  None recommended by OT    Recommendations for Other Services Other (comment) (none at this time)     Precautions / Restrictions Precautions Precautions: Other (comment) Precaution Comments: cardiac cath today(8/16) NWB on R UE      Mobility Bed Mobility Overal bed mobility: Independent       Transfers Overall transfer level: Modified independent Equipment used: Straight cane       Balance Overall balance assessment: Modified Independent      ADL either performed or assessed with clinical judgement   ADL Overall ADL's : Modified independent          Vision Baseline Vision/History: Wears glasses Wears Glasses: At all times Patient Visual Report: No change from baseline              Pertinent Vitals/Pain Pain Assessment: No/denies pain     Hand Dominance Right   Extremity/Trunk Assessment Upper Extremity Assessment Upper Extremity Assessment: Overall WFL for tasks assessed (R UE not assessed secondary to cardiac cath)   Lower Extremity Assessment Lower Extremity Assessment: Overall WFL for tasks assessed   Cervical / Trunk Assessment Cervical / Trunk Assessment: Normal   Communication  Communication Communication: No difficulties   Cognition Arousal/Alertness: Awake/alert Behavior During Therapy: WFL for tasks assessed/performed Overall Cognitive Status: Within Functional Limits for tasks assessed        General Comments  use of [facility]            Home Living Family/patient expects to be discharged to:: Private residence Living Arrangements: Spouse/significant other Available Help at Discharge: Family;Available 24 hours/day Type of Home: House Home Access: Level entry     Home Layout: One level     Bathroom Shower/Tub: Producer, television/film/video: Standard     Home Equipment: Cane - single point          Prior Functioning/Environment Level of Independence: Independent with assistive device(s)        Comments: spc                 OT Goals(Current goals can be found in the care plan section) Acute Rehab OT Goals Patient Stated Goal: to go home OT Goal Formulation: With patient Time For Goal Achievement: 09/11/19 Potential to Achieve Goals: Good  OT Frequency:     Barriers to D/C: Other (comment)  none known at this time          AM-PAC OT "6 Clicks" Daily Activity     Outcome Measure Help from another person eating meals?: None Help from another person taking care of personal grooming?: None Help from another person toileting, which includes using toliet, bedpan, or urinal?: None Help from another person bathing (including washing, rinsing,  drying)?: None Help from another person to put on and taking off regular upper body clothing?: None Help from another person to put on and taking off regular lower body clothing?: None 6 Click Score: 24   End of Session Equipment Utilized During Treatment: Other (comment) [facility])  Activity Tolerance: Patient tolerated treatment well Patient left: in bed;with call bell/phone within reach                   Time: 1441-1452 OT Time Calculation (min): 11 min Charges:  OT General  Charges $OT Visit: 1 Visit OT Evaluation $OT Eval Low Complexity: 1 Low Jackquline Denmark, MS, OTR/L , CBIS ascom [phone]  08/28/19, 4:11 PM

## 2019-08-28 NOTE — Interval H&P Note (Signed)
History and Physical Interval Note:  8/16/2021Cath Lab Visit (complete for each Cath Lab visit)  Clinical Evaluation Leading to the Procedure:   ACS: Yes.    CHF: NYHA class 4     8:01 AM  Curtis Walters  has presented today for surgery, with the diagnosis of dilated cardiomyopathy, acute systolic CHF.  The various methods of treatment have been discussed with the patient and family. After consideration of risks, benefits and other options for treatment, the patient has consented to  Procedure(s): RIGHT AND LEFT HEART CATH with possible percutaneous intervention (N/A) as a surgical intervention.  The patient's history has been reviewed, patient examined, no change in status, stable for surgery.  I have reviewed the patient's chart and labs.  Questions were answered to the patient's satisfaction.     Lorine Bears

## 2019-08-28 NOTE — Progress Notes (Signed)
PROGRESS NOTE    Kratos Ruscitti  YKD:[id number] DOB: 22-Nov-1950 DOA: 08/25/2019 PCP: Patient, No Pcp Per    Assessment & Plan:   Principal Problem:   Acute systolic CHF (congestive heart failure) (HCC) Active Problems:   Abnormal LFTs   Thrombocytopenia (HCC)   Tobacco abuse   Elevated troponin   Sinus tachycardia   Alcohol use   Dilated cardiomyopathy (HCC)   Acute exacerbation of CHF (congestive heart failure) (HCC)   Acute systolic CHF exacerbation:  echo shows EF 20-25%, indeterminate function & no wall motion abnormalities. Continue on IV lasix. Monitor I/Os and daily weights. Continue on metoprolol, aspirin, lisinopril. Cardiac cath today   Elevated troponin: likely to due demand ischemia. Cardiac cath today   Hypokalemia: WNL today. Will continue to monitor   Transaminitis: likely due to alcohol use and liver congestion from CHF.  Thrombocytopenia: likely secondary to alcohol use. Trending up. Will continue to monitor  Tobacco abuse: nicotine patch to prevent w/drawal   Alcohol use: CIWA protocol    DVT prophylaxis:lovenox Code Status: full  Family Communication: Disposition Plan: depends on PT/OT recs   Consultants:   cardio   Procedures:    Antimicrobials:   Subjective: Pt c/o being sleepy & tired  Objective: Vitals:   08/28/19 1030 08/28/19 1045 08/28/19 1100 08/28/19 1142  BP: (!) 86/70 92/75 (!) 85/67 (!) 82/60  Pulse: (!) 109 (!) 113 (!) 113 80  Resp: (!) 0 18 18 19   Temp:    97.6 F (36.4 C)  TempSrc:    Oral  SpO2: 99% 94% 99%   Weight:      Height:        Intake/Output Summary (Last 24 hours) at 08/28/2019 1332 Last data filed at 08/28/2019 1142 Gross per 24 hour  Intake --  Output 100 ml  Net -100 ml   Filed Weights   08/26/19 0420 08/27/19 0516 08/28/19 0528  Weight: 64.5 kg 62 kg 63 kg    Examination:  General exam: Appears calm and comfortable  Respiratory system: decreased breath sounds b/l. No  rales, Cardiovascular system: S1 & S2 +. No  rubs, gallops or clicks. B/l  LE 2+ pitting edema  Gastrointestinal system: Abdomen is nondistended, soft and nontender. hypoactive bowel sounds heard. Central nervous system: Lethargic. Moves all 4 extremities  Psychiatry: Judgement and insight appear normal. Mood & affect appropriate.    Data Reviewed: I have personally reviewed following labs and imaging studies  CBC: Recent Labs  Lab 08/25/19 0957 08/26/19 0607 08/27/19 0532 08/28/19 1136  WBC 5.5 4.8 4.7 5.7  NEUTROABS 3.4  --   --   --   HGB 14.0 15.3 15.4 14.4  HCT 40.0 44.3 44.1 41.4  MCV 91.3 94.3 92.1 94.3  PLT 116* 132* 122* 144*   Basic Metabolic Panel: Recent Labs  Lab 08/25/19 0957 08/26/19 0607 08/27/19 0532 08/28/19 1136  NA 137 138 141 140  K 3.9 3.6 3.0* 4.3  CL 100 95* 97* 101  CO2 22 27 27 28   GLUCOSE 97 85 96 128*  BUN 17 21 28* 38*  CREATININE 1.00 1.22 1.49* 1.43*  CALCIUM 9.1 9.3 8.9 9.0  MG  --  1.7  --  1.9   GFR: Estimated Creatinine Clearance: 42.8 mL/min (A) (by C-G formula based on SCr of 1.43 mg/dL (H)). Liver Function Tests: Recent Labs  Lab 08/25/19 0957  AST 120*  ALT 61*  ALKPHOS 45  BILITOT 2.0*  PROT 6.9  ALBUMIN 3.8  No results for input(s): LIPASE, AMYLASE in the last 168 hours. No results for input(s): AMMONIA in the last 168 hours. Coagulation Profile: No results for input(s): INR, PROTIME in the last 168 hours. Cardiac Enzymes: No results for input(s): CKTOTAL, CKMB, CKMBINDEX, TROPONINI in the last 168 hours. BNP (last 3 results) No results for input(s): PROBNP in the last 8760 hours. HbA1C: Recent Labs    08/26/19 0607  HGBA1C 4.9   CBG: No results for input(s): GLUCAP in the last 168 hours. Lipid Profile: Recent Labs    08/26/19 0607  CHOL 192  HDL 72  LDLCALC 104*  TRIG 82  CHOLHDL 2.7   Thyroid Function Tests: Recent Labs    08/26/19 0607  TSH 3.981   Anemia Panel: No results for input(s):  VITAMINB12, FOLATE, FERRITIN, TIBC, IRON, RETICCTPCT in the last 72 hours. Sepsis Labs: No results for input(s): PROCALCITON, LATICACIDVEN in the last 168 hours.  Recent Results (from the past 240 hour(s))  SARS Coronavirus 2 by RT PCR (hospital order, performed in [facility] hospital lab) Nasopharyngeal Nasopharyngeal Swab     Status: None   Collection Time: 08/25/19 12:29 PM   Specimen: Nasopharyngeal Swab  Result Value Ref Range Status   SARS Coronavirus 2 NEGATIVE NEGATIVE Final    Comment: (NOTE) SARS-CoV-2 target nucleic acids are NOT DETECTED.  The SARS-CoV-2 RNA is generally detectable in upper and lower respiratory specimens during the acute phase of infection. The lowest concentration of SARS-CoV-2 viral copies this assay can detect is 250 copies / mL. A negative result does not preclude SARS-CoV-2 infection and should not be used as the sole basis for treatment or other patient management decisions.  A negative result may occur with improper specimen collection / handling, submission of specimen other than nasopharyngeal swab, presence of viral mutation(s) within the areas targeted by this assay, and inadequate number of viral copies (<250 copies / mL). A negative result must be combined with clinical observations, patient history, and epidemiological information.  Fact Sheet for Patients:   BoilerBrush.com.cy  Fact Sheet for Healthcare Providers: https://pope.com/  This test is not yet approved or  cleared by the [place] FDA and has been authorized for detection and/or diagnosis of SARS-CoV-2 by FDA under an Emergency Use Authorization (EUA).  This EUA will remain in effect (meaning this test can be used) for the duration of the COVID-19 declaration under Section 564(b)(1) of the Act, 21 U.S.C. section 360bbb-3(b)(1), unless the authorization is terminated or revoked sooner.  Performed at [facility],  [address] [place], [place] [address]          Radiology Studies: CARDIAC CATHETERIZATION  Result Date: 08/28/2019  Prox LAD to Mid LAD lesion is 30% stenosed.  1.  Mild heavily calcified proximal LAD disease which does not seem to be obstructive.  No evidence of obstructive coronary artery disease overall. 2.  Severely reduced LV systolic function by echo.  Left ventricular angiography was not performed. 3.  Right heart catheterization showed mildly elevated filling pressures, mild pulmonary hypertension and severely reduced cardiac output. RA: 6 mmHg RV: 39 over 2 mmHg Pulmonary capillary wedge pressure: 19 mmHg with normal waveforms. PA: 39/21 with a mean of 29 mmHg LVEDP: 17 mmHg. Cardiac output: 3.43 with a cardiac index of 1.86. Recommendations: The patient has nonischemic cardiomyopathy. I switched furosemide to oral 40 mg once daily. Continue Toprol and Entresto.  Possible discharge home if he remains stable this afternoon.  Scheduled Meds: . aspirin      . aspirin EC  81 mg Oral Daily  . enoxaparin (LOVENOX) injection  40 mg Subcutaneous Q24H  . folic acid  1 mg Oral Daily  . [START ON 08/29/2019] furosemide  40 mg Oral Daily  . LORazepam  0-4 mg Intravenous Q12H  . metoprolol succinate  25 mg Oral Daily  . multivitamin with minerals  1 tablet Oral Daily  . nicotine  21 mg Transdermal Daily  . sacubitril-valsartan  1 tablet Oral BID  . sodium chloride flush  3 mL Intravenous Q12H  . sodium chloride flush  3 mL Intravenous Q12H  . thiamine  100 mg Oral Daily   Or  . thiamine  100 mg Intravenous Daily   Continuous Infusions: . sodium chloride    . sodium chloride       LOS: 2 days    Time spent: 30 mins   Charise Killian, MD [facility] Pager [phone]  If 7PM-7AM, please contact night-coverage [email or web address] 08/28/2019, 1:32 PM

## 2019-08-28 NOTE — Progress Notes (Signed)
Patient's Bp is 82/60 on arrival to unit from Cath lab.   Cath site is clean/dry and intact with no signs of bleeding.  Denies pain/numbness and has a good pulse

## 2019-08-28 NOTE — Progress Notes (Addendum)
   08/28/19 1445  Assess: MEWS Score  BP 91/71  Pulse Rate (!) 124  Assess: MEWS Score  MEWS Temp 0  MEWS Systolic 1  MEWS Pulse 2  MEWS RR 0  MEWS LOC 0  MEWS Score 3  MEWS Score Color Yellow  Assess: if the MEWS score is Yellow or Red  Were vital signs taken at a resting state? Yes  Focused Assessment No change from prior assessment  Early Detection of Sepsis Score *See Row Information* Low  MEWS guidelines implemented *See Row Information* No, previously yellow, continue vital signs every 4 hours  Notify: Provider  Provider Name/Title Dr. Mayford Knife   Date Provider Notified 08/28/19  Time Provider Notified 1430  Notification Type Page (secure chat )  Notification Reason Other (Comment) (to let MD know about BP -  )  Response No new orders  Date of Provider Response 08/28/19  Time of Provider Response 1400   Paged MD-   BP dropped   MD aware- reported if MAP > 65 just continue to monitor

## 2019-08-28 NOTE — Progress Notes (Deleted)
   08/28/19 1510  Assess: MEWS Score  Temp (!) 97.4 F (36.3 C)  BP (!) 88/72  Pulse Rate (!) 118  Resp 18  SpO2 100 %  Assess: MEWS Score  MEWS Temp 0  MEWS Systolic 1  MEWS Pulse 2  MEWS RR 0  MEWS LOC 0  MEWS Score 3  MEWS Score Color Yellow  Assess: if the MEWS score is Yellow or Red  Were vital signs taken at a resting state? Yes  Focused Assessment No change from prior assessment  Early Detection of Sepsis Score *See Row Information* Low  MEWS guidelines implemented *See Row Information* No, previously yellow, continue vital signs every 4 hours  MD aware of BP -  MD- ok as long as MAP is > 65- will continue to monitor at this time

## 2019-08-28 NOTE — Progress Notes (Addendum)
Patient continue to walk with out calling.  BP has improved but heart rate is still elevated.   Explained to patient concern for HR being elevated (120-130s) and at high risk for falling due to dizziness and SOB.   Patient understands risk of walking while HR is elevated, but continue to be insist on walking   MD is aware.

## 2019-08-28 NOTE — Progress Notes (Addendum)
Mobility Specialist - Progress Note   08/28/19 1405  Mobility  Activity Refused mobility  Mobility performed by Mobility specialist    Pt sitting EOB upon arrival. Pt refused mobility d/t feeling tired and wanting to take a nap. Pt states he will be willing to ambulate after his nap. Per discussion w/ nurse about pt's VS, VS taken. VS: 108 HR, 81/63 BP, 96% SpO2. Pt left sitting EOB. Will attempt session again at a later date/time.    Adamarys Shall Mobility Specialist  08/28/19, 2:07 PM

## 2019-08-28 NOTE — Progress Notes (Signed)
Patient BP is on the lower side post procedure.   Explained to patient importance of staying seated/laying and to call if he needs to get up.   Patient reported he was not going to stay seated, he wants to walk.    Alarm was placed and patient is aware of concern for him falling due to low BP.

## 2019-08-29 DIAGNOSIS — I420 Dilated cardiomyopathy: Secondary | ICD-10-CM | POA: Diagnosis not present

## 2019-08-29 DIAGNOSIS — I428 Other cardiomyopathies: Secondary | ICD-10-CM

## 2019-08-29 DIAGNOSIS — I5021 Acute systolic (congestive) heart failure: Secondary | ICD-10-CM | POA: Diagnosis not present

## 2019-08-29 DIAGNOSIS — D696 Thrombocytopenia, unspecified: Secondary | ICD-10-CM | POA: Diagnosis not present

## 2019-08-29 LAB — BASIC METABOLIC PANEL
Anion gap: 12 (ref 5–15)
BUN: 40 mg/dL — ABNORMAL HIGH (ref 8–23)
CO2: 28 mmol/L (ref 22–32)
Calcium: 9.3 mg/dL (ref 8.9–10.3)
Chloride: 99 mmol/L (ref 98–111)
Creatinine, Ser: 1.43 mg/dL — ABNORMAL HIGH (ref 0.61–1.24)
GFR calc Af Amer: 57 mL/min — ABNORMAL LOW (ref >60)
GFR calc non Af Amer: 49 mL/min — ABNORMAL LOW (ref >60)
Glucose, Bld: 97 mg/dL (ref 70–99)
Potassium: 3.8 mmol/L (ref 3.5–5.1)
Sodium: 139 mmol/L (ref 135–145)

## 2019-08-29 LAB — CBC
HCT: 39.7 % (ref 39.0–52.0)
Hemoglobin: 13.5 g/dL (ref 13.0–17.0)
MCH: 32.1 pg (ref 26.0–34.0)
MCHC: 34 g/dL (ref 30.0–36.0)
MCV: 94.3 fL (ref 80.0–100.0)
Platelets: 135 10*3/uL — ABNORMAL LOW (ref 150–400)
RBC: 4.21 MIL/uL — ABNORMAL LOW (ref 4.22–5.81)
RDW: 21.1 % — ABNORMAL HIGH (ref 11.5–15.5)
WBC: 5.3 10*3/uL (ref 4.0–10.5)
nRBC: 0 % (ref 0.0–0.2)

## 2019-08-29 LAB — MAGNESIUM: Magnesium: 2 mg/dL (ref 1.7–2.4)

## 2019-08-29 MED ORDER — SPIRONOLACTONE 25 MG PO TABS: 12.5000 mg | ORAL_TABLET | Freq: Every day | ORAL | 0 refills | Status: DC

## 2019-08-29 MED ORDER — FUROSEMIDE 40 MG PO TABS: 40.0000 mg | ORAL_TABLET | Freq: Every day | ORAL | 0 refills | Status: DC

## 2019-08-29 MED ORDER — SPIRONOLACTONE 25 MG PO TABS
12.5000 mg | ORAL_TABLET | Freq: Every day | ORAL | 0 refills | Status: DC
Start: 2019-08-29 — End: 2019-08-29

## 2019-08-29 MED ORDER — ASPIRIN 81 MG PO TBEC: 81.0000 mg | DELAYED_RELEASE_TABLET | Freq: Every day | ORAL | 0 refills | Status: DC

## 2019-08-29 MED ORDER — SACUBITRIL-VALSARTAN 24-26 MG PO TABS: 1.0000 | ORAL_TABLET | Freq: Two times a day (BID) | ORAL | 0 refills | Status: AC

## 2019-08-29 MED ORDER — SPIRONOLACTONE 25 MG PO TABS
12.5000 mg | ORAL_TABLET | Freq: Every day | ORAL | Status: DC
  Filled 2019-08-29: qty 0.5

## 2019-08-29 MED ORDER — METOPROLOL SUCCINATE ER 25 MG PO TB24: 25.0000 mg | ORAL_TABLET | Freq: Every day | ORAL | 0 refills | Status: DC

## 2019-08-29 NOTE — Discharge Summary (Signed)
Physician Discharge Summary  Curtis Walters JWJ:[id number] DOB: [birth year] DOA: 08/25/2019  PCP: Patient, No Pcp Per  Admit date: 08/25/2019 Discharge date: 08/29/2019  Admitted From: home  Disposition:  home  Recommendations for Outpatient Follow-up:  1. Follow up with PCP in 1-2 weeks 2. F/u cardio w/in 1 week   Home Health: no  Equipment/Devices:  Discharge Condition: stable CODE STATUS: full  Diet recommendation: Heart Healthy  Brief/Interim Summary: HPI was taken from Dr. Clyde Lundborg: Curtis Walters is a 69 y.o. male without significant past medical history except for tobacco abuse, alcohol use, who presents with leg edema and shortness of breath  Patient states that he has been having lower extremity swelling and exertional shortness of breath for several weeks, which has been progressively worsening, particularly in the past several days. He has orthopnea and needs to use 4 pillows at night. Has mild dry cough, no chest pain, fever or chills.  Denies nausea vomiting, diarrhea or abdominal pain.  No symptoms of UTI or unilateral weakness.  Patient states that he drinks 1 large cans of beer every day.  ED Course: pt was found to have BNP 1505, D-dimer 3885, WBC 5.5, thrombocytopenia with platelets of 116, pending COVID-19 PCR, electrolytes renal function okay, abnormal liver function (ALP 45, AST 120, ALT 61, total bilirubin 2.0), temperature normal, blood pressure 141/96, tachycardia, RR 16, oxygen saturation 99% on room air.  Chest x-ray showed cardiomegaly and small amount of left pleural effusion.  Patient is placed on progressive bed for observation.  Dr. Mariah Milling of cardiology is consulted.  Hospital Course from Dr. Wilfred Lacy 8/14-8/17/21: Pt presented w/ shortness of breath and was found to have acute systolic CHF exacerbation & nonobstructive cardiomyopathy. Pt was medically treated w/ lasix, metoprolol, lisinopril & spironolactone. Aspirin was d/c as per cardio. Pt had a cardiac cath which  showed nonobstructive cardiomyopathy involving the LAD w/ right heart cath showing mildly elevated filling pressures. Pt will f/u outpatient w/ cardio w/in 1 week as well as go to heart failure clinic. Pt verbalized his understanding.   Discharge Diagnoses:  Principal Problem:   Acute systolic CHF (congestive heart failure) (HCC) Active Problems:   Abnormal LFTs   Thrombocytopenia (HCC)   Tobacco abuse   Elevated troponin   Sinus tachycardia   Alcohol use   Dilated cardiomyopathy (HCC)   Acute exacerbation of CHF (congestive heart failure) (HCC)  Acute systolic CHF exacerbation:  echo shows EF 20-25%, indeterminate function & no wall motion abnormalities. Continue on IV lasix. Monitor I/Os and daily weights. Continue on metoprolol, spironoloactone, lisinopril. Cardiac cath showed nonobstructive disease involving the LAD w/ right heart cath showing mildly elevated filling pressures  Elevated troponin: likely to due demand ischemia.   Hypokalemia: WNL today. Will continue to monitor   Transaminitis:likely due to alcohol use and liver congestion from CHF.  Thrombocytopenia: likely secondary to alcohol use. Labile. Will continue to monitor  Tobacco abuse: nicotine patch to prevent w/drawal   Alcohol use: CIWA protocol   Discharge Instructions  Discharge Instructions    Diet - low sodium heart healthy   Complete by: As directed    Discharge instructions   Complete by: As directed    F/u PCP in 2 weeks. F/u cardio, Dr. Mariah Milling, in 1-2 weeks. Will need lab work, BMP, in 1 week (cardio will arrange)   Increase activity slowly   Complete by: As directed      Allergies as of 08/29/2019      Reactions   Penicillins  Other (See Comments)   Did it involve swelling of the face/tongue/throat, SOB, or low BP? No Did it involve sudden or severe rash/hives, skin peeling, or any reaction on the inside of your mouth or nose? No Did you need to seek medical attention at a hospital or  doctor's office? No When did it last happen? If all above answers are NO, may proceed with cephalosporin use.      Medication List    TAKE these medications   furosemide 40 MG tablet Commonly known as: LASIX Take 1 tablet (40 mg total) by mouth daily. Start taking on: August 30, 2019   metoprolol succinate 25 MG 24 hr tablet Commonly known as: TOPROL-XL Take 1 tablet (25 mg total) by mouth daily. Start taking on: August 30, 2019   sacubitril-valsartan 24-26 MG Commonly known as: ENTRESTO Take 1 tablet by mouth 2 (two) times daily.   spironolactone 25 MG tablet Commonly known as: Aldactone Take 0.5 tablets (12.5 mg total) by mouth daily.       Follow-up Information    [facility] Follow up on 09/12/2019.   Specialty: Cardiology Why: at 2:30pm. Enter through the Medical Mall entrance Contact information: [address] [place] [address] [phone]       Antonieta Iba, MD On 09/06/2019.   Specialty: Cardiology Why: Appointment at 2:20pm Contact information: [address] [place] [address] [phone]              Allergies  Allergen Reactions   Penicillins Other (See Comments)    Did it involve swelling of the face/tongue/throat, SOB, or low BP? No Did it involve sudden or severe rash/hives, skin peeling, or any reaction on the inside of your mouth or nose? No Did you need to seek medical attention at a hospital or doctor's office? No When did it last happen? If all above answers are NO, may proceed with cephalosporin use.    Consultations:  Cardio, Dr. Mariah Milling    Procedures/Studies: CT ANGIO CHEST PE W OR WO CONTRAST  Result Date: 08/25/2019 CLINICAL DATA:  Shortness of breath, positive D-dimer EXAM: CT ANGIOGRAPHY CHEST WITH CONTRAST TECHNIQUE: Multidetector CT imaging of the chest was performed using the standard protocol during bolus  administration of intravenous contrast. Multiplanar CT image reconstructions and MIPs were obtained to evaluate the vascular anatomy. CONTRAST:  75mL OMNIPAQUE IOHEXOL 350 MG/ML SOLN COMPARISON:  None. FINDINGS: Cardiovascular: Cardiomegaly. Scattered aortic and moderate coronary artery calcifications. No filling defects in the pulmonary arteries to suggest pulmonary emboli. Mediastinum/Nodes: No mediastinal, hilar, or axillary adenopathy. Trachea and esophagus are unremarkable. Thyroid unremarkable. Lungs/Pleura: Emphysema. Moderate right pleural effusion. No confluent opacities. Upper Abdomen: Small amount of perihepatic and perisplenic ascites. Reflux of contrast into the IVC and hepatic veins compatible with right heart dysfunction. Musculoskeletal: Chest wall soft tissues are unremarkable. No acute bony abnormality. Review of the MIP images confirms the above findings. IMPRESSION: No evidence of pulmonary embolus. Cardiomegaly, coronary artery disease. Moderate right pleural effusion. Aortic Atherosclerosis (ICD10-I70.0) and Emphysema (ICD10-J43.9). Electronically Signed   By: Charlett Nose M.D.   On: 08/25/2019 18:02   CARDIAC CATHETERIZATION  Result Date: 08/28/2019  Prox LAD to Mid LAD lesion is 30% stenosed.  1.  Mild heavily calcified proximal LAD disease which does not seem to be obstructive.  No evidence of obstructive coronary artery disease overall. 2.  Severely reduced LV systolic function by echo.  Left ventricular angiography  was not performed. 3.  Right heart catheterization showed mildly elevated filling pressures, mild pulmonary hypertension and severely reduced cardiac output. RA: 6 mmHg RV: 39 over 2 mmHg Pulmonary capillary wedge pressure: 19 mmHg with normal waveforms. PA: 39/21 with a mean of 29 mmHg LVEDP: 17 mmHg. Cardiac output: 3.43 with a cardiac index of 1.86. Recommendations: The patient has nonischemic cardiomyopathy. I switched furosemide to oral 40 mg once daily. Continue  Toprol and Entresto.  Possible discharge home if he remains stable this afternoon.   US Venous Img Lower Bilateral (DVT)  Result Date: 08/26/2019 CLINICAL DATA:  Positive D-dimer and shortness of breath. EXAM: BILATERAL LOWER EXTREMITY VENOUS DOPPLER ULTRASOUND TECHNIQUE: Gray-scale sonography with graded compression, as well as color Doppler and duplex ultrasound were performed to evaluate the lower extremity deep venous systems from the level of the common femoral vein and including the common femoral, femoral, profunda femoral, popliteal and calf veins including the posterior tibial, peroneal and gastrocnemius veins when visible. The superficial great saphenous vein was also interrogated. Spectral Doppler was utilized to evaluate flow at rest and with distal augmentation maneuvers in the common femoral, femoral and popliteal veins. COMPARISON:  None. FINDINGS: RIGHT LOWER EXTREMITY Common Femoral Vein: No evidence of thrombus. Normal compressibility, respiratory phasicity and response to augmentation. Saphenofemoral Junction: No evidence of thrombus. Normal compressibility and flow on color Doppler imaging. Profunda Femoral Vein: No evidence of thrombus. Normal compressibility and flow on color Doppler imaging. Femoral Vein: No evidence of thrombus. Normal compressibility, respiratory phasicity and response to augmentation. Popliteal Vein: No evidence of thrombus. Normal compressibility, respiratory phasicity and response to augmentation. Calf Veins: No evidence of thrombus. Normal compressibility and flow on color Doppler imaging. Superficial Great Saphenous Vein: No evidence of thrombus. Normal compressibility. Venous Reflux:  None. Other Findings: No evidence of superficial thrombophlebitis or abnormal fluid collection. LEFT LOWER EXTREMITY Common Femoral Vein: No evidence of thrombus. Normal compressibility, respiratory phasicity and response to augmentation. Saphenofemoral Junction: No evidence of  thrombus. Normal compressibility and flow on color Doppler imaging. Profunda Femoral Vein: No evidence of thrombus. Normal compressibility and flow on color Doppler imaging. Femoral Vein: No evidence of thrombus. Normal compressibility, respiratory phasicity and response to augmentation. Popliteal Vein: No evidence of thrombus. Normal compressibility, respiratory phasicity and response to augmentation. Calf Veins: No evidence of thrombus. Normal compressibility and flow on color Doppler imaging. Superficial Great Saphenous Vein: No evidence of thrombus. Normal compressibility. Venous Reflux:  None. Other Findings: No evidence of superficial thrombophlebitis or abnormal fluid collection. IMPRESSION: No evidence of deep venous thrombosis in either lower extremity. Electronically Signed   By: Irish Lack M.D.   On: 08/26/2019 08:49   DG Chest Portable 1 View  Result Date: 08/25/2019 CLINICAL DATA:  Lower extremity edema EXAM: PORTABLE CHEST 1 VIEW COMPARISON:  None. FINDINGS: There is a an equivocal left pleural effusion. Lungs elsewhere are clear. There is cardiomegaly with pulmonary vascularity normal. No adenopathy. There is aortic atherosclerosis. No bone lesions. IMPRESSION: Cardiomegaly. Equivocal small left pleural effusion. Lungs otherwise clear. No adenopathy. Aortic Atherosclerosis (ICD10-I70.0). Electronically Signed   By: Bretta Bang III M.D.   On: 08/25/2019 11:47   ECHOCARDIOGRAM COMPLETE  Result Date: 08/25/2019    ECHOCARDIOGRAM REPORT   Patient Name:   CLAYBORN MILNES Date of Exam: 08/25/2019 Medical Rec #:  [id number]   Height:       73.0 in Accession #:    [id number]  Weight:       155.0 lb Date  of Birth:  November 07, 1950    BSA:          1.931 m Patient Age:    69 years    BP:           145/109 mmHg Patient Gender: M           HR:           124 bpm. Exam Location:  [facility] Procedure: 2D Echo, Color Doppler and Cardiac Doppler Indications:     I51.7 Cardiomegaly  History:         Patient has no  prior history of Echocardiogram examinations.                  Risk Factors:Current Smoker.  Sonographer:     Humphrey Rolls RDCS (AE) Referring Phys:  [id number] Lennon Alstrom Diagnosing Phys: Julien Nordmann MD IMPRESSIONS  1. Left ventricular ejection fraction, by estimation, is 20 to 25%. The left ventricle has severely decreased function. The left ventricle has no regional wall motion abnormalities. The left ventricular internal cavity size was moderately dilated. Indeterminate diastolic filling due to E-A fusion.  2. Right ventricular systolic function is mildly reduced. The right ventricular size is mildly enlarged. There is severely elevated pulmonary artery systolic pressure. The estimated right ventricular systolic pressure is 72.1 mmHg.  3. Left atrial size was moderately dilated.  4. Right atrial size was moderately dilated.  5. Moderate mitral valve regurgitation.  6. Tricuspid valve regurgitation is moderate.  7. Aortic valve regurgitation is mild to moderate. stenosis.  8. The inferior vena cava is dilated in size with <50% respiratory variability, suggesting right atrial pressure of 15 mmHg. FINDINGS  Left Ventricle: Left ventricular ejection fraction, by estimation, is 20 to 25%. The left ventricle has severely decreased function. The left ventricle has no regional wall motion abnormalities. The left ventricular internal cavity size was moderately dilated. There is no left ventricular hypertrophy. Indeterminate diastolic filling due to E-A fusion. Right Ventricle: The right ventricular size is mildly enlarged. No increase in right ventricular wall thickness. Right ventricular systolic function is mildly reduced. There is severely elevated pulmonary artery systolic pressure. The tricuspid regurgitant velocity is 3.61 m/s, and with an assumed right atrial pressure of 20 mmHg, the estimated right ventricular systolic pressure is 72.1 mmHg. Left Atrium: Left atrial size was moderately dilated. Right  Atrium: Right atrial size was moderately dilated. Pericardium: There is no evidence of pericardial effusion. Mitral Valve: The mitral valve is normal in structure. Normal mobility of the mitral valve leaflets. Moderate mitral valve regurgitation. No evidence of mitral valve stenosis. MV peak gradient, 5.0 mmHg. The mean mitral valve gradient is 2.0 mmHg. Tricuspid Valve: The tricuspid valve is normal in structure. Tricuspid valve regurgitation is moderate . No evidence of tricuspid stenosis. Aortic Valve: The aortic valve is normal in structure. Aortic valve regurgitation is mild to moderate. Aortic regurgitation PHT measures 220 msec. Mild to moderate aortic valve sclerosis/calcification is present, without any evidence of aortic stenosis. Aortic valve mean gradient measures 3.0 mmHg. Aortic valve peak gradient measures 5.4 mmHg. Aortic valve area, by VTI measures 1.97 cm. Pulmonic Valve: The pulmonic valve was normal in structure. Pulmonic valve regurgitation is not visualized. No evidence of pulmonic stenosis. Aorta: The aortic root is normal in size and structure. Venous: The inferior vena cava is dilated in size with less than 50% respiratory variability, suggesting right atrial pressure of 15 mmHg. IAS/Shunts: No atrial level shunt detected by color flow Doppler.  LEFT VENTRICLE PLAX 2D LVIDd:         5.49 cm      Diastology LVIDs:         5.14 cm      LV e' lateral:   11.70 cm/s LV PW:         1.31 cm      LV E/e' lateral: 8.2 LV IVS:        0.98 cm      LV e' medial:    9.25 cm/s LVOT diam:     2.20 cm      LV E/e' medial:  10.3 LV SV:         26 LV SV Index:   14 LVOT Area:     3.80 cm  LV Volumes (MOD) LV vol d, MOD A2C: 136.0 ml LV vol d, MOD A4C: 150.0 ml LV vol s, MOD A2C: 99.9 ml LV vol s, MOD A4C: 106.0 ml LV SV MOD A2C:     36.1 ml LV SV MOD A4C:     150.0 ml LV SV MOD BP:      41.8 ml RIGHT VENTRICLE RV Basal diam:  3.59 cm LEFT ATRIUM              Index       RIGHT ATRIUM           Index LA diam:         4.00 cm  2.07 cm/m  RA Area:     27.20 cm LA Vol (A2C):   117.0 ml 60.60 ml/m RA Volume:   94.60 ml  49.00 ml/m LA Vol (A4C):   69.9 ml  36.20 ml/m LA Biplane Vol: 96.1 ml  49.77 ml/m  AORTIC VALVE                   PULMONIC VALVE AV Area (Vmax):    2.23 cm    PV Vmax:       0.65 m/s AV Area (Vmean):   1.80 cm    PV Vmean:      37.700 cm/s AV Area (VTI):     1.97 cm    PV VTI:        0.056 m AV Vmax:           116.00 cm/s PV Peak grad:  1.7 mmHg AV Vmean:          86.000 cm/s PV Mean grad:  1.0 mmHg AV VTI:            0.134 m AV Peak Grad:      5.4 mmHg AV Mean Grad:      3.0 mmHg LVOT Vmax:         67.90 cm/s LVOT Vmean:        40.800 cm/s LVOT VTI:          0.069 m LVOT/AV VTI ratio: 0.52 AI PHT:            220 msec  AORTA Ao Root diam: 3.40 cm MITRAL VALVE               TRICUSPID VALVE MV Area (PHT): 6.05 cm    TR Peak grad:   52.1 mmHg MV Peak grad:  5.0 mmHg    TR Vmax:        361.00 cm/s MV Mean grad:  2.0 mmHg MV Vmax:       1.12 m/s    SHUNTS MV Vmean:  61.8 cm/s   Systemic VTI:  0.07 m MV Decel Time: 125 msec    Systemic Diam: 2.20 cm MV E velocity: 95.53 cm/s Julien Nordmann MD Electronically signed by Julien Nordmann MD Signature Date/Time: 08/25/2019/4:09:12 PM    Final       Subjective: Pt c/o malaise    Discharge Exam: Vitals:   08/29/19 0446 08/29/19 0829  BP: (!) 127/95 (!) 119/96  Pulse: (!) 115 (!) 118  Resp:  16  Temp: 97.8 F (36.6 C) 97.9 F (36.6 C)  SpO2: 100% 100%   Vitals:   08/28/19 2103 08/29/19 0446 08/29/19 0548 08/29/19 0829  BP: 96/76 (!) 127/95  (!) 119/96  Pulse: (!) 125 (!) 115  (!) 118  Resp: 18   16  Temp: 97.8 F (36.6 C) 97.8 F (36.6 C)  97.9 F (36.6 C)  TempSrc:  Oral  Oral  SpO2: 92% 100%  100%  Weight:   62 kg   Height:        General: Pt is alert, awake, not in acute distress Cardiovascular:  S1/S2 +, no rubs, no gallops Respiratory: CTA bilaterally, no wheezing, no rhonchi Abdominal: Soft, NT, ND, bowel sounds  + Extremities: b/l LE edmea, no cyanosis    The results of significant diagnostics from this hospitalization (including imaging, microbiology, ancillary and laboratory) are listed below for reference.     Microbiology: Recent Results (from the past 240 hour(s))  SARS Coronavirus 2 by RT PCR (hospital order, performed in [facility] hospital lab) Nasopharyngeal Nasopharyngeal Swab     Status: None   Collection Time: 08/25/19 12:29 PM   Specimen: Nasopharyngeal Swab  Result Value Ref Range Status   SARS Coronavirus 2 NEGATIVE NEGATIVE Final    Comment: (NOTE) SARS-CoV-2 target nucleic acids are NOT DETECTED.  The SARS-CoV-2 RNA is generally detectable in upper and lower respiratory specimens during the acute phase of infection. The lowest concentration of SARS-CoV-2 viral copies this assay can detect is 250 copies / mL. A negative result does not preclude SARS-CoV-2 infection and should not be used as the sole basis for treatment or other patient management decisions.  A negative result may occur with improper specimen collection / handling, submission of specimen other than nasopharyngeal swab, presence of viral mutation(s) within the areas targeted by this assay, and inadequate number of viral copies (<250 copies / mL). A negative result must be combined with clinical observations, patient history, and epidemiological information.  Fact Sheet for Patients:   BoilerBrush.com.cy  Fact Sheet for Healthcare Providers: https://pope.com/  This test is not yet approved or  cleared by the [place] FDA and has been authorized for detection and/or diagnosis of SARS-CoV-2 by FDA under an Emergency Use Authorization (EUA).  This EUA will remain in effect (meaning this test can be used) for the duration of the COVID-19 declaration under Section 564(b)(1) of the Act, 21 U.S.C. section 360bbb-3(b)(1), unless the authorization is terminated  or revoked sooner.  Performed at [facility], [address] [place], [place] [address]      Labs: BNP (last 3 results) Recent Labs    08/25/19 0957  BNP 1,505.4*   Basic Metabolic Panel: Recent Labs  Lab 08/25/19 0957 08/26/19 0607 08/27/19 0532 08/28/19 1136 08/29/19 0647  NA 137 138 141 140 139  K 3.9 3.6 3.0* 4.3 3.8  CL 100 95* 97* 101 99  CO2 22 27 27 28 28   GLUCOSE 97 85 96 128* 97  BUN 17 21 28*  38* 40*  CREATININE 1.00 1.22 1.49* 1.43* 1.43*  CALCIUM 9.1 9.3 8.9 9.0 9.3  MG  --  1.7  --  1.9 2.0   Liver Function Tests: Recent Labs  Lab 08/25/19 0957  AST 120*  ALT 61*  ALKPHOS 45  BILITOT 2.0*  PROT 6.9  ALBUMIN 3.8   No results for input(s): LIPASE, AMYLASE in the last 168 hours. No results for input(s): AMMONIA in the last 168 hours. CBC: Recent Labs  Lab 08/25/19 0957 08/26/19 0607 08/27/19 0532 08/28/19 1136 08/29/19 0647  WBC 5.5 4.8 4.7 5.7 5.3  NEUTROABS 3.4  --   --   --   --   HGB 14.0 15.3 15.4 14.4 13.5  HCT 40.0 44.3 44.1 41.4 39.7  MCV 91.3 94.3 92.1 94.3 94.3  PLT 116* 132* 122* 144* 135*   Cardiac Enzymes: No results for input(s): CKTOTAL, CKMB, CKMBINDEX, TROPONINI in the last 168 hours. BNP: Invalid input(s): POCBNP CBG: No results for input(s): GLUCAP in the last 168 hours. D-Dimer No results for input(s): DDIMER in the last 72 hours. Hgb A1c No results for input(s): HGBA1C in the last 72 hours. Lipid Profile No results for input(s): CHOL, HDL, LDLCALC, TRIG, CHOLHDL, LDLDIRECT in the last 72 hours. Thyroid function studies No results for input(s): TSH, T4TOTAL, T3FREE, THYROIDAB in the last 72 hours.  Invalid input(s): FREET3 Anemia work up No results for input(s): VITAMINB12, FOLATE, FERRITIN, TIBC, IRON, RETICCTPCT in the last 72 hours. Urinalysis No results found for: COLORURINE, APPEARANCEUR, LABSPEC, PHURINE, GLUCOSEU, HGBUR, BILIRUBINUR, KETONESUR, PROTEINUR, UROBILINOGEN, NITRITE,  LEUKOCYTESUR Sepsis Labs Invalid input(s): PROCALCITONIN,  WBC,  LACTICIDVEN Microbiology Recent Results (from the past 240 hour(s))  SARS Coronavirus 2 by RT PCR (hospital order, performed in [facility] hospital lab) Nasopharyngeal Nasopharyngeal Swab     Status: None   Collection Time: 08/25/19 12:29 PM   Specimen: Nasopharyngeal Swab  Result Value Ref Range Status   SARS Coronavirus 2 NEGATIVE NEGATIVE Final    Comment: (NOTE) SARS-CoV-2 target nucleic acids are NOT DETECTED.  The SARS-CoV-2 RNA is generally detectable in upper and lower respiratory specimens during the acute phase of infection. The lowest concentration of SARS-CoV-2 viral copies this assay can detect is 250 copies / mL. A negative result does not preclude SARS-CoV-2 infection and should not be used as the sole basis for treatment or other patient management decisions.  A negative result may occur with improper specimen collection / handling, submission of specimen other than nasopharyngeal swab, presence of viral mutation(s) within the areas targeted by this assay, and inadequate number of viral copies (<250 copies / mL). A negative result must be combined with clinical observations, patient history, and epidemiological information.  Fact Sheet for Patients:   BoilerBrush.com.cy  Fact Sheet for Healthcare Providers: https://pope.com/  This test is not yet approved or  cleared by the [place] FDA and has been authorized for detection and/or diagnosis of SARS-CoV-2 by FDA under an Emergency Use Authorization (EUA).  This EUA will remain in effect (meaning this test can be used) for the duration of the COVID-19 declaration under Section 564(b)(1) of the Act, 21 U.S.C. section 360bbb-3(b)(1), unless the authorization is terminated or revoked sooner.  Performed at [facility], [address], [place], [place] [address]      Time coordinating  discharge: Over 30 minutes  SIGNED:   Charise Killian, MD  [facility] 08/29/2019, 2:33 PM Pager   If 7PM-7AM, please contact night-coverage [email or web address]

## 2019-08-29 NOTE — Progress Notes (Signed)
Noticed patient's right hand and finger tips have discoloration at times, inconsistently. Patient denies pain. Will continue to monitor.

## 2019-08-29 NOTE — Progress Notes (Signed)
Progress Note  Patient Name: Curtis Walters Date of Encounter: 08/29/2019  Primary Cardiologist: New to [facility] - consult by [place]  Subjective   He denies any chest pain, dyspnea, palpitations, dizziness, presyncope, or syncope.  He has ambulated without difficulty.  No issues from the right radial cardiac cath site.  Labs are pending this morning.  Tolerating all medications without issues.  BP soft overnight though improved this morning.  Ready to go home.  Inpatient Medications    Scheduled Meds: . aspirin EC  81 mg Oral Daily  . enoxaparin (LOVENOX) injection  40 mg Subcutaneous Q24H  . folic acid  1 mg Oral Daily  . furosemide  40 mg Oral Daily  . LORazepam  0-4 mg Intravenous Q12H  . metoprolol succinate  25 mg Oral Daily  . multivitamin with minerals  1 tablet Oral Daily  . nicotine  21 mg Transdermal Daily  . sacubitril-valsartan  1 tablet Oral BID  . sodium chloride flush  3 mL Intravenous Q12H  . sodium chloride flush  3 mL Intravenous Q12H  . thiamine  100 mg Oral Daily   Or  . thiamine  100 mg Intravenous Daily   Continuous Infusions: . sodium chloride    . sodium chloride     PRN Meds: sodium chloride, sodium chloride, acetaminophen, albuterol, dextromethorphan-guaiFENesin, hydrALAZINE, ibuprofen, sodium chloride flush, sodium chloride flush, traMADol   Vital Signs    Vitals:   08/28/19 1657 08/28/19 2103 08/29/19 0446 08/29/19 0548  BP: (!) 115/92 96/76 (!) 127/95   Pulse:  (!) 125 (!) 115   Resp:  18    Temp:  97.8 F (36.6 C) 97.8 F (36.6 C)   TempSrc:   Oral   SpO2:  92% 100%   Weight:    62 kg  Height:        Intake/Output Summary (Last 24 hours) at 08/29/2019 0728 Last data filed at 08/29/2019 0021 Gross per 24 hour  Intake 240 ml  Output 0 ml  Net 240 ml   Filed Weights   08/27/19 0516 08/28/19 0528 08/29/19 0548  Weight: 62 kg 63 kg 62 kg    Telemetry    Sinus tachycardia with rates in the low 100s to one teens bpm with rare  isolated PVCs- Personally Reviewed  ECG    No new tracings- Personally Reviewed  Physical Exam   GEN: No acute distress.   Neck: No JVD. Cardiac:  Mildly tachycardic, no murmurs, rubs, or gallops.  Right radial cardiac cath site is well-healing without active bleeding, bruising, swelling, warmth, erythema, or tenderness to palpation.  Radial pulse 2+. Respiratory: Clear to auscultation bilaterally.  GI: Soft, nontender, non-distended.   MS: No edema; No deformity. Neuro:  Alert and oriented x 3; Nonfocal.  Psych: Normal affect.  Labs    Chemistry Recent Labs  Lab 08/25/19 0957 08/25/19 0957 08/26/19 0607 08/27/19 0532 08/28/19 1136  NA 137   < > 138 141 140  K 3.9   < > 3.6 3.0* 4.3  CL 100   < > 95* 97* 101  CO2 22   < > 27 27 28   GLUCOSE 97   < > 85 96 128*  BUN 17   < > 21 28* 38*  CREATININE 1.00   < > 1.22 1.49* 1.43*  CALCIUM 9.1   < > 9.3 8.9 9.0  PROT 6.9  --   --   --   --   ALBUMIN 3.8  --   --   --   --  AST 120*  --   --   --   --   ALT 61*  --   --   --   --   ALKPHOS 45  --   --   --   --   BILITOT 2.0*  --   --   --   --   GFRNONAA >60   < > 60* 47* 49*  GFRAA >60   < > >60 54* 57*  ANIONGAP 15   < > 16* 17* 11   < > = values in this interval not displayed.     Hematology Recent Labs  Lab 08/26/19 0607 08/27/19 0532 08/28/19 1136  WBC 4.8 4.7 5.7  RBC 4.70 4.79 4.39  HGB 15.3 15.4 14.4  HCT 44.3 44.1 41.4  MCV 94.3 92.1 94.3  MCH 32.6 32.2 32.8  MCHC 34.5 34.9 34.8  RDW 21.6* 21.0* 21.1*  PLT 132* 122* 144*    Cardiac EnzymesNo results for input(s): TROPONINI in the last 168 hours. No results for input(s): TROPIPOC in the last 168 hours.   BNP Recent Labs  Lab 08/25/19 0957  BNP 1,505.4*     DDimer No results for input(s): DDIMER in the last 168 hours.   Radiology    No new studies.  Cardiac Studies   2D echo 08/25/2019: 1. Left ventricular ejection fraction, by estimation, is 20 to 25%. The  left ventricle has severely  decreased function. The left ventricle has no  regional wall motion abnormalities. The left ventricular internal cavity  size was moderately dilated.  Indeterminate diastolic filling due to E-A fusion.  2. Right ventricular systolic function is mildly reduced. The right  ventricular size is mildly enlarged. There is severely elevated pulmonary  artery systolic pressure. The estimated right ventricular systolic  pressure is 72.1 mmHg.  3. Left atrial size was moderately dilated.  4. Right atrial size was moderately dilated.  5. Moderate mitral valve regurgitation.  6. Tricuspid valve regurgitation is moderate.  7. Aortic valve regurgitation is mild to moderate. stenosis.  8. The inferior vena cava is dilated in size with <50% respiratory  variability, suggesting right atrial pressure of 15 mmHg. __________  [facility] 08/28/2019:  Prox LAD to Mid LAD lesion is 30% stenosed.   1.  Mild heavily calcified proximal LAD disease which does not seem to be obstructive.  No evidence of obstructive coronary artery disease overall. 2.  Severely reduced LV systolic function by echo.  Left ventricular angiography was not performed. 3.  Right heart catheterization showed mildly elevated filling pressures, mild pulmonary hypertension and severely reduced cardiac output.  RA: 6 mmHg RV: 39 over 2 mmHg Pulmonary capillary wedge pressure: 19 mmHg with normal waveforms. PA: 39/21 with a mean of 29 mmHg LVEDP: 17 mmHg. Cardiac output: 3.43 with a cardiac index of 1.86.  Recommendations: The patient has nonischemic cardiomyopathy. I switched furosemide to oral 40 mg once daily. Continue Toprol and Entresto.  Possible discharge home if he remains stable this afternoon.   Patient Profile     69 y.o. male with history of longstanding tobacco use, chronic back pain admitted with acute HFrEF.  Assessment & Plan    1.  Acute HFrEF: -He appears euvolemic -Diagnostic cath this admission showed  nonobstructive disease involving the LAD with right heart catheterization showing mildly elevated filling pressure -If serum creatinine is improving this morning recommend addition of spironolactone 12.5 mg daily with a follow-up BMP as an outpatient at the end of this week -  Otherwise, he should continue current medical therapy including Toprol-XL, Entresto, and furosemide -Unable to escalate beta-blocker or Entresto at this time secondary to acute systolic CHF and relative hypotension -CHF education  2.  Nonobstructive CAD: -No symptoms concerning for angina -Diagnostic LHC this admission showed mild CAD involving the LAD which has been medically managed as outlined above -Continue current medical therapy including aspirin and metoprolol -Not currently on statin secondary to transaminitis, as liver function improves look to add statin as an outpatient -Post-cath instructions -Aggressive resector modification including complete smoking cessation is recommended  3.  Transaminitis: -Felt to be secondary to congestive hepatopathy -He denies heavy alcohol use indicate he drinks an occasional beer  -Follow-up as outpatient  4.  Ascending aortic dilatation: -Optimal BP and heart rate control recommended -Periodic outpatient imaging  5.  Tobacco use: -Complete cessation is recommended  Dispo: -From a cardiac perspective, patient is stable for discharge today.  If renal function is improving recommend addition of spironolactone 12.5 mg daily with follow-up BMP in our office at the end of this week.  We will arrange this follow-up lab.  Otherwise, he should continue current cardiac medications and follow-up in our office in 1 to 2 weeks, which will be arranged by [place].   For questions or updates, please contact [facility] Please consult [email or web address] for contact info under Cardiology/STEMI.    Signed, Eula Listen, PA-C [facility] Pager: [phone] 08/29/2019, 7:28 AM

## 2019-08-30 ENCOUNTER — Telehealth: Payer: Self-pay | Admitting: Cardiovascular Disease

## 2019-08-30 NOTE — Telephone Encounter (Signed)
TCM....  Patient is being discharged   They saw Leafy Kindle  They are scheduled to see Flavia Shipper on 08/25  They were seen for new acute onset heart failure  They need to be seen within 1 to 2 weeks  Pt not added to wait list   Please call

## 2019-08-30 NOTE — Telephone Encounter (Signed)
TCM- 1st attempt  Attempted to call the patient. No answer- no voice mail box has been set up. Will call back at a later time.

## 2019-08-31 ENCOUNTER — Telehealth: Payer: Self-pay | Admitting: Family

## 2019-08-31 NOTE — Telephone Encounter (Signed)
Unable to reach patient regarding his new patient CHF Clinic appointment that was made while he was inpatient. We also wanted to follow up to see how he is doing since back home.   Amma Crear, NT

## 2019-09-04 NOTE — Telephone Encounter (Signed)
TCM- 2nd attempt  Attempted to call the patient. No answer- no voice mail box has been set up. Will call back at a later time.

## 2019-09-05 NOTE — Telephone Encounter (Signed)
The patient called and cancelled his post hospital follow up on 8/25 with Ward Givens, NP. Per cancellation notes[facility]

## 2019-09-06 ENCOUNTER — Ambulatory Visit: Payer: Medicare Other | Admitting: Nurse Practitioner

## 2019-09-12 ENCOUNTER — Ambulatory Visit: Payer: Medicare Other | Admitting: Family

## 2019-09-14 ENCOUNTER — Other Ambulatory Visit: Payer: Self-pay

## 2019-09-14 ENCOUNTER — Ambulatory Visit: Payer: Medicare Other | Attending: Family | Admitting: Family

## 2019-09-14 ENCOUNTER — Encounter: Payer: Self-pay | Admitting: Family

## 2019-09-14 VITALS — BP 145/99 | HR 126 | Resp 20 | Ht 74.0 in | Wt 140.1 lb

## 2019-09-14 DIAGNOSIS — I272 Pulmonary hypertension, unspecified: Secondary | ICD-10-CM | POA: Insufficient documentation

## 2019-09-14 DIAGNOSIS — I5022 Chronic systolic (congestive) heart failure: Secondary | ICD-10-CM

## 2019-09-14 DIAGNOSIS — I428 Other cardiomyopathies: Secondary | ICD-10-CM | POA: Diagnosis not present

## 2019-09-14 DIAGNOSIS — Z79899 Other long term (current) drug therapy: Secondary | ICD-10-CM | POA: Diagnosis not present

## 2019-09-14 DIAGNOSIS — Z789 Other specified health status: Secondary | ICD-10-CM

## 2019-09-14 DIAGNOSIS — I2510 Atherosclerotic heart disease of native coronary artery without angina pectoris: Secondary | ICD-10-CM | POA: Insufficient documentation

## 2019-09-14 DIAGNOSIS — Z720 Tobacco use: Secondary | ICD-10-CM

## 2019-09-14 DIAGNOSIS — R7989 Other specified abnormal findings of blood chemistry: Secondary | ICD-10-CM | POA: Diagnosis not present

## 2019-09-14 DIAGNOSIS — F17210 Nicotine dependence, cigarettes, uncomplicated: Secondary | ICD-10-CM | POA: Insufficient documentation

## 2019-09-14 DIAGNOSIS — Z880 Allergy status to penicillin: Secondary | ICD-10-CM | POA: Diagnosis not present

## 2019-09-14 DIAGNOSIS — I2584 Coronary atherosclerosis due to calcified coronary lesion: Secondary | ICD-10-CM | POA: Insufficient documentation

## 2019-09-14 NOTE — Patient Instructions (Addendum)
Begin weighing daily and call for an overnight weight gain of > 2 pounds or a weekly weight gain of >5 pounds.   Resume entresto 24/26mg  as 1 tablet twice daily

## 2019-09-14 NOTE — Progress Notes (Signed)
Patient ID: Curtis Walters, male    DOB: 1950-01-19, 68 y.o.   MRN: [id number]  HPI  Curtis Walters is a 69 y/o male with a history of current tobacco use and chronic heart failure.   Echo report from 08/25/19 reviewed and showed an EF of 20-25% along with severely elevated PA pressure, mild/ moderate Curtis/TR and mild/ moderate AR.   Catheterization done 08/28/19 and showed:  Prox LAD to Mid LAD lesion is 30% stenosed.   1.  Mild heavily calcified proximal LAD disease which does not seem to be obstructive.  No evidence of obstructive coronary artery disease overall. 2.  Severely reduced LV systolic function by echo.  Left ventricular angiography was not performed. 3.  Right heart catheterization showed mildly elevated filling pressures, mild pulmonary hypertension and severely reduced cardiac output. RA: 6 mmHg RV: 39 over 2 mmHg Pulmonary capillary wedge pressure: 19 mmHg with normal waveforms. PA: 39/21 with a mean of 29 mmHg LVEDP: 17 mmHg. Cardiac output: 3.43 with a cardiac index of 1.86.  Admitted 08/25/19 due to new onset HF. Cardiology consult obtained. Initially given IV lasix with transition to oral diuretics. Catheterization done showed nonischemic cardiomyopathy. Elevated troponin thought to be due to demand ischemia. Discharged after 4 days.   He presents today for his initial visit with a chief complaint of moderate fatigue upon minimal exertion. He describes this as having been present for several months. He has associated shortness of breath, pedal edema, palpitations, difficulty sleeping and light-headedness along with this. He denies any abdominal distention, chest pain or cough.   He says that he's tired "all the time" and just feels bad ever since he started all these medications. Says that he takes them in the morning and then feels bad for hours later until they start to wear off and then it repeats when he takes them again. He says that he's so tired of feeling so bad that he's  not planning on taking anymore of these medications again and he wants to just throw them out.   Past Medical History:  Diagnosis Date  . CHF (congestive heart failure) (HCC)   . Tobacco abuse    Past Surgical History:  Procedure Laterality Date  . RIGHT AND LEFT HEART CATH N/A 08/28/2019   Procedure: RIGHT AND LEFT HEART CATH with possible percutaneous intervention;  Surgeon: Iran Ouch, MD;  Location: [facility] INVASIVE CV LAB;  Service: Cardiovascular;  Laterality: N/A;   Family History  Problem Relation Age of Onset  . Sickle cell anemia Sister    Social History   Tobacco Use  . Smoking status: Current Every Day Smoker  . Smokeless tobacco: Never Used  Substance Use Topics  . Alcohol use: Yes   Allergies  Allergen Reactions  . Penicillins Other (See Comments)    Did it involve swelling of the face/tongue/throat, SOB, or low BP? No Did it involve sudden or severe rash/hives, skin peeling, or any reaction on the inside of your mouth or nose? No Did you need to seek medical attention at a hospital or doctor's office? No When did it last happen? If all above answers are "NO", may proceed with cephalosporin use.   Prior to Admission medications   Medication Sig Start Date End Date Taking? Authorizing Provider  furosemide (LASIX) 40 MG tablet Take 1 tablet (40 mg total) by mouth daily. 08/30/19 09/29/19 Yes Charise Killian, MD  metoprolol succinate (TOPROL-XL) 25 MG 24 hr tablet Take 1 tablet (25 mg total)  by mouth daily. 08/30/19 09/29/19 Yes Charise Killian, MD  sacubitril-valsartan (ENTRESTO) 24-26 MG Take 1 tablet by mouth 2 (two) times daily. 08/29/19 09/28/19 Yes Charise Killian, MD  spironolactone (ALDACTONE) 25 MG tablet Take 0.5 tablets (12.5 mg total) by mouth daily. 08/29/19 09/28/19 Yes Charise Killian, MD    Review of Systems  Constitutional: Positive for fatigue (easily). Negative for appetite change.  HENT: Positive for rhinorrhea. Negative for  congestion and sore throat.   Eyes: Negative.   Respiratory: Positive for shortness of breath. Negative for cough (dry).   Cardiovascular: Positive for palpitations and leg swelling. Negative for chest pain.  Gastrointestinal: Negative for abdominal distention and abdominal pain.  Endocrine: Negative.   Genitourinary: Negative.   Musculoskeletal: Positive for back pain. Negative for neck pain.  Skin: Negative.   Allergic/Immunologic: Negative.   Neurological: Positive for light-headedness (when short of breath). Negative for dizziness.  Hematological: Negative for adenopathy. Does not bruise/bleed easily.  Psychiatric/Behavioral: Positive for sleep disturbance. Negative for dysphoric mood. The patient is not nervous/anxious.     Vitals:   09/14/19 1557  BP: (!) 145/99  Pulse: (!) 126  Resp: 20  SpO2: 95%  Weight: 140 lb 2 oz (63.6 kg)  Height: 6\' 2"  (1.88 m)   Wt Readings from Last 3 Encounters:  09/14/19 140 lb 2 oz (63.6 kg)  08/29/19 136 lb 11 oz (62 kg)   Lab Results  Component Value Date   CREATININE 1.43 (H) 08/29/2019   CREATININE 1.43 (H) 08/28/2019   CREATININE 1.49 (H) 08/27/2019    Physical Exam Vitals and nursing note reviewed.  Constitutional:      Appearance: Normal appearance.  HENT:     Head: Normocephalic and atraumatic.  Cardiovascular:     Rate and Rhythm: Regular rhythm. Tachycardia present.  Pulmonary:     Effort: Pulmonary effort is normal. No respiratory distress.     Breath sounds: No wheezing or rales.  Abdominal:     General: There is no distension.     Palpations: Abdomen is soft.     Tenderness: There is no abdominal tenderness.  Musculoskeletal:        General: No tenderness.     Cervical back: Normal range of motion and neck supple.     Right lower leg: Edema present.     Left lower leg: Edema (1+ pitting) present.  Skin:    General: Skin is warm and dry.  Neurological:     General: No focal deficit present.     Mental Status:  He is alert and oriented to person, place, and time.  Psychiatric:        Mood and Affect: Mood is anxious.        Behavior: Behavior normal.        Thought Content: Thought content normal.    Assessment & Plan:  1: Chronic heart failure with reduced ejection fraction- - NYHA class III - euvolemic today - scales given to him and he was instructed to weigh every morning after using the bathroom, write the weight down and call for an overnight weight gain of >2 pounds or a weekly weight gain of >5 pounds - not adding salt to his food; reviewed the importance of reading food labels and written dietary information and low sodium cookbook were given to him - saw cardiology during recent admission but patient cancelled post hospital appt; willing to have it R/S so it was scheduled for 09/28/19 - after explaining HF and  what the medications are for and that he may need numerous HF medications, he is willing to resume entresto but that was all for now; hope to add beta blocker back at cardiology appointment and then continue to add/ titrate up slowly - check BMP next visit - BNP 08/25/19 was 1505.4 - reports receiving both covid vaccines - encouraged to elevate his legs when sitting for long periods of time - consider pulmonology referral due to severely elevated PA pressure on echo  2: Tobacco use- - smoking 1/4 ppd of cigarettes; says that he quit once for 12 years but then went to [place] for his [employer] service - complete cessation discussed for 3 minutes with him  3: Alcohol use- - drank 2 beers this week - BMP 08/29/19 reviewed and showed sodium 138, potassium 3.8, creatinine 1.43 and GFR 57   Medication bottles were reviewed.   Return in 1 month or sooner for any questions/problems before then.

## 2019-09-19 ENCOUNTER — Telehealth: Payer: Self-pay | Admitting: Family

## 2019-09-19 NOTE — Telephone Encounter (Signed)
Spoke to patient to see how he was doing since we saw him last week. He has been taking entresto 24/26mg  BID consistently since last week and says that the "funny feelings" he had been having are much improved. He says that he doesn't have any swelling in his right leg and the swelling in his left leg has improved.   Explained that we will need to reintroduce the other medications but can hopefully do so one at a time.  Says that he has to get new batteries for the scales so hasn't been weighing himself. Told him that if the new batteries don't work, to bring the scales to his cardiology appointment next week and we can give him a new set.   Does not have a BP cuff at home.   Advised to call us if he needs [place] before his next appointment with [place].

## 2019-09-26 ENCOUNTER — Encounter: Payer: Self-pay | Admitting: Physician Assistant

## 2019-09-26 NOTE — Progress Notes (Signed)
Cardiology Office Note    Date:  09/28/2019   ID:  Jerene Canny, DOB [birth year], MRN [id number]  PCP:  Patient, No Pcp Per  Cardiologist:  Julien Nordmann, MD  Electrophysiologist:  None   Chief Complaint: Hospital follow up  History of Present Illness:   Curtis Walters is a 69 y.o. male with history of nonobstructive CAD, HFrEF secondary to NICM, pulmonary hypertension, dilated aortic root, alcohol and tobacco use, and chronic back pain who presents for hospital follow up after recent admission to [facility] from 8/13 through 08/29/2019 for acute HFrEF.   Prior to the above admission, he did not have any previously known cardiac history.  He has been without a PCP for quite some time.  He was admitted to [facility] on 8/13 with progressive fatigue, shortness of breath, and lower extremity swelling.  Labs were notable for an elevated BNP of 1505 and a high-sensitivity troponin that peaked at 90.  D-dimer 3800.  Chest x-ray showed cardiomegaly with a small left-sided pleural effusion and aortic atherosclerosis.  AST 120, ALT 161.  T bili 2.0.  CTA chest showed no evidence of PE with cardiomegaly, coronary artery calcification, moderate right pleural effusion, and aortic atherosclerosis.  Lower extremity venous duplex was negative for DVT bilaterally.  Echo demonstrated an EF of 20 to 25%, moderately dilated LV internal cavity size, indeterminate diastolic filling, mildly reduced RV systolic function with a mildly enlarged RV cavity size, severely elevated PASP estimated at 72.1 mmHg, moderate biatrial enlargement, moderate mitral regurgitation, moderate tricuspid regurgitation, mild to moderate aortic stenosis, and an estimated right atrial pressure of 15 mmHg.  He was diuresed with symptom improvement and underwent diagnostic R/LHC on 08/28/2019 which demonstrated nonobstructive disease with mild heavily calcified proximal LAD stenosis estimated at 30%.  RHC showed mildly elevated filling pressures, mild pulmonary  hypertension, and severely reduced cardiac output.  Optimization of medical therapy was recommended.  Documented discharge weight of 62 kg.  He was seen by the [facility] CHF clinic on 9/2 and had self discontinued all of his medications.  He indicated he was fatigued "all of the time" and had hallucinations with these medicines.  His weight had trended up to 63.6 kg at that time.  He was restarted on Entresto.  He comes in today accompanied by his fiance.  He continues to note exertional dyspnea, orthopnea, PND, lower extremity swelling, abdominal distention, and early satiety.  He does report compliance with Entresto 24/26 mg twice daily though is not taking any other medications including furosemide, metoprolol, and spironolactone.  He denies eating foods high in salt or adding salt to his foods.  His predominant fluid intake consists of Pepsi.  He is not weighing himself at home.  He denies any chest pain, dizziness, presyncope, syncope, or palpitations.  He had a discharge weight of 136 pounds with a current clinic weight of 153 pounds.   Labs independently reviewed: 08/2019 - magnesium 2.0, potassium 3.8, BUN 40, SCr 1.43, HGB 13.5, PLT 135, TSH normal, TC 192, TG 82, HDL 72, LDL 104, A1c 4.9, albumin 3.8, AST 120, ALT 61  Past Medical History:  Diagnosis Date  . Alcohol use   . Chronic back pain   . Coronary artery disease, non-occlusive   . Dilated aortic root (HCC)   . HFrEF (heart failure with reduced ejection fraction) (HCC)   . NICM (nonischemic cardiomyopathy) (HCC)   . Tobacco abuse   . Transaminitis     Past Surgical History:  Procedure Laterality Date  .  RIGHT AND LEFT HEART CATH N/A 08/28/2019   Procedure: RIGHT AND LEFT HEART CATH with possible percutaneous intervention;  Surgeon: Iran Ouch, MD;  Location: [facility] INVASIVE CV LAB;  Service: Cardiovascular;  Laterality: N/A;    Current Medications: Current Meds  Medication Sig  . furosemide (LASIX) 40 MG tablet  Take 1 tablet (40 mg total) by mouth daily.  . metoprolol succinate (TOPROL-XL) 25 MG 24 hr tablet Take 1 tablet (25 mg total) by mouth daily.  . sacubitril-valsartan (ENTRESTO) 24-26 MG Take 1 tablet by mouth 2 (two) times daily.    Allergies:   Penicillins   Social History   Socioeconomic History  . Marital status: Single    Spouse name: Not on file  . Number of children: Not on file  . Years of education: Not on file  . Highest education level: Not on file  Occupational History  . Not on file  Tobacco Use  . Smoking status: Current Every Day Smoker  . Smokeless tobacco: Never Used  Substance and Sexual Activity  . Alcohol use: Yes  . Drug use: Not Currently  . Sexual activity: Not Currently  Other Topics Concern  . Not on file  Social History Narrative  . Not on file   Social Determinants of Health   Financial Resource Strain:   . Difficulty of Paying Living Expenses: Not on file  Food Insecurity:   . Worried About Programme researcher, broadcasting/film/video in the Last Year: Not on file  . Ran Out of Food in the Last Year: Not on file  Transportation Needs:   . Lack of Transportation (Medical): Not on file  . Lack of Transportation (Non-Medical): Not on file  Physical Activity:   . Days of Exercise per Week: Not on file  . Minutes of Exercise per Session: Not on file  Stress:   . Feeling of Stress : Not on file  Social Connections:   . Frequency of Communication with Friends and Family: Not on file  . Frequency of Social Gatherings with Friends and Family: Not on file  . Attends Religious Services: Not on file  . Active Member of Clubs or Organizations: Not on file  . Attends Banker Meetings: Not on file  . Marital Status: Not on file     Family History:  The patient's family history includes Sickle cell anemia in his sister.  ROS:   Review of Systems  Constitutional: Positive for malaise/fatigue. Negative for chills, diaphoresis, fever and weight loss.  HENT:  Negative for congestion.   Eyes: Negative for discharge and redness.  Respiratory: Positive for cough and shortness of breath. Negative for sputum production and wheezing.   Cardiovascular: Positive for orthopnea, leg swelling and PND. Negative for chest pain, palpitations and claudication.  Gastrointestinal: Negative for abdominal pain, heartburn, nausea and vomiting.  Musculoskeletal: Negative for falls and myalgias.  Skin: Negative for rash.  Neurological: Positive for weakness. Negative for dizziness, tingling, tremors, sensory change, speech change, focal weakness and loss of consciousness.  Endo/Heme/Allergies: Does not bruise/bleed easily.  Psychiatric/Behavioral: Negative for substance abuse. The patient is not nervous/anxious.   All other systems reviewed and are negative.    EKGs/Labs/Other Studies Reviewed:    Studies reviewed were summarized above. The additional studies were reviewed today:  2D echo 08/25/2019: 1. Left ventricular ejection fraction, by estimation, is 20 to 25%. The  left ventricle has severely decreased function. The left ventricle has no  regional wall motion abnormalities. The left ventricular  internal cavity  size was moderately dilated.  Indeterminate diastolic filling due to E-A fusion.  2. Right ventricular systolic function is mildly reduced. The right  ventricular size is mildly enlarged. There is severely elevated pulmonary  artery systolic pressure. The estimated right ventricular systolic  pressure is 72.1 mmHg.  3. Left atrial size was moderately dilated.  4. Right atrial size was moderately dilated.  5. Moderate mitral valve regurgitation.  6. Tricuspid valve regurgitation is moderate.  7. Aortic valve regurgitation is mild to moderate. stenosis.  8. The inferior vena cava is dilated in size with <50% respiratory  variability, suggesting right atrial pressure of 15 mmHg. __________  [facility] 08/28/2019: -Prox LAD to Mid LAD lesion is  30% stenosed.   1.  Mild heavily calcified proximal LAD disease which does not seem to be obstructive.  No evidence of obstructive coronary artery disease overall. 2.  Severely reduced LV systolic function by echo.  Left ventricular angiography was not performed. 3.  Right heart catheterization showed mildly elevated filling pressures, mild pulmonary hypertension and severely reduced cardiac output.  RA: 6 mmHg RV: 39 over 2 mmHg Pulmonary capillary wedge pressure: 19 mmHg with normal waveforms. PA: 39/21 with a mean of 29 mmHg LVEDP: 17 mmHg. Cardiac output: 3.43 with a cardiac index of 1.86.  Recommendations: The patient has nonischemic cardiomyopathy. I switched furosemide to oral 40 mg once daily. Continue Toprol and Entresto.  Possible discharge home if he remains stable this afternoon.   EKG:  EKG is ordered today.  The EKG ordered today demonstrates normal 14 bpm, left anterior fascicular block, left atrial enlargement, occasional PVCs, nonspecific ST-T changes  Recent Labs: 08/25/2019: ALT 61; B Natriuretic Peptide 1,505.4 08/26/2019: TSH 3.981 08/29/2019: BUN 40; Creatinine, Ser 1.43; Hemoglobin 13.5; Magnesium 2.0; Platelets 135; Potassium 3.8; Sodium 139  Recent Lipid Panel    Component Value Date/Time   CHOL 192 08/26/2019 0607   TRIG 82 08/26/2019 0607   HDL 72 08/26/2019 0607   CHOLHDL 2.7 08/26/2019 0607   VLDL 16 08/26/2019 0607   LDLCALC 104 (H) 08/26/2019 0607    PHYSICAL EXAM:    VS:  BP 118/88 (BP Location: Left Arm, Patient Position: Sitting, Cuff Size: Normal)   Pulse (!) 114   Ht 6\' 2"  (1.88 m)   Wt 153 lb (69.4 kg)   SpO2 95%   BMI 19.64 kg/m   BMI: Body mass index is 19.64 kg/m.  Physical Exam Constitutional:      Appearance: He is well-developed.  HENT:     Head: Normocephalic and atraumatic.  Eyes:     General:        Right eye: No discharge.        Left eye: No discharge.  Neck:     Vascular: No JVD.  Cardiovascular:     Rate and  Rhythm: Normal rate and regular rhythm.     Pulses: No midsystolic click and no opening snap.          Dorsalis pedis pulses are 2+ on the right side and 2+ on the left side.       Posterior tibial pulses are 2+ on the right side and 2+ on the left side.     Heart sounds: Normal heart sounds, S1 normal and S2 normal. Heart sounds not distant. No murmur heard.  No friction rub.     Comments: JVD elevated to the angle of the mandible Pulmonary:     Effort: Pulmonary effort is normal. No  respiratory distress.     Breath sounds: Examination of the right-lower field reveals rales. Examination of the left-lower field reveals rales. Rales present. No decreased breath sounds or wheezing.  Chest:     Chest wall: No tenderness.  Abdominal:     General: There is distension.     Palpations: Abdomen is soft.     Tenderness: There is no abdominal tenderness.  Musculoskeletal:     Cervical back: Normal range of motion.     Right lower leg: Edema present.     Left lower leg: Edema present.     Comments: 2+ bilateral lower extremity pitting edema to mid shins  Skin:    General: Skin is warm and dry.     Nails: There is no clubbing.  Neurological:     Mental Status: He is alert and oriented to person, place, and time.  Psychiatric:        Speech: Speech normal.        Behavior: Behavior normal.        Thought Content: Thought content normal.        Judgment: Judgment normal.     Wt Readings from Last 3 Encounters:  09/28/19 153 lb (69.4 kg)  09/14/19 140 lb 2 oz (63.6 kg)  08/29/19 136 lb 11 oz (62 kg)     ASSESSMENT & PLAN:   1. Acute on chronic HFrEF secondary to NICM: Patient comes in today and is significantly volume overloaded in the setting of medication noncompliance.  His weight trend from hospital discharge has been from 136 pounds to 153 pounds today.  Since his hospital discharge he discontinued all medications.  When he was seen in the [facility] CHF clinic on 9/2 he was restarted  on Entresto monotherapy.  He does report compliance with this.  He has been off loop diuretic, beta-blocker, and MRA for approximately 1 month.  In this setting, his volume status has worsened.  ReDs vest 39%.  Check chest x-ray, CMP, CBC, magnesium, and BNP.  Start torsemide 40 mg daily.  He does associate hallucinations with metoprolol.  Given his decompensated heart failure we will defer patient off beta-blocker at this time.  When he is seen in follow-up consider addition of low-dose carvedilol followed by further escalation of GDMT including spironolactone and Farxiga.  Long and detailed discussion with the patient today regarding the specifics of his heart failure diagnosis up to and including mortality rate.  Recommend medication adherence and optimization to maximally tolerated doses followed by echo in several months time to reevaluate LV systolic function.  If he has remained compliant with medical therapy plan his EF remains less than 35% at that time, he would need referral to EP and advanced heart failure service.  Discussed with patient he is high risk for hospital readmission.  He has very poor insight into his severe illness despite lengthy and detailed conversation today.  Low threshold for ED precautions.  2. Nonobstructive CAD: No symptoms suggestive of angina.  Recent diagnostic cath showed mild CAD involving the LAD as outlined above.  Not currently on statin given transaminitis as outlined below.  Aggressive risk factor modifications including complete smoking cessation is recommended.  3. Dilated aortic root: Optimal blood pressure recommended.  Follow-up echo in 12 months time.  4. 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  Cannot exclude underlying alcohol abuse.  Start torsemide as outlined above.  Check CMP.  As liver function improves look to add statin therapy in  the setting of his CAD.  5. PVCs: Possibly contributing to his underlying cardiomyopathy.   He noted hallucinations with metoprolol.  In follow-up consider addition of low-dose carvedilol.  Check labs as above.  May need outpatient cardiac monitoring to quantify PVC burden.  6. Tobacco use: Complete cessation is recommended.  7. AKI: Check CMP.  Low threshold to send to the ED as outlined above.  Disposition: F/u with Dr. Mariah Milling or an APP in 1 week.   Medication Adjustments/Labs and Tests Ordered: Current medicines are reviewed at length with the patient today.  Concerns regarding medicines are outlined above. Medication changes, Labs and Tests ordered today are summarized above and listed in the Patient Instructions accessible in Encounters.   Signed, Eula Listen, PA-C 09/28/2019 3:01 PM     [facility] [address] [place], [place] [address] [phone]

## 2019-09-28 ENCOUNTER — Other Ambulatory Visit
Admission: RE | Admit: 2019-09-28 | Discharge: 2019-09-28 | Disposition: A | Discharge status: Home / Self Care | Payer: Medicare Other | Attending: Physician Assistant | Admitting: Physician Assistant

## 2019-09-28 ENCOUNTER — Ambulatory Visit (INDEPENDENT_AMBULATORY_CARE_PROVIDER_SITE_OTHER): Payer: Medicare Other | Admitting: Physician Assistant

## 2019-09-28 ENCOUNTER — Encounter: Payer: Self-pay | Admitting: Physician Assistant

## 2019-09-28 ENCOUNTER — Other Ambulatory Visit: Payer: Self-pay

## 2019-09-28 VITALS — BP 118/88 | HR 114 | Ht 74.0 in | Wt 153.0 lb

## 2019-09-28 DIAGNOSIS — I77810 Thoracic aortic ectasia: Secondary | ICD-10-CM | POA: Diagnosis not present

## 2019-09-28 DIAGNOSIS — R0602 Shortness of breath: Secondary | ICD-10-CM | POA: Insufficient documentation

## 2019-09-28 DIAGNOSIS — I2510 Atherosclerotic heart disease of native coronary artery without angina pectoris: Secondary | ICD-10-CM

## 2019-09-28 DIAGNOSIS — I5023 Acute on chronic systolic (congestive) heart failure: Secondary | ICD-10-CM

## 2019-09-28 DIAGNOSIS — I493 Ventricular premature depolarization: Secondary | ICD-10-CM

## 2019-09-28 DIAGNOSIS — I5022 Chronic systolic (congestive) heart failure: Secondary | ICD-10-CM | POA: Insufficient documentation

## 2019-09-28 DIAGNOSIS — R7401 Elevation of levels of liver transaminase levels: Secondary | ICD-10-CM

## 2019-09-28 DIAGNOSIS — N179 Acute kidney failure, unspecified: Secondary | ICD-10-CM

## 2019-09-28 DIAGNOSIS — I428 Other cardiomyopathies: Secondary | ICD-10-CM

## 2019-09-28 DIAGNOSIS — Z720 Tobacco use: Secondary | ICD-10-CM

## 2019-09-28 LAB — COMPREHENSIVE METABOLIC PANEL
ALT: 27 U/L (ref 0–44)
AST: 38 U/L (ref 15–41)
Albumin: 3.6 g/dL (ref 3.5–5.0)
Alkaline Phosphatase: 53 U/L (ref 38–126)
Anion gap: 11 (ref 5–15)
BUN: 26 mg/dL — ABNORMAL HIGH (ref 8–23)
CO2: 20 mmol/L — ABNORMAL LOW (ref 22–32)
Calcium: 9.3 mg/dL (ref 8.9–10.3)
Chloride: 110 mmol/L (ref 98–111)
Creatinine, Ser: 1.31 mg/dL — ABNORMAL HIGH (ref 0.61–1.24)
GFR calc Af Amer: >60 mL/min (ref >60)
GFR calc non Af Amer: 55 mL/min — ABNORMAL LOW (ref >60)
Glucose, Bld: 73 mg/dL (ref 70–99)
Potassium: 4.5 mmol/L (ref 3.5–5.1)
Sodium: 141 mmol/L (ref 135–145)
Total Bilirubin: 1.9 mg/dL — ABNORMAL HIGH (ref 0.3–1.2)
Total Protein: 6.9 g/dL (ref 6.5–8.1)

## 2019-09-28 LAB — CBC WITH DIFFERENTIAL/PLATELET
Abs Immature Granulocytes: 0.02 10*3/uL (ref 0.00–0.07)
Basophils Absolute: 0.1 10*3/uL (ref 0.0–0.1)
Basophils Relative: 1 %
Eosinophils Absolute: 0.1 10*3/uL (ref 0.0–0.5)
Eosinophils Relative: 2 %
HCT: 43 % (ref 39.0–52.0)
Hemoglobin: 14.1 g/dL (ref 13.0–17.0)
Immature Granulocytes: 0 %
Lymphocytes Relative: 24 %
Lymphs Abs: 1.6 10*3/uL (ref 0.7–4.0)
MCH: 33.3 pg (ref 26.0–34.0)
MCHC: 32.8 g/dL (ref 30.0–36.0)
MCV: 101.4 fL — ABNORMAL HIGH (ref 80.0–100.0)
Monocytes Absolute: 0.6 10*3/uL (ref 0.1–1.0)
Monocytes Relative: 9 %
Neutro Abs: 4.1 10*3/uL (ref 1.7–7.7)
Neutrophils Relative %: 64 %
Platelets: 165 10*3/uL (ref 150–400)
RBC: 4.24 MIL/uL (ref 4.22–5.81)
RDW: 16.3 % — ABNORMAL HIGH (ref 11.5–15.5)
WBC: 6.5 10*3/uL (ref 4.0–10.5)
nRBC: 0 % (ref 0.0–0.2)

## 2019-09-28 LAB — BRAIN NATRIURETIC PEPTIDE: B Natriuretic Peptide: >4500 pg/mL — ABNORMAL HIGH (ref 0.0–100.0)

## 2019-09-28 LAB — MAGNESIUM: Magnesium: 1.9 mg/dL (ref 1.7–2.4)

## 2019-09-28 MED ORDER — TORSEMIDE 20 MG PO TABS
40.0000 mg | ORAL_TABLET | Freq: Every day | ORAL | 2 refills | Status: DC
Start: 2019-09-28 — End: 2020-04-20

## 2019-09-28 NOTE — Patient Instructions (Signed)
Medication Instructions:  Your physician has recommended you make the following change in your medication:  1- START Torsemide 40 mg (2 tablets) by mouth once a day. 2- REMAIN off Furosemide and metoprolol.   *If you need a refill on your cardiac medications before your next appointment, please call your pharmacy*  Lab Work: Your physician recommends that you return for lab work in: TODAY - CBC, CMET, BNP, Mg.  - Please go to the [facility]. You will check in at the front desk to the right as you walk into the atrium. Valet Parking is offered if needed. - No appointment needed. You may go any day between 7 am and 6 pm.  If you have labs (blood work) drawn today and your tests are completely normal, you will receive your results only by: Marland Kitchen MyChart Message (if you have MyChart) OR . A paper copy in the mail If you have any lab test that is abnormal or we need to change your treatment, we will call you to review the results.  Testing/Procedures:  A chest x-ray takes a picture of the organs and structures inside the chest, including the heart, lungs, and blood vessels. This test can show several things, including, whether the heart is enlarges; whether fluid is building up in the lungs; and whether pacemaker / defibrillator leads are still in place.  Follow-Up: At [facility], you and your health needs are our priority.  As part of our continuing mission to provide you with exceptional heart care, we have created designated Provider Care Teams.  These Care Teams include your primary Cardiologist (physician) and Advanced Practice Providers (APPs -  Physician Assistants and Nurse Practitioners) who all work together to provide you with the care you need, when you need it.  We recommend signing up for the patient portal called "MyChart".  Sign up information is provided on this After Visit Summary.  MyChart is used to connect with patients for Virtual Visits (Telemedicine).  Patients are  able to view lab/test results, encounter notes, upcoming appointments, etc.  Non-urgent messages can be sent to your provider as well.   To learn more about what you can do with MyChart, go to [email or web address].    Your next appointment:   1 week(s)  The format for your next appointment:   In Person  Provider:    You may see Julien Nordmann, MD or one of the following Advanced Practice Providers on your designated Care Team:    Nicolasa Ducking, NP  Eula Listen, PA-C  Marisue Ivan, PA-C

## 2019-09-29 ENCOUNTER — Telehealth: Payer: Self-pay | Admitting: Cardiovascular Disease

## 2019-09-29 NOTE — Telephone Encounter (Signed)
I spoke with the patient regarding his lab results. He is aware of Ryan's recommendations as stated below and voices understanding. I inquired if he had his chest x-ray and he states "they didn't have time for that." I advised he should come in and have this done prior to his appointment next week with Luther Parody, but he refused stating he will have this when he comes back on Thursday.

## 2019-09-29 NOTE — Telephone Encounter (Signed)
Patient is calling back for lab results, states he missed two calls Please advise

## 2019-09-29 NOTE — Telephone Encounter (Signed)
Sondra Barges, PA-C  09/28/2019 5:02 PM EDT     -BNP is markedly elevated which is to be expected given his volume overload in the setting of medication events. -His renal function remains mildly elevated though his level improved when compared to prior. -Potassium is at goal. -Liver function has improved and normalized. -Magnesium is normal. -Blood count is normal.  Recommendations: -Continue with planned initiation of torsemide 40 mg daily with close follow-up with our office next week to reassess volume status, trend ReDs vest, labs, and escalate medical therapy as able. -Should he develop any worsening dyspnea recommend he proceed to the ED. -If his renal function worsens with outpatient diuretic therapy, he would likely need inpatient evaluation to assist with augmentation of diuresis. -Await chest x-ray.

## 2019-10-05 ENCOUNTER — Ambulatory Visit: Payer: Medicare Other | Admitting: Family

## 2019-10-12 ENCOUNTER — Ambulatory Visit: Payer: Medicare Other | Admitting: Family

## 2019-10-20 ENCOUNTER — Ambulatory Visit: Payer: Medicare Other | Admitting: Family

## 2019-10-23 ENCOUNTER — Encounter: Payer: Self-pay | Admitting: Family

## 2020-04-20 ENCOUNTER — Emergency Department
Admission: EM | Admit: 2020-04-20 | Discharge: 2020-04-20 | Discharge status: Against Medical Advice | Payer: Medicare Other | Attending: Emergency Medicine | Admitting: Emergency Medicine

## 2020-04-20 ENCOUNTER — Other Ambulatory Visit: Payer: Self-pay

## 2020-04-20 ENCOUNTER — Encounter: Payer: Self-pay | Admitting: Emergency Medicine

## 2020-04-20 DIAGNOSIS — F17200 Nicotine dependence, unspecified, uncomplicated: Secondary | ICD-10-CM | POA: Insufficient documentation

## 2020-04-20 DIAGNOSIS — I5021 Acute systolic (congestive) heart failure: Secondary | ICD-10-CM | POA: Insufficient documentation

## 2020-04-20 DIAGNOSIS — R220 Localized swelling, mass and lump, head: Secondary | ICD-10-CM | POA: Diagnosis present

## 2020-04-20 DIAGNOSIS — I2510 Atherosclerotic heart disease of native coronary artery without angina pectoris: Secondary | ICD-10-CM | POA: Insufficient documentation

## 2020-04-20 DIAGNOSIS — H9201 Otalgia, right ear: Secondary | ICD-10-CM | POA: Insufficient documentation

## 2020-04-20 DIAGNOSIS — R002 Palpitations: Secondary | ICD-10-CM | POA: Insufficient documentation

## 2020-04-20 DIAGNOSIS — Z79899 Other long term (current) drug therapy: Secondary | ICD-10-CM | POA: Insufficient documentation

## 2020-04-20 DIAGNOSIS — N179 Acute kidney failure, unspecified: Secondary | ICD-10-CM | POA: Diagnosis not present

## 2020-04-20 DIAGNOSIS — Z955 Presence of coronary angioplasty implant and graft: Secondary | ICD-10-CM | POA: Insufficient documentation

## 2020-04-20 DIAGNOSIS — R000 Tachycardia, unspecified: Secondary | ICD-10-CM | POA: Insufficient documentation

## 2020-04-20 LAB — CBC
HCT: 43.9 % (ref 39.0–52.0)
Hemoglobin: 15.2 g/dL (ref 13.0–17.0)
MCH: 33.3 pg (ref 26.0–34.0)
MCHC: 34.6 g/dL (ref 30.0–36.0)
MCV: 96.3 fL (ref 80.0–100.0)
Platelets: 150 10*3/uL (ref 150–400)
RBC: 4.56 MIL/uL (ref 4.22–5.81)
RDW: 18.4 % — ABNORMAL HIGH (ref 11.5–15.5)
WBC: 7.7 10*3/uL (ref 4.0–10.5)
nRBC: 0 % (ref 0.0–0.2)

## 2020-04-20 LAB — COMPREHENSIVE METABOLIC PANEL
ALT: 52 U/L — ABNORMAL HIGH (ref 0–44)
AST: 73 U/L — ABNORMAL HIGH (ref 15–41)
Albumin: 3.8 g/dL (ref 3.5–5.0)
Alkaline Phosphatase: 59 U/L (ref 38–126)
Anion gap: 14 (ref 5–15)
BUN: 45 mg/dL — ABNORMAL HIGH (ref 8–23)
CO2: 19 mmol/L — ABNORMAL LOW (ref 22–32)
Calcium: 9.7 mg/dL (ref 8.9–10.3)
Chloride: 103 mmol/L (ref 98–111)
Creatinine, Ser: 2.28 mg/dL — ABNORMAL HIGH (ref 0.61–1.24)
GFR, Estimated: 30 mL/min — ABNORMAL LOW (ref >60)
Glucose, Bld: 104 mg/dL — ABNORMAL HIGH (ref 70–99)
Potassium: 5.3 mmol/L — ABNORMAL HIGH (ref 3.5–5.1)
Sodium: 136 mmol/L (ref 135–145)
Total Bilirubin: 3.2 mg/dL — ABNORMAL HIGH (ref 0.3–1.2)
Total Protein: 7.3 g/dL (ref 6.5–8.1)

## 2020-04-20 MED ORDER — METOPROLOL SUCCINATE ER 25 MG PO TB24: 25.0000 mg | ORAL_TABLET | Freq: Every day | ORAL | 11 refills | Status: DC

## 2020-04-20 MED ORDER — TORSEMIDE 20 MG PO TABS: 40.0000 mg | ORAL_TABLET | Freq: Every day | ORAL | 4 refills | Status: DC

## 2020-04-20 NOTE — ED Triage Notes (Signed)
Pt present to the ED for multiple complaints. Pt has been having right sided facial swelling, body rash for about 2 months, R ear pain, bilateral feet swelling. Pt is A&Ox4 and NAD

## 2020-04-20 NOTE — ED Notes (Signed)
Provided dc instructions. Verbalized understanding.  

## 2020-05-03 NOTE — ED Provider Notes (Signed)
[facility] Emergency Department Provider Note   ____________________________________________    I have reviewed the triage vital signs and the nursing notes.   HISTORY  Chief Complaint Facial Swelling     HPI Curtis Walters is a 70 y.o. male who presents with multiple complaints, primarily is concerned about facial swelling has also had some feet swelling bilaterally and a rash that is itchy for over a month.  Also complains of pain in his right ear.  No shortness of breath reported, no chest pain.  Does report palpitations and heart rate is elevated here.  Denies fevers chills or infectious symptomology   Past Medical History:  Diagnosis Date  . Alcohol use   . Chronic back pain   . Coronary artery disease, non-occlusive   . Dilated aortic root (HCC)   . HFrEF (heart failure with reduced ejection fraction) (HCC)   . NICM (nonischemic cardiomyopathy) (HCC)   . Tobacco abuse   . Transaminitis     Patient Active Problem List   Diagnosis Date Noted  . Acute exacerbation of CHF (congestive heart failure) (HCC) 08/26/2019  . Abnormal LFTs 08/25/2019  . Thrombocytopenia (HCC) 08/25/2019  . Sinus tachycardia 08/25/2019  . Alcohol use 08/25/2019  . Acute systolic CHF (congestive heart failure) (HCC) 08/25/2019  . Dilated cardiomyopathy (HCC) 08/25/2019  . Tobacco abuse   . Elevated troponin     Past Surgical History:  Procedure Laterality Date  . RIGHT AND LEFT HEART CATH N/A 08/28/2019   Procedure: RIGHT AND LEFT HEART CATH with possible percutaneous intervention;  Surgeon: Iran Ouch, MD;  Location: [facility] INVASIVE CV LAB;  Service: Cardiovascular;  Laterality: N/A;    Prior to Admission medications   Medication Sig Start Date End Date Taking? Authorizing Provider  metoprolol succinate (TOPROL XL) 25 MG 24 hr tablet Take 1 tablet (25 mg total) by mouth daily. 04/20/20 04/20/21 Yes Jene Every, MD  torsemide (DEMADEX) 20 MG tablet Take 2  tablets (40 mg total) by mouth daily. 04/20/20 07/19/20  Jene Every, MD     Allergies Penicillins  Family History  Problem Relation Age of Onset  . Sickle cell anemia Sister     Social History Social History   Tobacco Use  . Smoking status: Current Every Day Smoker  . Smokeless tobacco: Never Used  Substance Use Topics  . Alcohol use: Yes  . Drug use: Not Currently    Review of Systems  Constitutional: No fever/chills Eyes: No visual changes.  ENT: No sore throat. Cardiovascular: Denies chest pain.  Palpitations Respiratory: Denies shortness of breath. Gastrointestinal: No abdominal pain.  No nausea, no vomiting.   Genitourinary: Negative for dysuria. Musculoskeletal: As above Skin: As above Neurological: Negative for headaches or weakness   ____________________________________________   PHYSICAL EXAM:  VITAL SIGNS: ED Triage Vitals  Enc Vitals Group     BP 04/20/20 1159 (!) 142/101     Pulse Rate 04/20/20 1205 79     Resp 04/20/20 1159 20     Temp 04/20/20 1208 97.8 F (36.6 C)     Temp Source 04/20/20 1204 Oral     SpO2 04/20/20 1159 99 %     Weight 04/20/20 1205 59 kg (130 lb)     Height 04/20/20 1205 1.854 m (6\' 1" )     Head Circumference --      Peak Flow --      Pain Score 04/20/20 1322 0     Pain Loc --  Pain Edu? --      Excl. in GC? --     Constitutional: Alert and oriented. No acute distress.   Nose: No congestion/rhinnorhea. Mouth/Throat: Mucous membranes are moist.  Small amount of swelling to the right face.  No intraoral lesions  Cardiovascular: Tachycardia, regular rhythm. Grossly normal heart sounds.  Good peripheral circulation. Respiratory: Normal respiratory effort.  No retractions. Lungs CTAB. Gastrointestinal: Soft and nontender. No distention.   Musculoskeletal: Minimal edema bilaterally warm and well perfused Neurologic:  Normal speech and language. No gross focal neurologic deficits are appreciated.  Skin:  Skin is  warm, dry and intact.  Psychiatric: Mood and affect are normal. Speech and behavior are normal.  ____________________________________________   LABS (all labs ordered are listed, but only abnormal results are displayed)  Labs Reviewed  CBC - Abnormal; Notable for the following components:      Result Value   RDW 18.4 (*)    All other components within normal limits  COMPREHENSIVE METABOLIC PANEL - Abnormal; Notable for the following components:   Potassium 5.3 (*)    CO2 19 (*)    Glucose, Bld 104 (*)    BUN 45 (*)    Creatinine, Ser 2.28 (*)    AST 73 (*)    ALT 52 (*)    Total Bilirubin 3.2 (*)    GFR, Estimated 30 (*)    All other components within normal limits   ____________________________________________  EKG  ED ECG REPORT I, Jene Every, the attending physician, personally viewed and interpreted this ECG.  Date: 05/03/2020  Rhythm: Sinus tachycardia QRS Axis: normal Intervals: normal ST/T Wave abnormalities: Nonspecific changes Narrative Interpretation: no evidence of acute ischemia  ____________________________________________  RADIOLOGY  None ____________________________________________   PROCEDURES  Procedure(s) performed: No  Procedures   Critical Care performed: No ____________________________________________   INITIAL IMPRESSION / ASSESSMENT AND PLAN / ED COURSE  Pertinent labs & imaging results that were available during my care of the patient were reviewed by me and considered in my medical decision making (see chart for details).  Patient with multiple complaints as above, mildly tachycardic here but overall reassuring exam with some minimal swelling to the right face.  Lab work is notable for increased BUN and creatinine, consistent with acute kidney injury, likely related to dehydration but unclear.  This combined with the tachycardia led me to recommend fluids and keeping the patient in the hospital however he opted to leave  AGAINST MEDICAL ADVICE.  He does have decisional capacity.    ____________________________________________   FINAL CLINICAL IMPRESSION(S) / ED DIAGNOSES  Final diagnoses:  Acute kidney injury (HCC)  Palpitations        Note:  This document was prepared using Dragon voice recognition software and may include unintentional dictation errors.   Jene Every, MD 05/03/20 1343

## 2020-05-25 ENCOUNTER — Emergency Department: Payer: Medicare Other

## 2020-05-25 ENCOUNTER — Other Ambulatory Visit: Payer: Self-pay

## 2020-05-25 ENCOUNTER — Encounter: Payer: Self-pay | Admitting: Intensive Care

## 2020-05-25 ENCOUNTER — Inpatient Hospital Stay
Admission: EM | Admit: 2020-05-25 | Discharge: 2020-06-03 | DRG: 291 | Disposition: A | Discharge status: Hospice | Payer: Medicare Other | Attending: Internal Medicine | Admitting: Internal Medicine

## 2020-05-25 ENCOUNTER — Inpatient Hospital Stay: Payer: Medicare Other

## 2020-05-25 DIAGNOSIS — R627 Adult failure to thrive: Secondary | ICD-10-CM | POA: Diagnosis present

## 2020-05-25 DIAGNOSIS — I700 Atherosclerosis of aorta: Secondary | ICD-10-CM | POA: Diagnosis present

## 2020-05-25 DIAGNOSIS — T148XXA Other injury of unspecified body region, initial encounter: Secondary | ICD-10-CM

## 2020-05-25 DIAGNOSIS — Z720 Tobacco use: Secondary | ICD-10-CM | POA: Diagnosis not present

## 2020-05-25 DIAGNOSIS — I428 Other cardiomyopathies: Secondary | ICD-10-CM | POA: Diagnosis present

## 2020-05-25 DIAGNOSIS — R188 Other ascites: Secondary | ICD-10-CM | POA: Diagnosis present

## 2020-05-25 DIAGNOSIS — Z789 Other specified health status: Secondary | ICD-10-CM

## 2020-05-25 DIAGNOSIS — F10231 Alcohol dependence with withdrawal delirium: Secondary | ICD-10-CM | POA: Diagnosis not present

## 2020-05-25 DIAGNOSIS — N179 Acute kidney failure, unspecified: Secondary | ICD-10-CM | POA: Diagnosis present

## 2020-05-25 DIAGNOSIS — R739 Hyperglycemia, unspecified: Secondary | ICD-10-CM | POA: Diagnosis present

## 2020-05-25 DIAGNOSIS — R0602 Shortness of breath: Principal | ICD-10-CM

## 2020-05-25 DIAGNOSIS — K709 Alcoholic liver disease, unspecified: Secondary | ICD-10-CM | POA: Diagnosis present

## 2020-05-25 DIAGNOSIS — R945 Abnormal results of liver function studies: Secondary | ICD-10-CM | POA: Diagnosis present

## 2020-05-25 DIAGNOSIS — E872 Acidosis, unspecified: Secondary | ICD-10-CM | POA: Diagnosis present

## 2020-05-25 DIAGNOSIS — J9601 Acute respiratory failure with hypoxia: Secondary | ICD-10-CM | POA: Diagnosis present

## 2020-05-25 DIAGNOSIS — N1832 Chronic kidney disease, stage 3b: Secondary | ICD-10-CM | POA: Diagnosis present

## 2020-05-25 DIAGNOSIS — J90 Pleural effusion, not elsewhere classified: Secondary | ICD-10-CM

## 2020-05-25 DIAGNOSIS — Z515 Encounter for palliative care: Secondary | ICD-10-CM | POA: Diagnosis not present

## 2020-05-25 DIAGNOSIS — M549 Dorsalgia, unspecified: Secondary | ICD-10-CM | POA: Diagnosis present

## 2020-05-25 DIAGNOSIS — Z880 Allergy status to penicillin: Secondary | ICD-10-CM

## 2020-05-25 DIAGNOSIS — I130 Hypertensive heart and chronic kidney disease with heart failure and stage 1 through stage 4 chronic kidney disease, or unspecified chronic kidney disease: Principal | ICD-10-CM | POA: Diagnosis present

## 2020-05-25 DIAGNOSIS — I5023 Acute on chronic systolic (congestive) heart failure: Secondary | ICD-10-CM | POA: Diagnosis present

## 2020-05-25 DIAGNOSIS — K761 Chronic passive congestion of liver: Secondary | ICD-10-CM | POA: Diagnosis present

## 2020-05-25 DIAGNOSIS — I083 Combined rheumatic disorders of mitral, aortic and tricuspid valves: Secondary | ICD-10-CM | POA: Diagnosis present

## 2020-05-25 DIAGNOSIS — R55 Syncope and collapse: Secondary | ICD-10-CM | POA: Diagnosis present

## 2020-05-25 DIAGNOSIS — G928 Other toxic encephalopathy: Secondary | ICD-10-CM | POA: Diagnosis present

## 2020-05-25 DIAGNOSIS — D696 Thrombocytopenia, unspecified: Secondary | ICD-10-CM | POA: Diagnosis present

## 2020-05-25 DIAGNOSIS — E440 Moderate protein-calorie malnutrition: Secondary | ICD-10-CM | POA: Diagnosis present

## 2020-05-25 DIAGNOSIS — Z66 Do not resuscitate: Secondary | ICD-10-CM | POA: Diagnosis not present

## 2020-05-25 DIAGNOSIS — Z9114 Patient's other noncompliance with medication regimen: Secondary | ICD-10-CM

## 2020-05-25 DIAGNOSIS — R7989 Other specified abnormal findings of blood chemistry: Secondary | ICD-10-CM | POA: Diagnosis present

## 2020-05-25 DIAGNOSIS — F17210 Nicotine dependence, cigarettes, uncomplicated: Secondary | ICD-10-CM | POA: Diagnosis present

## 2020-05-25 DIAGNOSIS — I2510 Atherosclerotic heart disease of native coronary artery without angina pectoris: Secondary | ICD-10-CM | POA: Diagnosis present

## 2020-05-25 DIAGNOSIS — R0600 Dyspnea, unspecified: Secondary | ICD-10-CM

## 2020-05-25 DIAGNOSIS — I513 Intracardiac thrombosis, not elsewhere classified: Secondary | ICD-10-CM | POA: Diagnosis present

## 2020-05-25 DIAGNOSIS — I959 Hypotension, unspecified: Secondary | ICD-10-CM | POA: Diagnosis present

## 2020-05-25 DIAGNOSIS — J439 Emphysema, unspecified: Secondary | ICD-10-CM | POA: Diagnosis present

## 2020-05-25 DIAGNOSIS — L89312 Pressure ulcer of right buttock, stage 2: Secondary | ICD-10-CM | POA: Diagnosis present

## 2020-05-25 DIAGNOSIS — Z7289 Other problems related to lifestyle: Secondary | ICD-10-CM

## 2020-05-25 DIAGNOSIS — Z681 Body mass index (BMI) 19 or less, adult: Secondary | ICD-10-CM

## 2020-05-25 DIAGNOSIS — Z79899 Other long term (current) drug therapy: Secondary | ICD-10-CM

## 2020-05-25 DIAGNOSIS — L89322 Pressure ulcer of left buttock, stage 2: Secondary | ICD-10-CM | POA: Diagnosis present

## 2020-05-25 DIAGNOSIS — I77810 Thoracic aortic ectasia: Secondary | ICD-10-CM | POA: Diagnosis present

## 2020-05-25 DIAGNOSIS — R451 Restlessness and agitation: Secondary | ICD-10-CM | POA: Diagnosis present

## 2020-05-25 DIAGNOSIS — Z20822 Contact with and (suspected) exposure to covid-19: Secondary | ICD-10-CM | POA: Diagnosis present

## 2020-05-25 DIAGNOSIS — Z7189 Other specified counseling: Secondary | ICD-10-CM | POA: Diagnosis not present

## 2020-05-25 DIAGNOSIS — I2721 Secondary pulmonary arterial hypertension: Secondary | ICD-10-CM | POA: Diagnosis present

## 2020-05-25 DIAGNOSIS — K760 Fatty (change of) liver, not elsewhere classified: Secondary | ICD-10-CM | POA: Diagnosis present

## 2020-05-25 DIAGNOSIS — M7981 Nontraumatic hematoma of soft tissue: Secondary | ICD-10-CM | POA: Diagnosis not present

## 2020-05-25 DIAGNOSIS — D631 Anemia in chronic kidney disease: Secondary | ICD-10-CM | POA: Diagnosis present

## 2020-05-25 DIAGNOSIS — R413 Other amnesia: Secondary | ICD-10-CM | POA: Diagnosis present

## 2020-05-25 DIAGNOSIS — Z452 Encounter for adjustment and management of vascular access device: Secondary | ICD-10-CM

## 2020-05-25 DIAGNOSIS — L8990 Pressure ulcer of unspecified site, unspecified stage: Secondary | ICD-10-CM | POA: Insufficient documentation

## 2020-05-25 DIAGNOSIS — N19 Unspecified kidney failure: Secondary | ICD-10-CM

## 2020-05-25 DIAGNOSIS — I5021 Acute systolic (congestive) heart failure: Secondary | ICD-10-CM

## 2020-05-25 DIAGNOSIS — K7210 Chronic hepatic failure without coma: Secondary | ICD-10-CM | POA: Diagnosis present

## 2020-05-25 DIAGNOSIS — G8929 Other chronic pain: Secondary | ICD-10-CM | POA: Diagnosis present

## 2020-05-25 DIAGNOSIS — I493 Ventricular premature depolarization: Secondary | ICD-10-CM | POA: Diagnosis present

## 2020-05-25 HISTORY — DX: Essential (primary) hypertension: I10

## 2020-05-25 LAB — COMPREHENSIVE METABOLIC PANEL
ALT: 60 U/L — ABNORMAL HIGH (ref 0–44)
AST: 85 U/L — ABNORMAL HIGH (ref 15–41)
Albumin: 3.2 g/dL — ABNORMAL LOW (ref 3.5–5.0)
Alkaline Phosphatase: 78 U/L (ref 38–126)
Anion gap: 14 (ref 5–15)
BUN: 41 mg/dL — ABNORMAL HIGH (ref 8–23)
CO2: 19 mmol/L — ABNORMAL LOW (ref 22–32)
Calcium: 9.2 mg/dL (ref 8.9–10.3)
Chloride: 105 mmol/L (ref 98–111)
Creatinine, Ser: 2.03 mg/dL — ABNORMAL HIGH (ref 0.61–1.24)
GFR, Estimated: 34 mL/min — ABNORMAL LOW (ref >60)
Glucose, Bld: 87 mg/dL (ref 70–99)
Potassium: 3.7 mmol/L (ref 3.5–5.1)
Sodium: 138 mmol/L (ref 135–145)
Total Bilirubin: 4.1 mg/dL — ABNORMAL HIGH (ref 0.3–1.2)
Total Protein: 6.9 g/dL (ref 6.5–8.1)

## 2020-05-25 LAB — PROTIME-INR
INR: 1.6 — ABNORMAL HIGH (ref 0.8–1.2)
Prothrombin Time: 19.4 seconds — ABNORMAL HIGH (ref 11.4–15.2)

## 2020-05-25 LAB — CBC WITH DIFFERENTIAL/PLATELET
Abs Immature Granulocytes: 0.04 10*3/uL (ref 0.00–0.07)
Basophils Absolute: 0.1 10*3/uL (ref 0.0–0.1)
Basophils Relative: 2 %
Eosinophils Absolute: 0.2 10*3/uL (ref 0.0–0.5)
Eosinophils Relative: 4 %
HCT: 41.5 % (ref 39.0–52.0)
Hemoglobin: 13.9 g/dL (ref 13.0–17.0)
Immature Granulocytes: 1 %
Lymphocytes Relative: 18 %
Lymphs Abs: 1.1 10*3/uL (ref 0.7–4.0)
MCH: 32.4 pg (ref 26.0–34.0)
MCHC: 33.5 g/dL (ref 30.0–36.0)
MCV: 96.7 fL (ref 80.0–100.0)
Monocytes Absolute: 0.7 10*3/uL (ref 0.1–1.0)
Monocytes Relative: 12 %
Neutro Abs: 3.9 10*3/uL (ref 1.7–7.7)
Neutrophils Relative %: 63 %
Platelets: 75 10*3/uL — ABNORMAL LOW (ref 150–400)
RBC: 4.29 MIL/uL (ref 4.22–5.81)
RDW: 18.7 % — ABNORMAL HIGH (ref 11.5–15.5)
WBC: 6 10*3/uL (ref 4.0–10.5)
nRBC: 2.2 % — ABNORMAL HIGH (ref 0.0–0.2)

## 2020-05-25 LAB — MAGNESIUM: Magnesium: 1.8 mg/dL (ref 1.7–2.4)

## 2020-05-25 LAB — LACTIC ACID, PLASMA
Lactic Acid, Venous: 4.6 mmol/L (ref 0.5–1.9)
Lactic Acid, Venous: 4 mmol/L (ref 0.5–1.9)
Lactic Acid, Venous: 5.9 mmol/L (ref 0.5–1.9)
Lactic Acid, Venous: 4.5 mmol/L (ref 0.5–1.9)

## 2020-05-25 LAB — PROCALCITONIN: Procalcitonin: 0.2 ng/mL

## 2020-05-25 LAB — ETHANOL: Alcohol, Ethyl (B): <10 mg/dL (ref <10)

## 2020-05-25 LAB — CBG MONITORING, ED: Glucose-Capillary: 84 mg/dL (ref 70–99)

## 2020-05-25 LAB — TROPONIN I (HIGH SENSITIVITY)
Troponin I (High Sensitivity): 64 ng/L — ABNORMAL HIGH (ref <18)
Troponin I (High Sensitivity): 79 ng/L — ABNORMAL HIGH (ref <18)
Troponin I (High Sensitivity): 80 ng/L — ABNORMAL HIGH (ref <18)
Troponin I (High Sensitivity): 67 ng/L — ABNORMAL HIGH (ref <18)

## 2020-05-25 LAB — TYPE AND SCREEN
ABO/RH(D): B POS
Antibody Screen: NEGATIVE

## 2020-05-25 LAB — BRAIN NATRIURETIC PEPTIDE: B Natriuretic Peptide: >4500 pg/mL — ABNORMAL HIGH (ref 0.0–100.0)

## 2020-05-25 MED ORDER — THIAMINE HCL 100 MG/ML IJ SOLN
100.0000 mg | Freq: Every day | INTRAMUSCULAR | Status: DC
  Administered 2020-05-25 – 2020-05-27 (×3): 100 mg via INTRAVENOUS
  Filled 2020-05-25 (×3): qty 2

## 2020-05-25 MED ORDER — PANTOPRAZOLE SODIUM 40 MG IV SOLR
40.0000 mg | Freq: Once | INTRAVENOUS | Status: AC
  Administered 2020-05-25: 40 mg via INTRAVENOUS
  Filled 2020-05-25: qty 40

## 2020-05-25 MED ORDER — IPRATROPIUM-ALBUTEROL 0.5-2.5 (3) MG/3ML IN SOLN
3.0000 mL | Freq: Once | RESPIRATORY_TRACT | Status: AC
  Administered 2020-05-25: 3 mL via RESPIRATORY_TRACT
  Filled 2020-05-25: qty 3

## 2020-05-25 MED ORDER — ACETAMINOPHEN 650 MG RE SUPP: 650.0000 mg | Freq: Four times a day (QID) | RECTAL | Status: DC | PRN

## 2020-05-25 MED ORDER — IOHEXOL 350 MG/ML SOLN
50.0000 mL | Freq: Once | INTRAVENOUS | Status: AC | PRN
  Administered 2020-05-25: 50 mL via INTRAVENOUS

## 2020-05-25 MED ORDER — ASPIRIN EC 81 MG PO TBEC
81.0000 mg | DELAYED_RELEASE_TABLET | Freq: Every day | ORAL | Status: DC
  Administered 2020-05-25 – 2020-05-28 (×4): 81 mg via ORAL
  Filled 2020-05-25 (×4): qty 1

## 2020-05-25 MED ORDER — ONDANSETRON HCL 4 MG/2ML IJ SOLN
4.0000 mg | Freq: Once | INTRAMUSCULAR | Status: AC
  Administered 2020-05-25: 4 mg via INTRAVENOUS
  Filled 2020-05-25: qty 2

## 2020-05-25 MED ORDER — SODIUM BICARBONATE 8.4 % IV SOLN
INTRAVENOUS | Status: AC
  Administered 2020-05-25: 25 meq via INTRAVENOUS
  Filled 2020-05-25: qty 50

## 2020-05-25 MED ORDER — ACETAMINOPHEN 325 MG PO TABS
650.0000 mg | ORAL_TABLET | Freq: Four times a day (QID) | ORAL | Status: DC | PRN
  Administered 2020-05-27: 650 mg via ORAL
  Filled 2020-05-25: qty 2

## 2020-05-25 MED ORDER — METHYLPREDNISOLONE SODIUM SUCC 125 MG IJ SOLR
125.0000 mg | Freq: Once | INTRAMUSCULAR | Status: AC
  Administered 2020-05-25: 125 mg via INTRAVENOUS
  Filled 2020-05-25: qty 2

## 2020-05-25 MED ORDER — SODIUM BICARBONATE 8.4 % IV SOLN
25.0000 meq | Freq: Once | INTRAVENOUS | Status: AC
  Filled 2020-05-25: qty 50

## 2020-05-25 NOTE — ED Notes (Signed)
Report given to CCU, Melissa RN. Respiratory with pt.

## 2020-05-25 NOTE — ED Provider Notes (Signed)
[facility] Emergency Department Provider Note   ____________________________________________    I have reviewed the triage vital signs and the nursing notes.   HISTORY  Chief Complaint Shortness of Breath     HPI Curtis Walters is a 70 y.o. male with a history of a dilated aortic root who presents with complaints of chest discomfort and shortness of breath.  Patient also describes back pain, he does have a history of chronic back pain however this feels different.  Patient reports he was unable to sleep last night because of discomfort as well as feeling short of breath.  He does smoke cigarettes.  He does have a history of CHF.  Denies lower extremity swelling or edema.  No fevers chills or cough reported  Past Medical History:  Diagnosis Date  . Alcohol use   . Chronic back pain   . Coronary artery disease, non-occlusive   . Dilated aortic root (HCC)   . HFrEF (heart failure with reduced ejection fraction) (HCC)   . Hypertension   . NICM (nonischemic cardiomyopathy) (HCC)   . Tobacco abuse   . Transaminitis     Patient Active Problem List   Diagnosis Date Noted  . Acute exacerbation of CHF (congestive heart failure) (HCC) 08/26/2019  . Abnormal LFTs 08/25/2019  . Thrombocytopenia (HCC) 08/25/2019  . Sinus tachycardia 08/25/2019  . Alcohol use 08/25/2019  . Acute systolic CHF (congestive heart failure) (HCC) 08/25/2019  . Dilated cardiomyopathy (HCC) 08/25/2019  . Tobacco abuse   . Elevated troponin     Past Surgical History:  Procedure Laterality Date  . RIGHT AND LEFT HEART CATH N/A 08/28/2019   Procedure: RIGHT AND LEFT HEART CATH with possible percutaneous intervention;  Surgeon: Iran Ouch, MD;  Location: [facility] INVASIVE CV LAB;  Service: Cardiovascular;  Laterality: N/A;    Prior to Admission medications   Medication Sig Start Date End Date Taking? Authorizing Provider  metoprolol succinate (TOPROL XL) 25 MG 24 hr tablet Take  1 tablet (25 mg total) by mouth daily. 04/20/20 04/20/21  Jene Every, MD  torsemide (DEMADEX) 20 MG tablet Take 2 tablets (40 mg total) by mouth daily. 04/20/20 07/19/20  Jene Every, MD     Allergies Penicillins  Family History  Problem Relation Age of Onset  . Sickle cell anemia Sister     Social History Social History   Tobacco Use  . Smoking status: Current Every Day Smoker    Types: Cigarettes  . Smokeless tobacco: Never Used  Substance Use Topics  . Alcohol use: Yes    Alcohol/week: 35.0 standard drinks    Types: 14 Cans of beer, 21 Shots of liquor per week  . Drug use: Not Currently    Review of Systems  Constitutional: No fever/chills Eyes: No visual changes.  ENT: No sore throat. Cardiovascular: As above Respiratory: As above Gastrointestinal: No abdominal pain.  No nausea, no vomiting.   Genitourinary: Negative for dysuria. Musculoskeletal: Negative for back pain. Skin: Negative for rash. Neurological: Negative for headaches    ____________________________________________   PHYSICAL EXAM:  VITAL SIGNS: ED Triage Vitals  Enc Vitals Group     BP 05/25/20 1124 123/90     Pulse Rate 05/25/20 1124 (!) 114     Resp 05/25/20 1124 18     Temp 05/25/20 1124 97.8 F (36.6 C)     Temp Source 05/25/20 1124 Oral     SpO2 05/25/20 1124 100 %     Weight 05/25/20 1126  63.5 kg (140 lb)     Height 05/25/20 1126 1.854 m (6\' 1" )     Head Circumference --      Peak Flow --      Pain Score 05/25/20 1126 0     Pain Loc --      Pain Edu? --      Excl. in GC? --     Constitutional: Alert and oriented  Nose: No congestion/rhinnorhea. Mouth/Throat: Mucous membranes are moist.   Neck:  Painless ROM Cardiovascular: Normal rate, regular rhythm. Grossly normal heart sounds.  Good peripheral circulation. Respiratory: Normal respiratory effort.  No retractions. Lungs CTAB. Gastrointestinal: Soft and nontender. No distention.  No CVA tenderness.  Musculoskeletal: No  lower extremity tenderness nor edema.  Warm and well perfused Neurologic:  Normal speech and language. No gross focal neurologic deficits are appreciated.  Skin:  Skin is warm, dry and intact. No rash noted. Psychiatric: Mood and affect are normal. Speech and behavior are normal.  ____________________________________________   LABS (all labs ordered are listed, but only abnormal results are displayed)  Labs Reviewed  CBC WITH DIFFERENTIAL/PLATELET - Abnormal; Notable for the following components:      Result Value   RDW 18.7 (*)    Platelets 75 (*)    nRBC 2.2 (*)    All other components within normal limits  COMPREHENSIVE METABOLIC PANEL - Abnormal; Notable for the following components:   CO2 19 (*)    BUN 41 (*)    Creatinine, Ser 2.03 (*)    Albumin 3.2 (*)    AST 85 (*)    ALT 60 (*)    Total Bilirubin 4.1 (*)    GFR, Estimated 34 (*)    All other components within normal limits  LACTIC ACID, PLASMA - Abnormal; Notable for the following components:   Lactic Acid, Venous 4.6 (*)    All other components within normal limits  LACTIC ACID, PLASMA - Abnormal; Notable for the following components:   Lactic Acid, Venous 4.0 (*)    All other components within normal limits  TROPONIN I (HIGH SENSITIVITY) - Abnormal; Notable for the following components:   Troponin I (High Sensitivity) 64 (*)    All other components within normal limits  CULTURE, BLOOD (SINGLE)  BRAIN NATRIURETIC PEPTIDE  CBG MONITORING, ED  TROPONIN I (HIGH SENSITIVITY)   ____________________________________________  EKG  ED ECG REPORT I, 05/27/20, the attending physician, personally viewed and interpreted this ECG.  Date: 05/25/2020  Rhythm: Sinus tachycardia QRS Axis: Left axis deviation Intervals: normal ST/T Wave abnormalities: normal Narrative Interpretation: no evidence of acute ischemia  ____________________________________________  RADIOLOGY  Chest x-ray reviewed by me, no acute  abnormality ____________________________________________   PROCEDURES  Procedure(s) performed: No  Procedures   Critical Care performed: No ____________________________________________   INITIAL IMPRESSION / ASSESSMENT AND PLAN / ED COURSE  Pertinent labs & imaging results that were available during my care of the patient were reviewed by me and considered in my medical decision making (see chart for details).  Patient presents with chest pain, shortness of breath,.  Concerning for possible PE patient is tachycardic.  Also has a history of a dilated aortic root very mild wheezing which could suggest COPD  will give a DuoNeb to see if this helps.  No rales to suggest CHF.  X-ray is reassuring.  We will need to proceed with CT angiography despite IV contrast shortage  CT angiography without evidence of PE, 4 cm ascending aorta  Lactic  acid is elevated at 4, patient is afebrile,, normal white blood cell count.  This is not consistent with sepsis.  No evidence of pneumonia on CT scan.  However he does have tachycardia and continued shortness of breath.  He does have a pleural effusion which is likely the cause of his shortness of breath, will add on blood cultures, he does become hypoxic with ambulation I will admit him to the hospitalist service for further evaluation    ____________________________________________   FINAL CLINICAL IMPRESSION(S) / ED DIAGNOSES  Final diagnoses:  Shortness of breath  Pleural effusion  Elevated lactic acid level  Acute kidney injury [facility])        Note:  This document was prepared using Dragon voice recognition software and may include unintentional dictation errors.   Jene Every, MD 05/25/20 [id number]

## 2020-05-25 NOTE — ED Notes (Signed)
Pt using toilet frequently, reports episodes of diarrhea.

## 2020-05-25 NOTE — ED Notes (Signed)
Pt with periods of apnea noted during sleep, oxygen dips to 79% during apnea. 3 liter's [place] applied.

## 2020-05-25 NOTE — ED Triage Notes (Signed)
Patient c/o sob, weakness, and confusion X1 week

## 2020-05-25 NOTE — ED Notes (Signed)
Per ultrasound tech pt is c/o nausea and dry heaving, MD notified.

## 2020-05-25 NOTE — ED Notes (Signed)
RT paged for ABG

## 2020-05-25 NOTE — ED Notes (Signed)
Unable to access bloodwork orders on pt chart at this time- lab requests grey on ice, red top, lavender top, light green top, and blue top- sent to lab at this time.

## 2020-05-25 NOTE — H&P (Signed)
History and Physical    Rylon Poitra ZOX:[id number] DOB: [birth year] DOA: 05/25/2020  PCP: Patient, No Pcp Per (Inactive)    Patient coming from:  Home.  Chief Complaint:  SOB   HPI: Curtis Walters is a 70 y.o. male seen in ed with complaints of SOB. Pt is alert and oriented but is not able to give clear history.He drinks two drinks a day and  Has had diarrhea for a week. States he cant sleep and is not feeling right. States he usually only has one drink a day and had increased over past few days but does not know why.  Alcohol is about 2 drinks a day - vodka x 3 days, normally he drinks a 1 drink a week. Pt does not have a pcp and did not have a doctor, and last known pcp someone he does not remember. D/w pt about tobacco cessation and he says he knows he has to cut down.  Pt is not dyspneic or having any chest pain and ros is o/w negative.   Pt has past medical history of alcohol abuse, chronic back pain, CAD, ascending aortic aneurysm, reduced ejection fraction heart failure, hypertension, tobacco abuse, PCN allergy.  ED Course:  Vitals:   05/25/20 1500 05/25/20 1600 05/25/20 1645 05/25/20 1730  BP: (!) 132/91  (!) 132/99 (!) 126/101  Pulse: (!) 114 (!) 114 (!) 116 (!) 113  Resp: 16  16 18   Temp:      TempSrc:      SpO2: 95% 96% 95% 96%  Weight:      Height:      In ed pt is alert and oriented and gives history. Vitals as above and shows tachycardia.  cmp shows pco2 of 19,BUN and creatinine that is stable but worse since 2021, AST-85, ALT 60.Lactic acid level of 4.6 and repeat is 4.0, troponin of 64.CBC is normal wbc count and hb of 13.9 and platelet of 2022. CTA of chest: IMPRESSION: 1. No evidence for acute pulmonary embolus. 2. Study was performed with optimal evaluation of pulmonary arteries. However, there is no evidence for displaced intimal calcifications to indicate aortic dissection. 3. 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 Recommend annual  imaging followup by CTA or MRA. This recommendation follows 2010 ACCF/AHA/AATS/ACR/ASA/SCA/SCAI/SIR/STS/SVM Guidelines for the Diagnosis and Management of Patients with Thoracic Aortic Disease. Circulation. 2010; 1212011. Aortic aneurysm NOS (ICD10-I71.9) 4. Coronary artery calcifications. 5.  Aortic atherosclerosis.  (ICD10-I70.0) 6. Stable cardiomegaly. 7. Moderate RIGHT pleural effusion. 8. Hepatic steatosis. 9. Ascites in the UPPER abdomen. 10.  Emphysema (ICD10-J43.9).   Review of Systems:  Review of Systems  Constitutional: Positive for malaise/fatigue.  HENT: Negative.   Eyes: Negative.   Respiratory: Negative.   Cardiovascular: Negative.   Gastrointestinal: Positive for diarrhea.       Diarrhea x 1 week - 7 times. No blood/ dark / watery.   Genitourinary: Positive for frequency.  Musculoskeletal: Negative.   Neurological: Positive for dizziness.  Psychiatric/Behavioral: The patient has insomnia.      Past Medical History:  Diagnosis Date  . Alcohol use   . Chronic back pain   . Coronary artery disease, non-occlusive   . Dilated aortic root (HCC)   . HFrEF (heart failure with reduced ejection fraction) (HCC)   . Hypertension   . NICM (nonischemic cardiomyopathy) (HCC)   . Tobacco abuse   . Transaminitis     Past Surgical History:  Procedure Laterality Date  . RIGHT AND LEFT HEART CATH N/A 08/28/2019  Procedure: RIGHT AND LEFT HEART CATH with possible percutaneous intervention;  Surgeon: Iran Ouch, MD;  Location: [facility] INVASIVE CV LAB;  Service: Cardiovascular;  Laterality: N/A;     reports that he has been smoking cigarettes. He has never used smokeless tobacco. He reports current alcohol use of about 35.0 standard drinks of alcohol per week. He reports previous drug use.  Allergies  Allergen Reactions  . Penicillins Other (See Comments)    Did it involve swelling of the face/tongue/throat, SOB, or low BP? No Did it involve sudden or  severe rash/hives, skin peeling, or any reaction on the inside of your mouth or nose? No Did you need to seek medical attention at a hospital or doctor's office? No When did it last happen? If all above answers are "NO", may proceed with cephalosporin use.    Family History  Problem Relation Age of Onset  . Sickle cell anemia Sister     Prior to Admission medications   Medication Sig Start Date End Date Taking? Authorizing Provider  metoprolol succinate (TOPROL XL) 25 MG 24 hr tablet Take 1 tablet (25 mg total) by mouth daily. 04/20/20 04/20/21  Jene Every, MD  torsemide (DEMADEX) 20 MG tablet Take 2 tablets (40 mg total) by mouth daily. 04/20/20 07/19/20  Jene Every, MD    Physical Exam: Vitals:   05/25/20 1500 05/25/20 1600 05/25/20 1645 05/25/20 1730  BP: (!) 132/91  (!) 132/99 (!) 126/101  Pulse: (!) 114 (!) 114 (!) 116 (!) 113  Resp: 16  16 18   Temp:      TempSrc:      SpO2: 95% 96% 95% 96%  Weight:      Height:       Physical Exam Vitals reviewed.  Constitutional:      General: He is not in acute distress.    Appearance: He is not ill-appearing, toxic-appearing or diaphoretic.  HENT:     Head: Normocephalic and atraumatic.     Right Ear: External ear normal.     Left Ear: External ear normal.     Nose: Nose normal.     Mouth/Throat:     Mouth: Mucous membranes are moist.  Eyes:     Extraocular Movements: Extraocular movements intact.     Pupils: Pupils are equal, round, and reactive to light.  Neck:     Vascular: No carotid bruit.  Cardiovascular:     Rate and Rhythm: Normal rate and regular rhythm.     Pulses: Normal pulses.     Heart sounds: Normal heart sounds.  Pulmonary:     Effort: Pulmonary effort is normal.     Breath sounds: Normal breath sounds.  Abdominal:     General: Bowel sounds are normal. There is no distension.     Palpations: Abdomen is soft.     Tenderness: There is no abdominal tenderness. There is no guarding.  Musculoskeletal:      Cervical back: Normal range of motion.     Right lower leg: No edema.     Left lower leg: No edema.  Neurological:     General: No focal deficit present.     Mental Status: He is alert and oriented to person, place, and time.  Psychiatric:        Mood and Affect: Mood normal.        Behavior: Behavior normal.    Labs on Admission: I have personally reviewed following labs and imaging studies  No results for input(s): CKTOTAL, CKMB,  TROPONINI in the last 72 hours. Lab Results  Component Value Date   WBC 6.0 05/25/2020   HGB 13.9 05/25/2020   HCT 41.5 05/25/2020   MCV 96.7 05/25/2020   PLT 75 (L) 05/25/2020    Recent Labs  Lab 05/25/20 1146  NA 138  K 3.7  CL 105  CO2 19*  BUN 41*  CREATININE 2.03*  CALCIUM 9.2  PROT 6.9  BILITOT 4.1*  ALKPHOS 78  ALT 60*  AST 85*  GLUCOSE 87   Lab Results  Component Value Date   CHOL 192 08/26/2019   HDL 72 08/26/2019   LDLCALC 104 (H) 08/26/2019   TRIG 82 08/26/2019    COVID-19 Labs No results for input(s): DDIMER, FERRITIN, LDH, CRP in the last 72 hours.  Lab Results  Component Value Date   SARSCOV2NAA NEGATIVE 08/25/2019    Radiological Exams on Admission: DG Chest 2 View  Result Date: 05/25/2020 CLINICAL DATA:  Shortness of breath.  Diarrhea and weakness. EXAM: CHEST - 2 VIEW COMPARISON:  08/25/2019 FINDINGS: The heart is enlarged, stable in configuration. Small RIGHT pleural effusion or pleural thickening may be chronic. There is no pulmonary edema. No consolidations. IMPRESSION: Stable cardiomegaly. Stable RIGHT pleural thickening or pleural effusion. Electronically Signed   By: Norva Pavlov M.D.   On: 05/25/2020 12:16   CT ANGIO CHEST PE W OR WO CONTRAST  Result Date: 05/25/2020 CLINICAL DATA:  Shortness of breath. Chest pain or back pain. Suspect aortic dissection. Shortness of breath, worse lying flat. Weakness and confusion. EXAM: CT ANGIOGRAPHY CHEST WITH CONTRAST TECHNIQUE: Multidetector CT imaging of  the chest was performed using the standard protocol during bolus administration of intravenous contrast. Multiplanar CT image reconstructions and MIPs were obtained to evaluate the vascular anatomy. The technologist discussed the patient with Dr. Cyril Loosen prior to exam. Request was made for optimal evaluation of pulmonary arteries. CONTRAST:  54mL OMNIPAQUE IOHEXOL 350 MG/ML SOLN COMPARISON:  08/25/2019 FINDINGS: Cardiovascular: Stable cardiomegaly. No pericardial effusion. Coronary artery calcifications are present. There is atherosclerotic calcification of the thoracic aorta. Ascending aorta measures 4.0 centimeters. No displaced intimal calcifications indicate presence of dissection. The contrast bolus was timed for optimal opacification of the pulmonary arteries. There is no evidence for acute pulmonary embolism. Mediastinum/Nodes: The visualized portion of the thyroid gland has a normal appearance. The esophagus is normal in appearance. No significant mediastinal, hilar, or axillary adenopathy. Lungs/Pleura: Moderate RIGHT pleural effusion. Scattered panlobular emphysematous changes. No suspicious pulmonary nodules. No consolidations. Upper Abdomen: The liver is diffusely low attenuation, consistent with hepatic steatosis. Ascites identified in the UPPER abdomen around the spleen and liver. Musculoskeletal: No chest wall abnormality. No acute or significant osseous findings. Review of the MIP images confirms the above findings. IMPRESSION: 1. No evidence for acute pulmonary embolus. 2. Study was performed with optimal evaluation of pulmonary arteries. However, there is no evidence for displaced intimal calcifications to indicate aortic dissection. 3. 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 Recommend annual imaging followup by CTA or MRA. This recommendation follows 2010 ACCF/AHA/AATS/ACR/ASA/SCA/SCAI/SIR/STS/SVM Guidelines for the Diagnosis and Management of Patients with Thoracic Aortic  Disease. Circulation. 2010; 121: W263-Z858. Aortic aneurysm NOS (ICD10-I71.9) 4. Coronary artery calcifications. 5.  Aortic atherosclerosis.  (ICD10-I70.0) 6. Stable cardiomegaly. 7. Moderate RIGHT pleural effusion. 8. Hepatic steatosis. 9. Ascites in the UPPER abdomen. 10.  Emphysema (ICD10-J43.9). Electronically Signed   By: Norva Pavlov M.D.   On: 05/25/2020 14:49    EKG: Independently reviewed.  SR at 116, TWI in  v6, LAE.   Echocardiogram 08/2019: 1. Left ventricular ejection fraction, by estimation, is 20 to 25%. The  left ventricle has severely decreased function. The left ventricle has no  regional wall motion abnormalities. The left ventricular internal cavity  size was moderately dilated.  Indeterminate diastolic filling due to E-A fusion.  2. Right ventricular systolic function is mildly reduced. The right  ventricular size is mildly enlarged. There is severely elevated pulmonary  artery systolic pressure. The estimated right ventricular systolic  pressure is 72.1 mmHg.  3. Left atrial size was moderately dilated.  4. Right atrial size was moderately dilated.  5. Moderate mitral valve regurgitation.  6. Tricuspid valve regurgitation is moderate.  7. Aortic valve regurgitation is mild to moderate. stenosis.  8. The inferior vena cava is dilated in size with <50% respiratory  variability, suggesting right atrial pressure of 15 mmHg.    Assessment/Plan Principal Problem:   SOB (shortness of breath) Active Problems:   Acute systolic CHF (congestive heart failure) (HCC)   Abnormal LFTs   Thrombocytopenia (HCC)   Tobacco abuse   Alcohol use   Lactic acidosis   SOB/ A/C systolic chf: We will start pt on lasix 20 mg iv once bnp is resulted. Strict I/O, weights.cardiology consulted for management  If  cardiorenal syndrome with platelet going down . Metoprolol to be continued if BP and heart rate allows.  Abnormal LFT: Attribute to alcohol level. Level is  pending.  Also could be contributing to his elevated lactic acid level.   Thrombocytopenia: Attribute to his alcohol  Abuse.  Thiamine daily.  Hold heparin and scd's for dvt prophylaxis. monitor for any bleeding.   Tobacco abuse: D/w pt about tobacco cessation. And nicotine patch.   Alcohol use: Level is pending. CIWA protocol.  Lactic acidosis: Suspect from liver but cultures have been collected.    DVT prophylaxis:  SCD's  Code Status:  Full code  Family Communication:  Girtha Rm (Significant other)  [phone] (Home Phone)  Disposition Plan:  Home  Consults called:  Cardiology-Dr.Camnitz.  Admission status: Inpatient.     Gertha Calkin MD [facility] [phone] How to contact the [facility] Attending or Consulting provider 7A - 7P or covering provider during after hours 7P -7A, for this patient.    1. Check the care team in [facility] and look for a) attending/consulting [facility] provider listed and b) the [facility] team listed 2. Log into [email or web address] and use [facility]'s universal password to access. If you do not have the password, please contact the hospital operator. 3. Locate the [facility] provider you are looking for under [facility] and page to a number that you can be directly reached. 4. If you still have difficulty reaching the provider, please page the [facility] (Director on Call) for the Hospitalists listed on amion for assistance. [email or web address] Password [facility] 05/25/2020, 6:04 PM

## 2020-05-25 NOTE — ED Notes (Signed)
Pt with CT. 

## 2020-05-25 NOTE — ED Notes (Addendum)
Pt's daughterElmarie [phone] updated.

## 2020-05-25 NOTE — ED Notes (Signed)
Urine sample sent to lab

## 2020-05-25 NOTE — ED Notes (Signed)
Pt c/o trouble breathing at night, frequent urination, and weakness. Pt is AOX4, NAD noted. Pt denies pain.

## 2020-05-26 LAB — COMPREHENSIVE METABOLIC PANEL
ALT: 60 U/L — ABNORMAL HIGH (ref 0–44)
AST: 84 U/L — ABNORMAL HIGH (ref 15–41)
Albumin: 3 g/dL — ABNORMAL LOW (ref 3.5–5.0)
Alkaline Phosphatase: 74 U/L (ref 38–126)
Anion gap: 12 (ref 5–15)
BUN: 42 mg/dL — ABNORMAL HIGH (ref 8–23)
CO2: 20 mmol/L — ABNORMAL LOW (ref 22–32)
Calcium: 8.9 mg/dL (ref 8.9–10.3)
Chloride: 107 mmol/L (ref 98–111)
Creatinine, Ser: 2.15 mg/dL — ABNORMAL HIGH (ref 0.61–1.24)
GFR, Estimated: 32 mL/min — ABNORMAL LOW (ref >60)
Glucose, Bld: 203 mg/dL — ABNORMAL HIGH (ref 70–99)
Potassium: 4 mmol/L (ref 3.5–5.1)
Sodium: 139 mmol/L (ref 135–145)
Total Bilirubin: 3.7 mg/dL — ABNORMAL HIGH (ref 0.3–1.2)
Total Protein: 6.2 g/dL — ABNORMAL LOW (ref 6.5–8.1)

## 2020-05-26 LAB — C-REACTIVE PROTEIN: CRP: 2.1 mg/dL — ABNORMAL HIGH (ref <1.0)

## 2020-05-26 LAB — SARS CORONAVIRUS 2 (TAT 6-24 HRS): SARS Coronavirus 2: NEGATIVE

## 2020-05-26 LAB — CBC
HCT: 39.3 % (ref 39.0–52.0)
Hemoglobin: 13.3 g/dL (ref 13.0–17.0)
MCH: 32.5 pg (ref 26.0–34.0)
MCHC: 33.8 g/dL (ref 30.0–36.0)
MCV: 96.1 fL (ref 80.0–100.0)
Platelets: 67 10*3/uL — ABNORMAL LOW (ref 150–400)
RBC: 4.09 MIL/uL — ABNORMAL LOW (ref 4.22–5.81)
RDW: 18.6 % — ABNORMAL HIGH (ref 11.5–15.5)
WBC: 3.1 10*3/uL — ABNORMAL LOW (ref 4.0–10.5)
nRBC: 2.6 % — ABNORMAL HIGH (ref 0.0–0.2)

## 2020-05-26 LAB — TSH: TSH: 2.375 u[IU]/mL (ref 0.350–4.500)

## 2020-05-26 LAB — VITAMIN B12: Vitamin B-12: 1487 pg/mL — ABNORMAL HIGH (ref 180–914)

## 2020-05-26 LAB — URINALYSIS, COMPLETE (UACMP) WITH MICROSCOPIC
Bacteria, UA: NONE SEEN
Bilirubin Urine: NEGATIVE
Glucose, UA: NEGATIVE mg/dL
Ketones, ur: NEGATIVE mg/dL
Leukocytes,Ua: NEGATIVE
Nitrite: NEGATIVE
Protein, ur: 100 mg/dL — AB
Specific Gravity, Urine: 1.039 — ABNORMAL HIGH (ref 1.005–1.030)
pH: 5 (ref 5.0–8.0)

## 2020-05-26 LAB — HEMOGLOBIN A1C
Hgb A1c MFr Bld: 5.3 % (ref 4.8–5.6)
Mean Plasma Glucose: 105.41 mg/dL

## 2020-05-26 LAB — MRSA PCR SCREENING: MRSA by PCR: NEGATIVE

## 2020-05-26 LAB — GLUCOSE, CAPILLARY: Glucose-Capillary: 198 mg/dL — ABNORMAL HIGH (ref 70–99)

## 2020-05-26 LAB — BETA-HYDROXYBUTYRIC ACID: Beta-Hydroxybutyric Acid: 0.92 mmol/L — ABNORMAL HIGH (ref 0.05–0.27)

## 2020-05-26 LAB — CK: Total CK: 96 U/L (ref 49–397)

## 2020-05-26 MED ORDER — CARVEDILOL 6.25 MG PO TABS: 6.2500 mg | ORAL_TABLET | Freq: Two times a day (BID) | ORAL | Status: DC

## 2020-05-26 MED ORDER — FUROSEMIDE 10 MG/ML IJ SOLN
40.0000 mg | Freq: Two times a day (BID) | INTRAMUSCULAR | Status: DC
  Administered 2020-05-26: 40 mg via INTRAVENOUS
  Filled 2020-05-26: qty 4

## 2020-05-26 MED ORDER — FUROSEMIDE 10 MG/ML IJ SOLN
40.0000 mg | Freq: Two times a day (BID) | INTRAMUSCULAR | Status: DC
  Administered 2020-05-26 – 2020-05-27 (×2): 40 mg via INTRAVENOUS
  Filled 2020-05-26 (×2): qty 4

## 2020-05-26 MED ORDER — POTASSIUM CHLORIDE CRYS ER 20 MEQ PO TBCR
20.0000 meq | EXTENDED_RELEASE_TABLET | Freq: Two times a day (BID) | ORAL | Status: DC
  Administered 2020-05-26 – 2020-05-27 (×2): 20 meq via ORAL
  Filled 2020-05-26 (×2): qty 1

## 2020-05-26 MED ORDER — CHLORHEXIDINE GLUCONATE CLOTH 2 % EX PADS
6.0000 | MEDICATED_PAD | Freq: Every day | CUTANEOUS | Status: DC
  Administered 2020-05-26 – 2020-05-29 (×4): 6 via TOPICAL

## 2020-05-26 NOTE — Progress Notes (Signed)
Patient transported to room 245 via wheel chair and 3L [place]. Patient belongings kept at bedside sent with patient along with  documentation of money locked in hospital safe. RN updated at bedside.

## 2020-05-26 NOTE — Progress Notes (Addendum)
Upon admission to ICU patient requested that we put his cash in the safe. Belongings envelope [id number], total of [phone]$ sent to safe with security. Awaiting key from security to place on patient's chart.

## 2020-05-26 NOTE — Progress Notes (Signed)
Webb Silversmith, NP notified that patient has elevated BNP >4500 and Lactic acid 4.5,  Patient also has not voided since arrival to ICU at 0000. Bladder scan did show 269 ml and patient denied the need to void at that time. Acknowledged with no new orders received. Continue to monitor patient closely.

## 2020-05-26 NOTE — Progress Notes (Signed)
PROGRESS NOTE    Curtis Walters  ZOX:[id number] DOB: July 04, 1950 DOA: 05/25/2020 PCP: Patient, No Pcp Per (Inactive)  Brief Narrative: 70 year old male with history of nonischemic cardiomyopathy, EF of 20%, mild pulmonary hypertension, moderate MR/TR, mild to moderate AAS, alcohol abuse, alcoholic liver disease, tobacco abuse, chronic back pain presented to the ED with dyspnea.,  He is a very poor historian with significant cognitive and memory deficits as well, could not remember that he had any cardiac disease. -In the emergency room labs were notable for creatinine of 2.0, mildly elevated LFTs, platelet of 75, lactate of 4.6, BNP greater than 4500, CTA without PE, noted ascites, hepatic steatosis, emphysema, moderate right pleural effusion   Assessment & Plan:   Acute on chronic systolic CHF Moderate MR, pulmonary hypertension, moderate TR Moderate right pleural effusion Ascites -Noncompliance with diuretics, likely diet as well and unfortunately his chronic liver disease also contributing to volume overload -Start IV Lasix today, monitor urine output, weights -If kidney function remains stable could attempt paracentesis in 1 to 2 days -Monitor kidney function closely  AKI on CKD 3 -Baseline creatinine around 1.4 -On admission creatinine was 2.3 yesterday, likely cardiorenal -Monitor with diuresis  Alcoholic liver disease Passive hepatic congestion also likely contributing to liver damage -Counseled regarding alcoholism -Add thiamine, monitor for withdrawal  Tachycardia, PVCs -Restart Coreg at a lower dose  Memory, significant cognitive deficits -Check B12 and TSH  Hyperglycemia -Check hemoglobin A1c  Thrombocytopenia -Secondary to chronic liver disease, alcoholism  Tobacco abuse -Counseled  Moderate protein calorie malnutrition -Supplements as tolerated  DVT prophylaxis: SCDs due to thrombocytopenia Code Status: Full code Family Communication: No family at  bedside Disposition Plan:  Status is: Inpatient  Remains inpatient appropriate because:Inpatient level of care appropriate due to severity of illness   Dispo: The patient is from: Home              Anticipated d/c is to: Home              Patient currently is not medically stable to d/c.   Difficult to place patient No  Consultants:   Cardiology   Procedures:   Antimicrobials:    Subjective: -Hungry, wants to eat, has significant memory issues, does not recall ever hearing that he has heart disease or CHF  Objective: Vitals:   05/26/20 0803 05/26/20 0900 05/26/20 1000 05/26/20 1100  BP:  (!) 116/95 118/86 (!) 114/92  Pulse:  (!) 102 (!) 102 (!) 104  Resp:  12 17 15   Temp: 97.7 F (36.5 C)     TempSrc: Oral     SpO2:  (!) 89% 100% 99%  Weight:      Height:        Intake/Output Summary (Last 24 hours) at 05/26/2020 1107 Last data filed at 05/26/2020 1100 Gross per 24 hour  Intake --  Output 100 ml  Net -100 ml   Filed Weights   05/25/20 1126 05/26/20 0015  Weight: 63.5 kg 60.8 kg    Examination:  General exam: Appears calm and comfortable  Respiratory system: Clear to auscultation. Respiratory effort normal. Cardiovascular system: S1 & S2 heard, RRR. No JVD, murmurs, rubs, gallops Gastrointestinal system: Abdomen is nondistended, soft and nontender.Normal bowel sounds heard. Central nervous system: Alert and oriented. No focal neurological deficits. Extremities: Symmetric 5 x 5 power. Skin: No rashes, lesions or ulcers Psychiatry: Judgement and insight appear normal. Mood & affect appropriate.     Data Reviewed:   CBC: Recent Labs  Lab  05/25/20 1146 05/26/20 0554  WBC 6.0 3.1*  NEUTROABS 3.9  --   HGB 13.9 13.3  HCT 41.5 39.3  MCV 96.7 96.1  PLT 75* 67*   Basic Metabolic Panel: Recent Labs  Lab 05/25/20 1146 05/25/20 2315 05/26/20 0554  NA 138  --  139  K 3.7  --  4.0  CL 105  --  107  CO2 19*  --  20*  GLUCOSE 87  --  203*  BUN 41*   --  42*  CREATININE 2.03*  --  2.15*  CALCIUM 9.2  --  8.9  MG  --  1.8  --    GFR: Estimated Creatinine Clearance: 27.1 mL/min (A) (by C-G formula based on SCr of 2.15 mg/dL (H)). Liver Function Tests: Recent Labs  Lab 05/25/20 1146 05/26/20 0554  AST 85* 84*  ALT 60* 60*  ALKPHOS 78 74  BILITOT 4.1* 3.7*  PROT 6.9 6.2*  ALBUMIN 3.2* 3.0*   No results for input(s): LIPASE, AMYLASE in the last 168 hours. No results for input(s): AMMONIA in the last 168 hours. Coagulation Profile: Recent Labs  Lab 05/25/20 2315  INR 1.6*   Cardiac Enzymes: Recent Labs  Lab 05/26/20 0554  CKTOTAL 96   BNP (last 3 results) No results for input(s): PROBNP in the last 8760 hours. HbA1C: No results for input(s): HGBA1C in the last 72 hours. CBG: Recent Labs  Lab 05/25/20 1141 05/26/20 0004  GLUCAP 84 198*   Lipid Profile: No results for input(s): CHOL, HDL, LDLCALC, TRIG, CHOLHDL, LDLDIRECT in the last 72 hours. Thyroid Function Tests: No results for input(s): TSH, T4TOTAL, FREET4, T3FREE, THYROIDAB in the last 72 hours. Anemia Panel: No results for input(s): VITAMINB12, FOLATE, FERRITIN, TIBC, IRON, RETICCTPCT in the last 72 hours. Urine analysis:    Component Value Date/Time   COLORURINE AMBER (A) 05/26/2020 0538   APPEARANCEUR CLEAR (A) 05/26/2020 0538   LABSPEC 1.039 (H) 05/26/2020 0538   PHURINE 5.0 05/26/2020 0538   GLUCOSEU NEGATIVE 05/26/2020 0538   HGBUR SMALL (A) 05/26/2020 0538   BILIRUBINUR NEGATIVE 05/26/2020 0538   KETONESUR NEGATIVE 05/26/2020 0538   PROTEINUR 100 (A) 05/26/2020 0538   NITRITE NEGATIVE 05/26/2020 0538   LEUKOCYTESUR NEGATIVE 05/26/2020 0538   Sepsis Labs: @LABRCNTIP (procalcitonin:4,lacticidven:4)  ) Recent Results (from the past 240 hour(s))  Blood culture (single)     Status: None (Preliminary result)   Collection Time: 05/25/20  3:16 PM   Specimen: BLOOD  Result Value Ref Range Status   Specimen Description BLOOD Blood Culture  adequate volume  Final   Special Requests   Final    BOTTLES DRAWN AEROBIC AND ANAEROBIC BLOOD RIGHT FOREARM   Culture   Final    NO GROWTH < 12 HOURS Performed at [facility], [address] [place], [place] [address]    Report Status PENDING  Incomplete  SARS CORONAVIRUS 2 (TAT 6-24 HRS) Nasopharyngeal Nasopharyngeal Swab     Status: None   Collection Time: 05/25/20  3:21 PM   Specimen: Nasopharyngeal Swab  Result Value Ref Range Status   SARS Coronavirus 2 NEGATIVE NEGATIVE Final    Comment: (NOTE) SARS-CoV-2 target nucleic acids are NOT DETECTED.  The SARS-CoV-2 RNA is generally detectable in upper and lower respiratory specimens during the acute phase of infection. Negative results do not preclude SARS-CoV-2 infection, do not rule out co-infections with other pathogens, and should not be used as the sole basis for treatment or other patient management decisions. Negative results must  be combined with clinical observations, patient history, and epidemiological information. The expected result is Negative.  Fact Sheet for Patients: HairSlick.no  Fact Sheet for Healthcare Providers: quierodirigir.com  This test is not yet approved or cleared by the [place] FDA and  has been authorized for detection and/or diagnosis of SARS-CoV-2 by FDA under an Emergency Use Authorization (EUA). This EUA will remain  in effect (meaning this test can be used) for the duration of the COVID-19 declaration under Se ction 564(b)(1) of the Act, 21 U.S.C. section 360bbb-3(b)(1), unless the authorization is terminated or revoked sooner.  Performed at [facility], [address] [place], [place] [address]   MRSA PCR Screening     Status: None   Collection Time: 05/26/20 12:10 AM   Specimen: Nasal Mucosa; Nasopharyngeal  Result Value Ref Range Status   MRSA by PCR NEGATIVE NEGATIVE Final    Comment:        The  GeneXpert MRSA Assay (FDA approved for NASAL specimens only), is one component of a comprehensive MRSA colonization surveillance program. It is not intended to diagnose MRSA infection nor to guide or monitor treatment for MRSA infections. Performed at [facility], [address] [place], [place] [address]          Radiology Studies: DG Chest 2 View  Result Date: 05/25/2020 CLINICAL DATA:  Shortness of breath.  Diarrhea and weakness. EXAM: CHEST - 2 VIEW COMPARISON:  08/25/2019 FINDINGS: The heart is enlarged, stable in configuration. Small RIGHT pleural effusion or pleural thickening may be chronic. There is no pulmonary edema. No consolidations. IMPRESSION: Stable cardiomegaly. Stable RIGHT pleural thickening or pleural effusion. Electronically Signed   By: Norva Pavlov M.D.   On: 05/25/2020 12:16   CT ANGIO CHEST PE W OR WO CONTRAST  Result Date: 05/25/2020 CLINICAL DATA:  Shortness of breath. Chest pain or back pain. Suspect aortic dissection. Shortness of breath, worse lying flat. Weakness and confusion. EXAM: CT ANGIOGRAPHY CHEST WITH CONTRAST TECHNIQUE: Multidetector CT imaging of the chest was performed using the standard protocol during bolus administration of intravenous contrast. Multiplanar CT image reconstructions and MIPs were obtained to evaluate the vascular anatomy. The technologist discussed the patient with Dr. Cyril Loosen prior to exam. Request was made for optimal evaluation of pulmonary arteries. CONTRAST:  73mL OMNIPAQUE IOHEXOL 350 MG/ML SOLN COMPARISON:  08/25/2019 FINDINGS: Cardiovascular: Stable cardiomegaly. No pericardial effusion. Coronary artery calcifications are present. There is atherosclerotic calcification of the thoracic aorta. Ascending aorta measures 4.0 centimeters. No displaced intimal calcifications indicate presence of dissection. The contrast bolus was timed for optimal opacification of the pulmonary arteries. There is no evidence for  acute pulmonary embolism. Mediastinum/Nodes: The visualized portion of the thyroid gland has a normal appearance. The esophagus is normal in appearance. No significant mediastinal, hilar, or axillary adenopathy. Lungs/Pleura: Moderate RIGHT pleural effusion. Scattered panlobular emphysematous changes. No suspicious pulmonary nodules. No consolidations. Upper Abdomen: The liver is diffusely low attenuation, consistent with hepatic steatosis. Ascites identified in the UPPER abdomen around the spleen and liver. Musculoskeletal: No chest wall abnormality. No acute or significant osseous findings. Review of the MIP images confirms the above findings. IMPRESSION: 1. No evidence for acute pulmonary embolus. 2. Study was performed with optimal evaluation of pulmonary arteries. However, there is no evidence for displaced intimal calcifications to indicate aortic dissection. 3. 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 Recommend annual imaging followup by CTA or MRA. This recommendation follows 2010 ACCF/AHA/AATS/ACR/ASA/SCA/SCAI/SIR/STS/SVM Guidelines for the Diagnosis and Management  of Patients with Thoracic Aortic Disease. Circulation. 2010; 121: F818-E993. Aortic aneurysm NOS (ICD10-I71.9) 4. Coronary artery calcifications. 5.  Aortic atherosclerosis.  (ICD10-I70.0) 6. Stable cardiomegaly. 7. Moderate RIGHT pleural effusion. 8. Hepatic steatosis. 9. Ascites in the UPPER abdomen. 10.  Emphysema (ICD10-J43.9). Electronically Signed   By: Norva Pavlov M.D.   On: 05/25/2020 14:49   US Abdomen Complete  Result Date: 05/25/2020 CLINICAL DATA:  Ascites, renal failure and hepatic steatosis EXAM: ABDOMEN ULTRASOUND COMPLETE COMPARISON:  Same day CT chest. FINDINGS: Gallbladder: No gallstones or wall thickening visualized. No sonographic Murphy sign noted by sonographer. Common bile duct: Diameter: 7 mm which is within normal limits for patient's age. Liver: No focal lesion identified. Coarsened echotexture  with increased parenchymal echogenicity. Portal vein is patent on color Doppler imaging with normal direction of blood flow towards the liver. IVC: No abnormality visualized. Pancreas: Visualized portion unremarkable. Spleen: Size and appearance within normal limits. Right Kidney: Length: 9.9 cm. Echogenicity within normal limits. No mass or hydronephrosis visualized. Left Kidney: Length: 10.1 cm. Echogenicity within normal limits. 1.6 cm renal cyst. No solid mass or hydronephrosis visualized. Abdominal aorta: No aneurysm visualized. Other findings: Small volume ascites. IMPRESSION: Coarsened echotexture with increased parenchymal echogenicity suggesting fatty infiltration or other diffuse hepatocellular disease. Small volume ascites. Electronically Signed   By: Maudry Mayhew MD   On: 05/25/2020 21:12        Scheduled Meds: . aspirin EC  81 mg Oral Daily  . Chlorhexidine Gluconate Cloth  6 each Topical Daily  . furosemide  40 mg Intravenous Q12H  . potassium chloride  20 mEq Oral BID  . thiamine injection  100 mg Intravenous Daily   Continuous Infusions:   LOS: 1 day    Time spent:  Zannie Cove, MD [facility]  05/26/2020, 11:07 AM

## 2020-05-26 NOTE — Progress Notes (Signed)
Patient admitted to ICU bed 19 with shortness of breath. Pt arrived wearing 3L oxygen via [place]. Pt denies pain on admission. Pt reported having cash that would need to be locked in hospital safe. Doreen Salvage, RN verified contents sent with security; copy of contents and key placed in patients chart. Patients clothes, shoes, and cane kept at bedside.

## 2020-05-26 NOTE — Progress Notes (Signed)
Patient placed on airborne/contact isolation due to covid results pending. Patient will need to be on isolation until the results of pending COVID test are known.

## 2020-05-26 NOTE — Progress Notes (Signed)
Patient transferred to room 245 from ICU. Oriented patient to room and room equipment. Patient alert and oriented. Explained safety protocol and bed alarm will be turned on to remind him to call for assistance. Will continue to monitor to end of shift.

## 2020-05-26 NOTE — Evaluation (Signed)
Occupational Therapy Evaluation Patient Details Name: Curtis Walters MRN: [id number] DOB: 31-Oct-1950 Today's Date: 05/26/2020    History of Present Illness 70 year old male with history of nonischemic cardiomyopathy, EF of 20%, mild pulmonary hypertension, alcohol abuse, alcoholic liver disease, tobacco abuse, chronic back pain, who presented to the ED with dyspnea. In the ED, labs were notable for creatinine of 2.0, mildly elevated LFTs, platelet of 75, lactate of 4.6, BNP greater than 4500, CTA without PE, noted ascites, hepatic steatosis, emphysema, moderate right pleural effusion   Clinical Impression   Pt seen for OT evaluation on this date. Upon arrival to room, pt awake in bed on RA, with RN at bedside. Pt A&Ox1 and reporting no pain. Pt agreeable to OT evaluation, however unable to communicate prior level of function with ADLs/functional mobility prior to admission. PLOF obtained via phone call with pt's significant other. Prior to admission, pt was living alone (significant other cares for her brother in another home), using a SPC for functional mobility of short household distance (max distance was walking from bedroom to front door) and was independent with ADLs (although required increased time/effort). Per pt's significant other, pt had not eaten for 5 days prior to admission.  This date, pt performed bed-level LB dressing and supine<>sit transfers MOD-I, requiring increased time/effort only. Of note, SpO2 desat 85% following scooting trunk towards HOB during bed mobility, however able to recover to >95% following cessation of activity; RN informed. While sitting EOB, pt required MOD A to don posterior hospital gown. Pt was only able to tolerate sitting EOB for ~3 mins, before complaining of dizziness and requesting to return to supine. Pt currently presents with decreased awareness of deficits, balance, strength, and activity tolerance and would benefit from additional skilled OT services to  maximize return to PLOF and minimize risk of future falls, injury, caregiver burden, and readmission. Discharge recommendation (SNF vs home health) pending progressive functional mobility.    Follow Up Recommendations  Other (comment) (SNF vs home health pending progressive mobility)    Equipment Recommendations  Other (comment) (TBD)           Mobility Bed Mobility Overal bed mobility: Modified Independent             General bed mobility comments: Pt able to perform supine<>sit transfers, requiring increased time/effort only. SpO2 desat 85% following scooting trunk towards HOB, however able to recover to >95% following cessation of activity. RN informed    Transfers Overall transfer level: Needs assistance   Transfers: Lateral/Scoot Transfers          Lateral/Scoot Transfers: Min guard      Balance Overall balance assessment: Needs assistance Sitting-balance support: No upper extremity supported;Feet supported Sitting balance-Leahy Scale: Good Sitting balance - Comments: Good sitting balance at EOB reaching within BOS       Standing balance comment: not assessed d/t pt reported dizziness while sitting EOB, however pt able to perform lateral scoot towards [facility] without physical assistance                           ADL either performed or assessed with clinical judgement   ADL Overall ADL's : Needs assistance/impaired     Grooming: Wash/dry face;Set up;Bed level Grooming Details (indicate cue type and reason): to wash face with washcloth         Upper Body Dressing : Moderate assistance;Sitting Upper Body Dressing Details (indicate cue type and reason): MOD A to don posterior  hospital gown Lower Body Dressing: Supervision/safety;Set up;Bed level Lower Body Dressing Details (indicate cue type and reason): Able to don/doff socks at bed-level. No physical assistance required                                  Pertinent Vitals/Pain Pain  Assessment: No/denies pain        Extremity/Trunk Assessment Upper Extremity Assessment Upper Extremity Assessment: Generalized weakness   Lower Extremity Assessment Lower Extremity Assessment: Generalized weakness       Communication Communication Communication: No difficulties   Cognition Arousal/Alertness: Awake/alert Behavior During Therapy: WFL for tasks assessed/performed Overall Cognitive Status: Impaired/Different from baseline                                 General Comments: A&Ox1. Pleasantly confused              Home Living Family/patient expects to be discharged to:: Private residence Living Arrangements: Alone Available Help at Discharge: Family;Available PRN/intermittently (Pt's signficant other is a caregiver for her brother and lives in a separate house (checks in on pt 3-4x/week). Pt's son and daughter do not live in [place]) Type of Home: Apartment Home Access: Level entry     Home Layout: One level     Bathroom Shower/Tub: Producer, television/film/video: Handicapped height Bathroom Accessibility: Yes   Home Equipment: Cane - single point;Grab bars - tub/shower   Additional Comments: PLOF obtained from via phone call with significant other Claris Che)      Prior Functioning/Environment Level of Independence: Independent with assistive device(s)        Comments: At baseline, pt uses a SPC to walk short household distances (bedroom to front door). Pt's significant other reports that he was independent with ADLs, however was having increased difficulty more recently and required increased time to complete. Pt's significant other reports that he was not eating 5 days prior to admission        OT Problem List: Decreased strength;Decreased activity tolerance;Impaired balance (sitting and/or standing);Decreased safety awareness;Decreased cognition      OT Treatment/Interventions: Self-care/ADL training;Therapeutic exercise;Energy  conservation;DME and/or AE instruction;Therapeutic activities;Patient/family education;Balance training    OT Goals(Current goals can be found in the care plan section) Acute Rehab OT Goals OT Goal Formulation: Patient unable to participate in goal setting Time For Goal Achievement: 06/09/20  OT Frequency: Min 1X/week   Barriers to D/C: Decreased caregiver support             AM-PAC OT "6 Clicks" Daily Activity     Outcome Measure Help from another person eating meals?: None Help from another person taking care of personal grooming?: A Little Help from another person toileting, which includes using toliet, bedpan, or urinal?: A Lot Help from another person bathing (including washing, rinsing, drying)?: A Lot Help from another person to put on and taking off regular upper body clothing?: A Lot Help from another person to put on and taking off regular lower body clothing?: A Little 6 Click Score: 16   End of Session Nurse Communication: Mobility status;Other (comment) (SpO2)  Activity Tolerance: Patient tolerated treatment well Patient left: in bed;with call bell/phone within reach;with bed alarm set  OT Visit Diagnosis: Muscle weakness (generalized) (M62.81)                Time: 0174-9449 OT Time Calculation (min): 18  min Charges:  OT General Charges $OT Visit: 1 Visit OT Evaluation $OT Eval Moderate Complexity: 1 Mod OT Treatments $Self Care/Home Management : 8-22 mins  Matthew Folks, OTR/L [facility] [phone]

## 2020-05-26 NOTE — Consult Note (Addendum)
Cardiology Consultation:   Patient ID: Curtis Walters MRN: [id number]; DOB: 1950-12-13  Admit date: 05/25/2020 Date of Consult: 05/26/2020  Primary Care Provider: Patient, No Pcp Per (Inactive) Primary Cardiologist: Dr Mariah Milling, Dr Elberta Fortis rounding Primary Electrophysiologist:  None   Patient Profile:   Curtis Walters is a 70 y.o. 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 Jomarie Longs.  History of Present Illness:   Curtis Walters is a 70 year old male PMH as above.  He has history of herniated disks due to an injury sustained while in [place]. 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   Prior to his 08/2019 [facility] admission, he did not have any previously known cardiac history and had been without a PCP for some time.   He was admitted to [facility] 8/13-8/17/2021 for new onset / acute HFrEF.  Echo as below under CV studies showed pulmonary hypertension and acute systolic heart failure with EF 20 to 25% and severely elevated PASP  72.1 mmHg.Marland Kitchen He was IV diuresed with symptom improvement and underwent [facility] 08/28/2019 with nonobstructive dz and mild heavily calcified pLAD 30%s.  RHC also showed mildly elevated filling pressures, mild pulmonary hypertension, and severely reduced CO.  Optimization of medical therapy was recommended.  Discharge weight 62 kg.  When seen by the [facility]F clinic 9/2, he had self discontinued all medications.    He was last seen in office 09/28/2019 with his fiance. He was volume up and only taking his Entresto. His predominant fluid intake was Pepsi.  He was not weighing himself.  Clinic weight 153 pounds. ReDS 39%.  He was started on  torsemide again.  Statin was deferred due to transaminitis 2/2 congestive hepatopathy but alcohol intake also noted.  Beta-blocker was deferred due to reported hallucinations with metoprolol.  A high burden of PVCs is noted as possibly contributing to his low EF.  He was lost to follow-up.  On 05/25/2020, he presented to [facility] with report of progressive shortness of breath over the last 2 days.  He reports ongoing chronic back pain, similar to that associated with his herniated disks and as in the past.  He denies any history of recent chest pain and does not recall telling anyone he had CP this admission. He reports he is sleeping fine. No orthopnea or PND.  No LEE or abdominal distention. He is still smoking.  He is still drinking Pepsi.   No tachypalpitations other than rare racing heart rate when exerting himself.  No signs or symptoms of bleeding.  No falls. He denies taking any of his cardiac medications leading up to admission.  During his consultation, he denies chest pain or shortness of breath and only reports thirst.  Initial labs significant for creatinine 2.03, BUN 41, AST 85, ALT 60, platelets 75, lactic acid 4.6, HS Tn 64, BNP >4500.  EKG showed sinus tachycardia, with no acute ST/T changes.  CTA without evidence of pulmonary embolism and showed 4 cm ascending aorta, stable cardiomegaly, stable right pleural thickening /effusion, CAC / Ao calcifications, hepatic steatosis /ascites in the upper abdomen, and emphysema.  CT abdomen suggestive of fatty infiltration or other diffuse hepatocellular disease.  Heart Pathway Score:  Past Medical History:  Diagnosis Date  . Alcohol use   . Chronic back pain   . Coronary artery disease, non-occlusive   . Dilated aortic root (HCC)   . HFrEF (heart failure with reduced ejection fraction) (HCC)   . Hypertension   . NICM (nonischemic cardiomyopathy) (HCC)   . Tobacco abuse   . Transaminitis   He denies any history of surgeries.  Past Surgical  History:  Procedure Laterality Date  . RIGHT AND LEFT HEART CATH N/A 08/28/2019   Procedure: RIGHT AND LEFT HEART CATH with possible percutaneous intervention;  Surgeon: Iran Ouch, MD;  Location: [facility] INVASIVE CV LAB;  Service: Cardiovascular;  Laterality: N/A;     Home Medications:  Prior to Admission medications   Not on File  He denies any PTA medications.  Inpatient Medications: Scheduled Meds: . aspirin EC  81 mg Oral Daily  . Chlorhexidine Gluconate Cloth  6 each Topical Daily  . thiamine injection  100 mg Intravenous Daily   Continuous Infusions:  PRN Meds: acetaminophen **OR** acetaminophen  Allergies:    Allergies  Allergen Reactions  . Penicillins Other (See Comments)    Did it involve swelling of the face/tongue/throat, SOB, or low BP? No Did it involve sudden or severe rash/hives, skin peeling, or any reaction on the inside of your mouth or nose? No Did you need to seek medical attention at a hospital or doctor's office? No When did it last happen? If all above answers are "NO", may proceed with cephalosporin use.    Social History:   Social History   Socioeconomic History  . Marital status: Single    Spouse name: Not on file  . Number of children: Not on file  . Years of education: Not on file  . Highest education level: Not on file  Occupational History  . Not on file  Tobacco Use  . Smoking status: Current Every Day Smoker    Types: Cigarettes  . Smokeless tobacco: Never Used  Substance and Sexual Activity  . Alcohol use: Yes    Alcohol/week: 35.0 standard drinks    Types: 14 Cans of beer, 21 Shots of liquor per week  . Drug use: Not Currently  . Sexual activity: Not Currently  Other Topics Concern  . Not on file  Social History Narrative  . Not on file   Social Determinants of Health   Financial Resource Strain: Not on file  Food Insecurity: Not on file  Transportation Needs: Not on file  Physical Activity: Not on file   Stress: Not on file  Social Connections: Not on file  Intimate Partner Violence: Not on file    Family History:   Family History  Problem Relation Age of Onset  . Sickle cell anemia Sister     He denies a family history of MI, heart failure, or arrhythmia.  ROS:  Please see the history of present illness.  Review of Systems  Constitutional: Negative for malaise/fatigue.  Respiratory: Positive for shortness of breath. Negative for cough, hemoptysis, sputum production and wheezing.   Cardiovascular: Negative for chest pain, palpitations, orthopnea, claudication, leg swelling and PND.  Gastrointestinal: Negative for blood in stool and melena.  Genitourinary: Negative for hematuria.  Musculoskeletal: Positive for back pain, joint pain and myalgias. Negative for falls.       History of herniated disks.  No recent falls.  Neurological: Negative for dizziness, loss of consciousness and weakness.  Psychiatric/Behavioral: The patient does not have insomnia.   All  other systems reviewed and are negative.   All other ROS reviewed and negative.     Physical Exam/Data:   Vitals:   05/26/20 0400 05/26/20 0500 05/26/20 0600 05/26/20 0700  BP: 110/88 (!) 133/96 (!) 114/92 103/77  Pulse: (!) 106 (!) 121 (!) 106 97  Resp: 14 19 (!) 0 13  Temp:   97.6 F (36.4 C)   TempSrc:   Oral   SpO2: 94% 99% 99% 94%  Weight:      Height:       No intake or output data in the 24 hours ending 05/26/20 0756 Last 3 Weights 05/26/2020 05/25/2020 04/20/2020  Weight (lbs) 134 lb 0.6 oz 140 lb 130 lb  Weight (kg) 60.8 kg 63.504 kg 58.968 kg     Body mass index is 17.68 kg/m.  General:  Thin male, NAD. HEENT: normal.  Nasal cannula oxygen in place. Neck: JVP difficult to assess due to nasal cannula but appears ~10-11cm Vascular:radial pulses 2+ bilaterally Cardiac:  normal S1, S2; tachycardic but regular; 2/6 systolic murmur RUSB and at the apex Lungs: Reduced bilateral breath sounds and worse at the bases  with R>L, scattered rhonchi Abd: No abdominal distention Ext: no bilateral edema. Musculoskeletal:  No deformities, BUE and BLE strength normal and equal Skin: warm and dry.   Neuro:  No focal abnormalities noted Psych:  Normal affect   EKG:  The EKG was personally reviewed and demonstrates:  Sinus tachycardia, 116 bpm left atrial enlargement, LAD, poor R wave progression in inferior leads II, 3, aVF as well as septal/anterior V1-V 4 and likely past anterior infarct, nonspecific TWI / repolarization changes   Telemetry:  Telemetry was personally reviewed and demonstrates:ST with rates 90 to 140 bpm (rates elevated to 140 bpm 5/15 at 5 AM)  Relevant CV Studies: [facility] 08/28/19  Prox LAD to Mid LAD lesion is 30% stenosed. 1.  Mild heavily calcified proximal LAD disease which does not seem to be obstructive.  No evidence of obstructive coronary artery disease overall. 2.  Severely reduced LV systolic function by echo.  Left ventricular angiography was not performed. 3.  Right heart catheterization showed mildly elevated filling pressures, mild pulmonary hypertension and severely reduced cardiac output.  RA: 6 mmHg RV: 39 over 2 mmHg Pulmonary capillary wedge pressure: 19 mmHg with normal waveforms. PA: 39/21 with a mean of 29 mmHg LVEDP: 17 mmHg. Cardiac output: 3.43 with a cardiac index of 1.86.  Recommendations: The patient has nonischemic cardiomyopathy. I switched furosemide to oral 40 mg once daily. Continue Toprol and Entresto.  Possible discharge home if he remains stable this afternoon.  Coronary Diagrams   Diagnostic Dominance: Right      Echo 08/25/19 1. Left ventricular ejection fraction, by estimation, is 20 to 25%. The  left ventricle has severely decreased function. The left ventricle has no  regional wall motion abnormalities. The left ventricular internal cavity  size was moderately dilated.  Indeterminate diastolic filling due to E-A fusion.  2. Right  ventricular systolic function is mildly reduced. The right  ventricular size is mildly enlarged. There is severely elevated pulmonary  artery systolic pressure. The estimated right ventricular systolic  pressure is 72.1 mmHg.  3. Left atrial size was moderately dilated.  4. Right atrial size was moderately dilated.  5. Moderate mitral valve regurgitation.  6. Tricuspid valve regurgitation is moderate.  7. Aortic valve regurgitation is mild to moderate. stenosis.  8. The inferior vena cava is dilated in size with <50% respiratory  variability, suggesting right atrial pressure of 15 mmHg.   Laboratory Data:  High Sensitivity Troponin:   Recent Labs  Lab 05/25/20 1147 05/25/20 1624 05/25/20 1754 05/25/20 2217  TROPONINIHS 64* 79* 80* 67*     Cardiac EnzymesNo results for input(s): TROPONINI in the last 168 hours. No results for input(s): TROPIPOC in the last 168 hours.  Chemistry Recent Labs  Lab 05/25/20 1146 05/26/20 0554  NA 138 139  K 3.7 4.0  CL 105 107  CO2 19* 20*  GLUCOSE 87 203*  BUN 41* 42*  CREATININE 2.03* 2.15*  CALCIUM 9.2 8.9  GFRNONAA 34* 32*  ANIONGAP 14 12    Recent Labs  Lab 05/25/20 1146 05/26/20 0554  PROT 6.9 6.2*  ALBUMIN 3.2* 3.0*  AST 85* 84*  ALT 60* 60*  ALKPHOS 78 74  BILITOT 4.1* 3.7*   Hematology Recent Labs  Lab 05/25/20 1146 05/26/20 0554  WBC 6.0 3.1*  RBC 4.29 4.09*  HGB 13.9 13.3  HCT 41.5 39.3  MCV 96.7 96.1  MCH 32.4 32.5  MCHC 33.5 33.8  RDW 18.7* 18.6*  PLT 75* 67*   BNP Recent Labs  Lab 05/25/20 2315  BNP >4,500.0*    DDimer No results for input(s): DDIMER in the last 168 hours.   Radiology/Studies:  DG Chest 2 View  Result Date: 05/25/2020 CLINICAL DATA:  Shortness of breath.  Diarrhea and weakness. EXAM: CHEST - 2 VIEW COMPARISON:  08/25/2019 FINDINGS: The heart is enlarged, stable in configuration. Small RIGHT pleural effusion or pleural thickening may be chronic. There is no pulmonary edema.  No consolidations. IMPRESSION: Stable cardiomegaly. Stable RIGHT pleural thickening or pleural effusion. Electronically Signed   By: Norva Pavlov M.D.   On: 05/25/2020 12:16   CT ANGIO CHEST PE W OR WO CONTRAST  Result Date: 05/25/2020 CLINICAL DATA:  Shortness of breath. Chest pain or back pain. Suspect aortic dissection. Shortness of breath, worse lying flat. Weakness and confusion. EXAM: CT ANGIOGRAPHY CHEST WITH CONTRAST TECHNIQUE: Multidetector CT imaging of the chest was performed using the standard protocol during bolus administration of intravenous contrast. Multiplanar CT image reconstructions and MIPs were obtained to evaluate the vascular anatomy. The technologist discussed the patient with Dr. Cyril Loosen prior to exam. Request was made for optimal evaluation of pulmonary arteries. CONTRAST:  70mL OMNIPAQUE IOHEXOL 350 MG/ML SOLN COMPARISON:  08/25/2019 FINDINGS: Cardiovascular: Stable cardiomegaly. No pericardial effusion. Coronary artery calcifications are present. There is atherosclerotic calcification of the thoracic aorta. Ascending aorta measures 4.0 centimeters. No displaced intimal calcifications indicate presence of dissection. The contrast bolus was timed for optimal opacification of the pulmonary arteries. There is no evidence for acute pulmonary embolism. Mediastinum/Nodes: The visualized portion of the thyroid gland has a normal appearance. The esophagus is normal in appearance. No significant mediastinal, hilar, or axillary adenopathy. Lungs/Pleura: Moderate RIGHT pleural effusion. Scattered panlobular emphysematous changes. No suspicious pulmonary nodules. No consolidations. Upper Abdomen: The liver is diffusely low attenuation, consistent with hepatic steatosis. Ascites identified in the UPPER abdomen around the spleen and liver. Musculoskeletal: No chest wall abnormality. No acute or significant osseous findings. Review of the MIP images confirms the above findings. IMPRESSION: 1. No  evidence for acute pulmonary embolus. 2. Study was performed with optimal evaluation of pulmonary arteries. However, there is no evidence for displaced intimal calcifications to indicate aortic dissection. 3. 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 Recommend annual imaging followup by CTA or MRA. This recommendation follows 2010 ACCF/AHA/AATS/ACR/ASA/SCA/SCAI/SIR/STS/SVM Guidelines for the Diagnosis and Management  of Patients with Thoracic Aortic Disease. Circulation. 2010; 121: F026-V785. Aortic aneurysm NOS (ICD10-I71.9) 4. Coronary artery calcifications. 5.  Aortic atherosclerosis.  (ICD10-I70.0) 6. Stable cardiomegaly. 7. Moderate RIGHT pleural effusion. 8. Hepatic steatosis. 9. Ascites in the UPPER abdomen. 10.  Emphysema (ICD10-J43.9). Electronically Signed   By: Norva Pavlov M.D.   On: 05/25/2020 14:49   US Abdomen Complete  Result Date: 05/25/2020 CLINICAL DATA:  Ascites, renal failure and hepatic steatosis EXAM: ABDOMEN ULTRASOUND COMPLETE COMPARISON:  Same day CT chest. FINDINGS: Gallbladder: No gallstones or wall thickening visualized. No sonographic Murphy sign noted by sonographer. Common bile duct: Diameter: 7 mm which is within normal limits for patient's age. Liver: No focal lesion identified. Coarsened echotexture with increased parenchymal echogenicity. Portal vein is patent on color Doppler imaging with normal direction of blood flow towards the liver. IVC: No abnormality visualized. Pancreas: Visualized portion unremarkable. Spleen: Size and appearance within normal limits. Right Kidney: Length: 9.9 cm. Echogenicity within normal limits. No mass or hydronephrosis visualized. Left Kidney: Length: 10.1 cm. Echogenicity within normal limits. 1.6 cm renal cyst. No solid mass or hydronephrosis visualized. Abdominal aorta: No aneurysm visualized. Other findings: Small volume ascites. IMPRESSION: Coarsened echotexture with increased parenchymal echogenicity suggesting fatty  infiltration or other diffuse hepatocellular disease. Small volume ascites. Electronically Signed   By: Maudry Mayhew MD   On: 05/25/2020 21:12    Assessment and Plan:   Acute on chronic HFrEF secondary to nonischemic cardiomyopathy (EF 20 to 25%), severe pulmonary hypertension, low output HF, known valvular disease --Reports SOB x2 days. BNP greater than 4500. History of EF 20 to 25%, PHTN, low CO as above. ED imaging shows R pleural effusion and no PE.  He has history of medical noncompliance and denies taking his cardiac medications.  R/LHC 08/2019 with the above pressures.  Suspect exacerbation of heart failure and current elevated volume status secondary to medication and treatment noncompliance. --Gentle diuresis with IV lasix as BP and Cr allow. Continue and monitor I's/O's, daily standing weights.  --Repeat echo to reassess EF, WM, and heart valves given known MR/TR/AS. --Daily BMET. Cr worsened overnight 2.03  2.15 with BUN 41  42, at least in part due to decompensated HF, as it does not appear he has received lasix since admitted. --Hold Entresto, spironolactone, and Farxiga given Cr/ renal function and low BP. Given decompensated HF, continue to hold BB for now. Previous hallucinations reported with metoprolol.  --Consider referral to advanced HF clinic if medication compliance can be achieved given valvular dz, PHTN, and severely reduced EF.  Tachycardia  History of PVCs -- Suspect elevated rates 2/2 severely reduced cardiac output. Previously reports hallucinations with metoprolol. Holding BB for now given decompensated HF. As above, could trial low dose Coreg if BP permits in the future.  Elevated high-sensitivity troponin History of nonobstructive CAD --No reported chest pain or symptoms suggestive of angina.  09/2019 cath showed mild nonobstructive CAD with pLAD 30%s. EKG without acute ST/T changes.  Troponin minimally elevated, flat trending, and not consistent with ACS.  Suspect  elevated troponin due to supply demand ischemia given volume overload, tachycardic rate, and CKD with AKI.  Donalee Gaumond attempt to add back optimize cardiac medications as above and as Cr/BP allows / low CO allows.  Statin held as in past due to transaminitis and liver findings above.  AOCKD --Cr bumped overnight. Caution with nephrotoxins as above. Daily BMET. Consider nephology consultation. Spoke with him about avoiding Pepsi. Glycemic control also advised. Caution with diuresis  as above and to avoid prerenal AKI in low output HF  Hyperglycemia --Per IM.   H/o thrombocytopenia -Daily CBC. Seen in the past.  Caution with antiplatelets, as has been recommended in the past.  H/o transaminitis Ascites, Fatty infiltration versus hepatocellular dz --Likely 2/2 volume overload and history of alcohol use with current liver findings above.  He does have a history of transaminitis in the past.  Per GI/IM.   HTN / Current hypotension -- Caution with medications that lower BP to avoid prerenal AKI.  As above, decompensated HF thus holding BB (and also hallucinations on metoprolol reported in past).  Current Tobacco use --Cessation advised.    Aortic atherosclerosis / dilation --Start of statin if LFTs allow.  Risk factor modification.   For questions or updates, please contact [facility] Please consult [email or web address] for contact info under     Signed, Lennon AlstromJacquelyn D Visser, PA-C  05/26/2020 7:56 AM    I have seen and examined this patient with Stephannie LiJacqyelyn Visser.  Agree with above, note added to reflect my findings.  On exam, RRR, no murmurs, lungs clear,, diminished right-sided breath sounds.  Patient presented to the hospital with shortness of breath.  His BNP was found to be elevated at greater than 4500.  His troponins were mildly elevated as well, likely due to demand ischemia from heart failure.  He is in acute on chronic renal failure.  At this point, would continue with diuresis.  He is able  to lay flat and is not on oxygen, though a further day of diuresis may be beneficial to see if his kidney function improves.  Of note, he is quite confused.  He is alert to self, but not to place, year, or situation.  He does not know who the president is.  If he becomes clear, he Caterra Ostroff need quite a bit of education on heart failure, medication compliance, and dietary compliance.  Braddock Servellon M. Tenicia Gural MD 05/26/2020 12:08 PM

## 2020-05-27 ENCOUNTER — Inpatient Hospital Stay (HOSPITAL_COMMUNITY)
Admit: 2020-05-27 | Discharge: 2020-05-27 | Disposition: A | Discharge status: Home / Self Care | Payer: Medicare Other | Attending: Physician Assistant | Admitting: Physician Assistant

## 2020-05-27 ENCOUNTER — Inpatient Hospital Stay: Payer: Medicare Other

## 2020-05-27 DIAGNOSIS — I5021 Acute systolic (congestive) heart failure: Secondary | ICD-10-CM

## 2020-05-27 LAB — BLOOD CULTURE ID PANEL (REFLEXED) - BCID2

## 2020-05-27 LAB — ECHOCARDIOGRAM COMPLETE
AR max vel: 2.09 cm2
AV Area VTI: 2.37 cm2
AV Area mean vel: 1.79 cm2
AV Mean grad: 1 mmHg
AV Peak grad: 2.2 mmHg
Ao pk vel: 0.75 m/s
Area-P 1/2: 4.44 cm2
Calc EF: 24.8 %
Height: 73 in
P 1/2 time: 466 msec
S' Lateral: 5.82 cm
Single Plane A2C EF: 17 %
Single Plane A4C EF: 28.7 %
Weight: 2192 oz

## 2020-05-27 LAB — BASIC METABOLIC PANEL
Anion gap: 12 (ref 5–15)
BUN: 45 mg/dL — ABNORMAL HIGH (ref 8–23)
CO2: 19 mmol/L — ABNORMAL LOW (ref 22–32)
Calcium: 8.7 mg/dL — ABNORMAL LOW (ref 8.9–10.3)
Chloride: 105 mmol/L (ref 98–111)
Creatinine, Ser: 2.69 mg/dL — ABNORMAL HIGH (ref 0.61–1.24)
GFR, Estimated: 25 mL/min — ABNORMAL LOW (ref >60)
Glucose, Bld: 133 mg/dL — ABNORMAL HIGH (ref 70–99)
Potassium: 4.6 mmol/L (ref 3.5–5.1)
Sodium: 136 mmol/L (ref 135–145)

## 2020-05-27 LAB — APTT: aPTT: 31 seconds (ref 24–36)

## 2020-05-27 LAB — CBC
HCT: 39.3 % (ref 39.0–52.0)
Hemoglobin: 13.4 g/dL (ref 13.0–17.0)
MCH: 33.2 pg (ref 26.0–34.0)
MCHC: 34.1 g/dL (ref 30.0–36.0)
MCV: 97.3 fL (ref 80.0–100.0)
Platelets: 59 10*3/uL — ABNORMAL LOW (ref 150–400)
RBC: 4.04 MIL/uL — ABNORMAL LOW (ref 4.22–5.81)
RDW: 18.4 % — ABNORMAL HIGH (ref 11.5–15.5)
WBC: 8.3 10*3/uL (ref 4.0–10.5)
nRBC: 1.1 % — ABNORMAL HIGH (ref 0.0–0.2)

## 2020-05-27 LAB — GLUCOSE, CAPILLARY
Glucose-Capillary: 99 mg/dL (ref 70–99)
Glucose-Capillary: 93 mg/dL (ref 70–99)
Glucose-Capillary: 110 mg/dL — ABNORMAL HIGH (ref 70–99)

## 2020-05-27 MED ORDER — LORAZEPAM 2 MG/ML IJ SOLN
1.0000 mg | INTRAMUSCULAR | Status: DC | PRN
  Administered 2020-05-27 – 2020-05-29 (×9): 2 mg via INTRAVENOUS
  Filled 2020-05-27 (×9): qty 1

## 2020-05-27 MED ORDER — THIAMINE HCL 100 MG/ML IJ SOLN: 100.0000 mg | Freq: Every day | INTRAMUSCULAR | Status: DC

## 2020-05-27 MED ORDER — FOLIC ACID 1 MG PO TABS
1.0000 mg | ORAL_TABLET | Freq: Every day | ORAL | Status: DC
  Administered 2020-05-28: 1 mg via ORAL
  Filled 2020-05-27: qty 1

## 2020-05-27 MED ORDER — POTASSIUM CHLORIDE CRYS ER 20 MEQ PO TBCR: 20.0000 meq | EXTENDED_RELEASE_TABLET | Freq: Every day | ORAL | Status: DC

## 2020-05-27 MED ORDER — HEPARIN BOLUS VIA INFUSION
4000.0000 [IU] | Freq: Once | INTRAVENOUS | Status: AC
  Administered 2020-05-27: 4000 [IU] via INTRAVENOUS
  Filled 2020-05-27: qty 4000

## 2020-05-27 MED ORDER — ADULT MULTIVITAMIN W/MINERALS CH
1.0000 | ORAL_TABLET | Freq: Every day | ORAL | Status: DC
  Administered 2020-05-28: 1 via ORAL
  Filled 2020-05-27: qty 1

## 2020-05-27 MED ORDER — ALPRAZOLAM 0.25 MG PO TABS
0.2500 mg | ORAL_TABLET | Freq: Two times a day (BID) | ORAL | Status: DC | PRN
Start: 2020-05-27 — End: 2020-05-30
  Administered 2020-05-27: 0.25 mg via ORAL
  Filled 2020-05-27: qty 1

## 2020-05-27 MED ORDER — MILRINONE LACTATE IN DEXTROSE 20-5 MG/100ML-% IV SOLN
0.1250 ug/kg/min | INTRAVENOUS | Status: DC
  Administered 2020-05-27: 0.125 ug/kg/min via INTRAVENOUS
  Filled 2020-05-27 (×2): qty 100

## 2020-05-27 MED ORDER — THIAMINE HCL 100 MG PO TABS: 100.0000 mg | ORAL_TABLET | Freq: Every day | ORAL | Status: DC

## 2020-05-27 MED ORDER — HEPARIN (PORCINE) 25000 UT/250ML-% IV SOLN
1000.0000 [IU]/h | INTRAVENOUS | Status: DC
  Administered 2020-05-27 – 2020-05-28 (×2): 1000 [IU]/h via INTRAVENOUS
  Filled 2020-05-27 (×2): qty 250

## 2020-05-27 MED ORDER — LORAZEPAM 1 MG PO TABS: 1.0000 mg | ORAL_TABLET | ORAL | Status: DC | PRN

## 2020-05-27 NOTE — Consult Note (Signed)
ANTICOAGULATION CONSULT NOTE - Consult  Pharmacy Consult for Heparin gtt Indication: LV Apical Thrombus  Allergies  Allergen Reactions  . Penicillins Other (See Comments)    Did it involve swelling of the face/tongue/throat, SOB, or low BP? No Did it involve sudden or severe rash/hives, skin peeling, or any reaction on the inside of your mouth or nose? No Did you need to seek medical attention at a hospital or doctor's office? No When did it last happen? If all above answers are "NO", may proceed with cephalosporin use.    Patient Measurements: Height: 6\' 1"  (185.4 cm) Weight: 62.1 kg (137 lb) IBW/kg (Calculated) : 79.9 Heparin Dosing Weight: 62.1 kg  Vital Signs: Temp: 97.6 F (36.4 C) (05/16 1123) Temp Source: Oral (05/16 1123) BP: 100/80 (05/16 1123) Pulse Rate: 99 (05/16 1123)  Labs: Recent Labs    05/25/20 1146 05/25/20 1147 05/25/20 1624 05/25/20 1754 05/25/20 2217 05/25/20 2315 05/26/20 0554 05/27/20 0412  HGB 13.9  --   --   --   --   --  13.3 13.4  HCT 41.5  --   --   --   --   --  39.3 39.3  PLT 75*  --   --   --   --   --  67* 59*  LABPROT  --   --   --   --   --  19.4*  --   --   INR  --   --   --   --   --  1.6*  --   --   CREATININE 2.03*  --   --   --   --   --  2.15* 2.69*  CKTOTAL  --   --   --   --   --   --  96  --   TROPONINIHS  --    < > 79* 80* 67*  --   --   --    < > = values in this interval not displayed.    Estimated Creatinine Clearance: 22.1 mL/min (A) (by C-G formula based on SCr of 2.69 mg/dL (H)).   Medications:  No AC PTA Inpatient only ASA 81mg  QD prior to hep gtt start.  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 Pt developing worsening of renal fxn thought to be 2/2 to cardiorenal s/o and dec'd perfusion.   On 5/16 pt  demonstrated Cheyne-Stokes breathing and became dyspneic and dizzy with attempted time up to chair with PT. On 5/16 w/u Pt's EF noted 20-25% on ECHO and LV Apical Thrombus appreciated. Milrinone CIVI started and transfer to CCU. Pharmacy consulted for the mgmt of heparin gtt.    Baseline INR 1.6; Hgb 13.9>13.4; Plts 75>67>59 (prior to hep start) APTT - ordered/pending     Goal of Therapy:  Heparin level 0.3-0.7 units/ml Monitor platelets by anticoagulation protocol: Yes   Plan:  Give 4000 units bolus x 1 Start heparin infusion at 1000 units/hr Check anti-Xa level in 8 hours and daily while on heparin Continue to monitor H&H and platelets  6/16 Zamar Odwyer 05/27/2020,3:16 PM

## 2020-05-27 NOTE — Progress Notes (Signed)
During assessment, patient states there is soreness on bottom. Assessed and noticed two very small open areas to the left and right Inner buttocks. Cleansed, patted it dry, and applied two small foam gauze to protect and help the small open area to heal. Due to the small size of the open area though it appears to be a stage 2. Reported to oncoming Nurse and documented in the chart.

## 2020-05-27 NOTE — Progress Notes (Signed)
Progress Note  Patient Name: Curtis Walters Date of Encounter: 05/27/2020  Primary Cardiologist: Julien Nordmann, MD  Subjective   No complaints @ rest, however, he becomes short of breath just sitting up in bed.  No chest pain.  Inpatient Medications    Scheduled Meds: . aspirin EC  81 mg Oral Daily  . Chlorhexidine Gluconate Cloth  6 each Topical Daily  . furosemide  40 mg Intravenous BID AC  . [START ON 05/28/2020] potassium chloride  20 mEq Oral Daily  . thiamine injection  100 mg Intravenous Daily   Continuous Infusions:  PRN Meds: acetaminophen **OR** acetaminophen   Vital Signs    Vitals:   05/26/20 2040 05/26/20 2150 05/27/20 0450 05/27/20 0729  BP: 100/81 115/86  104/83  Pulse: 100 92  (!) 102  Resp: 18   18  Temp: 98.1 F (36.7 C)   (!) 97.5 F (36.4 C)  TempSrc: Oral     SpO2: 100% 90% 97% 100%  Weight: 62.1 kg  62.1 kg   Height: 6\' 1"  (1.854 m)       Intake/Output Summary (Last 24 hours) at 05/27/2020 1041 Last data filed at 05/26/2020 1817 Gross per 24 hour  Intake 480 ml  Output 275 ml  Net 205 ml   Filed Weights   05/26/20 0015 05/26/20 2040 05/27/20 0450  Weight: 60.8 kg 62.1 kg 62.1 kg    Physical Exam   GEN: Thin, frail in no acute distress.  HEENT: Grossly normal.  Neck: Supple, no JVD, carotid bruits, or masses. Cardiac: RRR, tachycardic, 2/6 syst murmur throughout, no rubs, or gallops. No clubbing, cyanosis, edema.  Radials 2+, DP/PT 2+ and equal bilaterally.  Respiratory:  Respirations regular and unlabored, markedly diminished ~ 1/3 way up on R.  Diminished w/ scattered rhonchi @ L base.   GI: Soft, nontender, nondistended, BS + x 4. MS: no deformity or atrophy. Skin: warm and dry, no rash. Neuro:  Strength and sensation are intact. Psych: AAOx3.  Normal affect.  Labs    Chemistry Recent Labs  Lab 05/25/20 1146 05/26/20 0554 05/27/20 0412  NA 138 139 136  K 3.7 4.0 4.6  CL 105 107 105  CO2 19* 20* 19*  GLUCOSE 87 203*  133*  BUN 41* 42* 45*  CREATININE 2.03* 2.15* 2.69*  CALCIUM 9.2 8.9 8.7*  PROT 6.9 6.2*  --   ALBUMIN 3.2* 3.0*  --   AST 85* 84*  --   ALT 60* 60*  --   ALKPHOS 78 74  --   BILITOT 4.1* 3.7*  --   GFRNONAA 34* 32* 25*  ANIONGAP 14 12 12      Hematology Recent Labs  Lab 05/25/20 1146 05/26/20 0554 05/27/20 0412  WBC 6.0 3.1* 8.3  RBC 4.29 4.09* 4.04*  HGB 13.9 13.3 13.4  HCT 41.5 39.3 39.3  MCV 96.7 96.1 97.3  MCH 32.4 32.5 33.2  MCHC 33.5 33.8 34.1  RDW 18.7* 18.6* 18.4*  PLT 75* 67* 59*    Cardiac Enzymes  Recent Labs  Lab 05/25/20 1147 05/25/20 1624 05/25/20 1754 05/25/20 2217  TROPONINIHS 64* 79* 80* 67*      BNP Recent Labs  Lab 05/25/20 2315  BNP >4,500.0*    Lipids  Lab Results  Component Value Date   CHOL 192 08/26/2019   HDL 72 08/26/2019   LDLCALC 104 (H) 08/26/2019   TRIG 82 08/26/2019   CHOLHDL 2.7 08/26/2019    HbA1c  Lab Results  Component Value  Date   HGBA1C 5.3 05/26/2020    Radiology    DG Chest 2 View  Result Date: 05/25/2020 CLINICAL DATA:  Shortness of breath.  Diarrhea and weakness. EXAM: CHEST - 2 VIEW COMPARISON:  08/25/2019 FINDINGS: The heart is enlarged, stable in configuration. Small RIGHT pleural effusion or pleural thickening may be chronic. There is no pulmonary edema. No consolidations. IMPRESSION: Stable cardiomegaly. Stable RIGHT pleural thickening or pleural effusion. Electronically Signed   By: Norva Pavlov M.D.   On: 05/25/2020 12:16   CT ANGIO CHEST PE W OR WO CONTRAST  Result Date: 05/25/2020 CLINICAL DATA:  Shortness of breath. Chest pain or back pain. Suspect aortic dissection. Shortness of breath, worse lying flat. Weakness and confusion. EXAM: CT ANGIOGRAPHY CHEST WITH CONTRAST TECHNIQUE: Multidetector CT imaging of the chest was performed using the standard protocol during bolus administration of intravenous contrast. Multiplanar CT image reconstructions and MIPs were obtained to evaluate the  vascular anatomy. The technologist discussed the patient with Dr. Cyril Loosen prior to exam. Request was made for optimal evaluation of pulmonary arteries. CONTRAST:  96mL OMNIPAQUE IOHEXOL 350 MG/ML SOLN COMPARISON:  08/25/2019 FINDINGS: Cardiovascular: Stable cardiomegaly. No pericardial effusion. Coronary artery calcifications are present. There is atherosclerotic calcification of the thoracic aorta. Ascending aorta measures 4.0 centimeters. No displaced intimal calcifications indicate presence of dissection. The contrast bolus was timed for optimal opacification of the pulmonary arteries. There is no evidence for acute pulmonary embolism. Mediastinum/Nodes: The visualized portion of the thyroid gland has a normal appearance. The esophagus is normal in appearance. No significant mediastinal, hilar, or axillary adenopathy. Lungs/Pleura: Moderate RIGHT pleural effusion. Scattered panlobular emphysematous changes. No suspicious pulmonary nodules. No consolidations. Upper Abdomen: The liver is diffusely low attenuation, consistent with hepatic steatosis. Ascites identified in the UPPER abdomen around the spleen and liver. Musculoskeletal: No chest wall abnormality. No acute or significant osseous findings. Review of the MIP images confirms the above findings. IMPRESSION: 1. No evidence for acute pulmonary embolus. 2. Study was performed with optimal evaluation of pulmonary arteries. However, there is no evidence for displaced intimal calcifications to indicate aortic dissection. 3. 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 Recommend annual imaging followup by CTA or MRA. This recommendation follows 2010 ACCF/AHA/AATS/ACR/ASA/SCA/SCAI/SIR/STS/SVM Guidelines for the Diagnosis and Management of Patients with Thoracic Aortic Disease. Circulation. 2010; 121: J335-K562. Aortic aneurysm NOS (ICD10-I71.9) 4. Coronary artery calcifications. 5.  Aortic atherosclerosis.  (ICD10-I70.0) 6. Stable cardiomegaly. 7.  Moderate RIGHT pleural effusion. 8. Hepatic steatosis. 9. Ascites in the UPPER abdomen. 10.  Emphysema (ICD10-J43.9). Electronically Signed   By: Norva Pavlov M.D.   On: 05/25/2020 14:49   US Abdomen Complete  Result Date: 05/25/2020 CLINICAL DATA:  Ascites, renal failure and hepatic steatosis EXAM: ABDOMEN ULTRASOUND COMPLETE COMPARISON:  Same day CT chest. FINDINGS: Gallbladder: No gallstones or wall thickening visualized. No sonographic Murphy sign noted by sonographer. Common bile duct: Diameter: 7 mm which is within normal limits for patient's age. Liver: No focal lesion identified. Coarsened echotexture with increased parenchymal echogenicity. Portal vein is patent on color Doppler imaging with normal direction of blood flow towards the liver. IVC: No abnormality visualized. Pancreas: Visualized portion unremarkable. Spleen: Size and appearance within normal limits. Right Kidney: Length: 9.9 cm. Echogenicity within normal limits. No mass or hydronephrosis visualized. Left Kidney: Length: 10.1 cm. Echogenicity within normal limits. 1.6 cm renal cyst. No solid mass or hydronephrosis visualized. Abdominal aorta: No aneurysm visualized. Other findings: Small volume ascites. IMPRESSION: Coarsened echotexture with increased parenchymal echogenicity suggesting  fatty infiltration or other diffuse hepatocellular disease. Small volume ascites. Electronically Signed   By: Maudry Mayhew MD   On: 05/25/2020 21:12    Telemetry    RSR - sinus tachycardia. 4 beats NSVT - Personally Reviewed  Cardiac Studies   2D Echocardiogram 8.2021   1. Left ventricular ejection fraction, by estimation, is 20 to 25%. The  left ventricle has severely decreased function. The left ventricle has no  regional wall motion abnormalities. The left ventricular internal cavity  size was moderately dilated.  Indeterminate diastolic filling due to E-A fusion.   2. Right ventricular systolic function is mildly reduced. The right   ventricular size is mildly enlarged. There is severely elevated pulmonary  artery systolic pressure. The estimated right ventricular systolic  pressure is 72.1 mmHg.   3. Left atrial size was moderately dilated.   4. Right atrial size was moderately dilated.   5. Moderate mitral valve regurgitation.   6. Tricuspid valve regurgitation is moderate.   7. Aortic valve regurgitation is mild to moderate. stenosis.   8. The inferior vena cava is dilated in size with <50% respiratory  variability, suggesting right atrial pressure of 15 mmHg.  _____________   Cardiac Catheterization 8.2021  1.  Mild heavily calcified proximal LAD disease which does not seem to be obstructive.  No evidence of obstructive coronary artery disease overall. 2.  Severely reduced LV systolic function by echo.  Left ventricular angiography was not performed. 3.  Right heart catheterization showed mildly elevated filling pressures, mild pulmonary hypertension and severely reduced cardiac output.   RA: 6 mmHg RV: 39 over 2 mmHg Pulmonary capillary wedge pressure: 19 mmHg with normal waveforms. PA: 39/21 with a mean of 29 mmHg LVEDP: 17 mmHg. Cardiac output: 3.43 with a cardiac index of 1.86. _____________   2D Echocardiogram 5.16.2022  Pending _____________    Patient Profile     70 y.o. 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  Assessment & Plan    1.  Acute on chronic HFrEF/NICM:  Admitted 5/14 in the setting of progressive dyspnea ad medication noncompliance w/ finding of CHF.  EF 20-25% by echo 08/2019 w/ nonobs dzs @ that time.  He notes only minimal improvement in dyspnea.  Limited UO reported, thus ? Accuracy of  I/Os. BUN/Creat rising w/ IV diuresis.  Bicarb low @ 19.  Echo pending.  Hold lasix.  Consider thoracentesis for R pleural effusion.  Unable to initiate  blocker, acei/arb/arni/mra in setting of soft BPs.  2.  Acute on chronic stage III kidney dzs:  BUN/Creat cont to rise in setting of contrast on admission w/ IV diuresis since. Will hold lasix this AM.  Follow.  Avoid nephrotoxic agents.    3.  Demand Ischemia:  HsTrop mildly elevated w/ peak of 80 in the setting of above.  No c/p.  Prior h/o nonobs CAD by cath in 08/2019. This does not appear to represent ACS. Cont med rx  asa.  Not on statin 2/2 elevated LFTs.  4.  Tob abuse:  Cessation advised.  5.  Ao dilation:  4cm TAA noted on CTA.  Will need f/u imaging in a year - prob echo given CKD.  6. Essential HTN:  On  blocker @ home -  held in setting of soft BPs.  7.  ETOH abuse:  Cessation advised.  H/o transaminitis w/ ascites.  8.  PAH:  RVSP 72.1 on echo this admission.  No PE on CTA.  Will likely benefit from referral to advanced CHF/PAH clinic in [place] - though compliance likely to remain an issue.  Signed, Nicolasa Ducking, NP  05/27/2020, 10:41 AM    For questions or updates, please contact   Please consult [email or web address] for contact info under Cardiology/STEMI.

## 2020-05-27 NOTE — Progress Notes (Signed)
Report to ICU RN, Myra/patient and girlfriend made aware of transport. NT to transport pt to  ICU room 09.

## 2020-05-27 NOTE — Progress Notes (Addendum)
1600 Patient received from 2A via bed. Patient had difficulty moving to ICU bed. States he is tired. B/P low. Oxygen saturations up and down. Patient very confused and oriented to self only. Alert but doses off frequently. No

## 2020-05-27 NOTE — Progress Notes (Signed)
*  PRELIMINARY RESULTS* Echocardiogram 2D Echocardiogram has been performed.  Curtis Walters 05/27/2020, 9:55 AM

## 2020-05-27 NOTE — Consult Note (Signed)
WOC Nurse wound consult note Consultation was completed by review of records, images and assistance from the bedside nurse/clinical staff.   Reason for Consult: pressure injury Wound type: Stage 2 pressure injury Pressure Injury POA: Yes Measurement: so small; documented too small to measure Wound bed: 100% clean; pink Drainage (amount, consistency, odor) none Periwound:intact Dressing procedure/placement/frequency Continue silicone foam to absorb moisture, protect and insulate.  Implemented appropriately by the staff per the skin care order set  Re consult if needed, will not follow at this time. Thanks  Dardan Shelton [employer], RN,CWOCN, CNS, CWON-AP [phone])

## 2020-05-27 NOTE — Progress Notes (Addendum)
Remains confused. Patient has diagnosed sleep apnea but refuses to wear a c-pap at home. So periods of apnea noted with drops in respiratory rate and oxygen saturation.Heparin and Milrinone started as well as Heparin bolus.Navel reddened and raw.

## 2020-05-27 NOTE — Progress Notes (Signed)
Occupational Therapy Treatment Patient Details Name: Curtis Walters MRN: [id number] DOB: March 08, 1950 Today's Date: 05/27/2020    History of present illness Patient is a 70 year old male with history of nonischemic cardiomyopathy, EF of 20%, mild pulmonary hypertension, moderate MR/TR, mild to moderate AAS, alcohol abuse, alcoholic liver disease, tobacco abuse, chronic back pain presented to the ED with dyspnea.   OT comments  Pt seen for OT treatment on this date. Upon arrival to room, pt awake and seated upright at EOB. Pt denying any dizziness, however complaining that he felt SOB; SpO2 95% (on RA) and HR 105. Pt agreeable to sitting upright in recliner for improved breathing posture. RN aware of pt's symptoms, okaying OOB mobility, and providing anxiety medication prior to mobility. Pt able to perform sit>stand transfer with MIN A and functional mobility of short household distances (~6 ft) with RW and MIN A. Once seated in chair with LEs elevated, pt endorsing dizziness (HR 115, SpO2 95%, and BP 89/75). Following x20 ankle pumps, BP retaken (BP 92/73) and pt continuing to compained of dizziness. Pt returned bed (MIN A for stand pivot transfer) with BP 95/75. RN and cardiology provider informed. Pt left supine in bed with cardiology provider at bedside. Per cardiology, pt to be transferred to higher level of care. Per therapy protocols, pt will require new orders to provide additional therapy services. OT to sign off at this time. Please re-consult as pt is medically appropriate.     Follow Up Recommendations  SNF    Equipment Recommendations  Other (comment) (defer to next venue of care)       Precautions / Restrictions Precautions Precautions: Fall Restrictions Weight Bearing Restrictions: No       Mobility Bed Mobility Overal bed mobility: Needs Assistance Bed Mobility: Sit to Supine     Supine to sit: Supervision;HOB elevated Sit to supine: Supervision;HOB elevated   General bed  mobility comments: verbal cues for technique and task initiation    Transfers Overall transfer level: Needs assistance Equipment used: Rolling walker (2 wheeled) Transfers: Sit to/from Stand Sit to Stand: Min assist        Lateral/Scoot Transfers: Supervision General transfer comment: supervision for scooting to right side in bed x 2 bouts. no physical assistance required. patient refused to get out of bed, stating " I just don't feel like it"    Balance Overall balance assessment: Needs assistance Sitting-balance support: No upper extremity supported;Feet supported Sitting balance-Leahy Scale: Good Sitting balance - Comments: Good static sitting balance at EOB   Standing balance support: Bilateral upper extremity supported;During functional activity Standing balance-Leahy Scale: Poor Standing balance comment: Requires MIN A to walk ~76ft with RW                           ADL either performed or assessed with clinical judgement                  Cognition Arousal/Alertness: Awake/alert Behavior During Therapy: WFL for tasks assessed/performed Overall Cognitive Status: No family/caregiver present to determine baseline cognitive functioning                                 General Comments: Pt A&Ox1. Pt reporting that he feels "different than usual", however unable to recall situation following re-orientation. Pt motivated to participate in therapy, however continously stating that he doesnt like receiving help from others  Pertinent Vitals/ Pain       Pain Assessment: No/denies pain  Home Living Family/patient expects to be discharged to:: Private residence Living Arrangements: Other (Comment) (reports he has a "male" that lives there with him. per chart review, patient lives alone) Available Help at Discharge: Family;Available PRN/intermittently Type of Home: Apartment Home Access: Level entry     Home Layout: One  level     Bathroom Shower/Tub: Producer, television/film/video: Handicapped height     Home Equipment: Cane - single point;Grab bars - tub/shower   Additional Comments: prior level of function gathered by patient and chart review      Prior Functioning/Environment Level of Independence: Independent with assistive device(s)        Comments: intermittent cane use for ambulation "when needed". per chart, patient ambulates limited distance recently   Frequency  Min 1X/week        Progress Toward Goals  OT Goals(current goals can now be found in the care plan section)     Acute Rehab OT Goals Patient Stated Goal: to get some rest OT Goal Formulation: With patient Time For Goal Achievement: 06/09/20  Plan Discharge plan needs to be updated;Frequency remains appropriate       AM-PAC OT "6 Clicks" Daily Activity     Outcome Measure   Help from another person eating meals?: None Help from another person taking care of personal grooming?: A Little Help from another person toileting, which includes using toliet, bedpan, or urinal?: A Lot Help from another person bathing (including washing, rinsing, drying)?: A Lot Help from another person to put on and taking off regular upper body clothing?: A Little Help from another person to put on and taking off regular lower body clothing?: A Little 6 Click Score: 17    End of Session Equipment Utilized During Treatment: Gait belt;Rolling walker  OT Visit Diagnosis: Muscle weakness (generalized) (M62.81)   Activity Tolerance Patient limited by fatigue   Patient Left in bed;with call bell/phone within reach;with bed alarm set   Nurse Communication Mobility status;Other (comment) (BP)        Time: 4332-9518 OT Time Calculation (min): 38 min  Charges: OT General Charges $OT Visit: 1 Visit OT Treatments $Therapeutic Activity: 38-52 mins  Matthew Folks, OTR/L [facility] [phone]

## 2020-05-27 NOTE — Progress Notes (Signed)
1830 Informed Girtha Rm( significant other) of transfer to ICU 09. Informed that only son JC and daughter Elmarie Shiley can receive information about patient.

## 2020-05-27 NOTE — Progress Notes (Signed)
PHARMACY - PHYSICIAN COMMUNICATION CRITICAL VALUE ALERT - BLOOD CULTURE IDENTIFICATION (BCID)  Curtis Walters is an 70 y.o. male who presented to [facility] on 05/25/2020 with a chief complaint of SOB   Assessment:  Gram positive cocci in chains in 1 of 2 bottles, BCID could not ID the organism (include suspected source if known). Pt is afebrile and WBC's are not elevated so does not appear sick.  Most likely a contaminant.     Name of physician (or Provider) Contacted: Doylene Canard   Current antibiotics: none   Changes to prescribed antibiotics recommended:  Recommendations accepted by provider .  Will not treat for now b/c is probably contaminant.   Results for orders placed or performed during the hospital encounter of 05/25/20  Blood Culture ID Panel (Reflexed) (Collected: 05/25/2020  3:16 PM)  Result Value Ref Range   Enterococcus faecalis NOT DETECTED NOT DETECTED   Enterococcus Faecium NOT DETECTED NOT DETECTED   Listeria monocytogenes NOT DETECTED NOT DETECTED   Staphylococcus species NOT DETECTED NOT DETECTED   Staphylococcus aureus (BCID) NOT DETECTED NOT DETECTED   Staphylococcus epidermidis NOT DETECTED NOT DETECTED   Staphylococcus lugdunensis NOT DETECTED NOT DETECTED   Streptococcus species NOT DETECTED NOT DETECTED   Streptococcus agalactiae NOT DETECTED NOT DETECTED   Streptococcus pneumoniae NOT DETECTED NOT DETECTED   Streptococcus pyogenes NOT DETECTED NOT DETECTED   A.calcoaceticus-baumannii NOT DETECTED NOT DETECTED   Bacteroides fragilis NOT DETECTED NOT DETECTED   Enterobacterales NOT DETECTED NOT DETECTED   Enterobacter cloacae complex NOT DETECTED NOT DETECTED   Escherichia coli NOT DETECTED NOT DETECTED   Klebsiella aerogenes NOT DETECTED NOT DETECTED   Klebsiella oxytoca NOT DETECTED NOT DETECTED   Klebsiella pneumoniae NOT DETECTED NOT DETECTED   Proteus species NOT DETECTED NOT DETECTED   Salmonella species NOT DETECTED NOT DETECTED   Serratia marcescens  NOT DETECTED NOT DETECTED   Haemophilus influenzae NOT DETECTED NOT DETECTED   Neisseria meningitidis NOT DETECTED NOT DETECTED   Pseudomonas aeruginosa NOT DETECTED NOT DETECTED   Stenotrophomonas maltophilia NOT DETECTED NOT DETECTED   Candida albicans NOT DETECTED NOT DETECTED   Candida auris NOT DETECTED NOT DETECTED   Candida glabrata NOT DETECTED NOT DETECTED   Candida krusei NOT DETECTED NOT DETECTED   Candida parapsilosis NOT DETECTED NOT DETECTED   Candida tropicalis NOT DETECTED NOT DETECTED   Cryptococcus neoformans/gattii NOT DETECTED NOT DETECTED    Joyann Spidle D 05/27/2020  4:03 AM

## 2020-05-27 NOTE — Progress Notes (Signed)
Ppt c/o SOB, and feeling anxious/O2 sats 99% on 3L [place]/no acute distress noted/no use of accessory muscles. MD made aware/new orders foe CXR and 1 time dose of xanax (see MAR). Will administer med and continue to monitor.

## 2020-05-27 NOTE — Evaluation (Addendum)
Physical Therapy Evaluation Patient Details Name: Curtis Walters MRN: [id number] DOB: Jul 08, 1950 Today's Date: 05/27/2020   History of Present Illness  Patient is a 70 year old male with history of nonischemic cardiomyopathy, EF of 20%, mild pulmonary hypertension, moderate MR/TR, mild to moderate AAS, alcohol abuse, alcoholic liver disease, tobacco abuse, chronic back pain presented to the ED with dyspnea.    Clinical Impression  Patient reports he is previously independent with ambulating using single point cane intermittently. Patient needs maximal encouragement to participate with therapy and mobility efforts. No physical assistance required for bed mobility with good sitting balance demonstrated. Sp02 96%. Patient fatigued with activity and refused to stand or progress mobility further at this time. No significant shortness of breath noted in sitting position. Patient does appear to have generalized weakness and is not motivated to participate with out of bed activity at this time. Consider SNF placement where patient can receive physical therapy to maximize independence and reduce risk for falls. PT will continue to follow as patient willing to participate.     Follow Up Recommendations SNF    Equipment Recommendations  Rolling walker with 5" wheels    Recommendations for Other Services       Precautions / Restrictions Precautions Precautions: Fall Restrictions Weight Bearing Restrictions: No      Mobility  Bed Mobility Overal bed mobility: Needs Assistance Bed Mobility: Supine to Sit;Sit to Supine     Supine to sit: Supervision;HOB elevated Sit to supine: Supervision;HOB elevated   General bed mobility comments: verbal cues for technique and task initiation    Transfers Overall transfer level: Needs assistance              Lateral/Scoot Transfers: Supervision General transfer comment: supervision for scooting to right side in bed x 2 bouts. no physical assistance  required. patient refused to get out of bed, stating " I just don't feel like it"  Ambulation/Gait                Stairs            Wheelchair Mobility    Modified Rankin (Stroke Patients Only)       Balance Overall balance assessment: Needs assistance Sitting-balance support: No upper extremity supported Sitting balance-Leahy Scale: Good Sitting balance - Comments: no loss of balance in sitting position                                     Pertinent Vitals/Pain Pain Assessment: No/denies pain    Home Living Family/patient expects to be discharged to:: Private residence Living Arrangements: Other (Comment) (reports he has a "male" that lives there with him. per chart review, patient lives alone) Available Help at Discharge: Family;Available PRN/intermittently Type of Home: Apartment Home Access: Level entry     Home Layout: One level Home Equipment: Cane - single point;Grab bars - tub/shower Additional Comments: prior level of function gathered by patient and chart review    Prior Function Level of Independence: Independent with assistive device(s)         Comments: intermittent cane use for ambulation "when needed". per chart, patient ambulates limited distance recently     Hand Dominance        Extremity/Trunk Assessment   Upper Extremity Assessment Upper Extremity Assessment: Generalized weakness    Lower Extremity Assessment Lower Extremity Assessment: Generalized weakness       Communication   Communication: No difficulties  Cognition Arousal/Alertness: Awake/alert Behavior During Therapy: WFL for tasks assessed/performed Overall Cognitive Status: No family/caregiver present to determine baseline cognitive functioning                                 General Comments: patient able to follow single step commands with extra time      General Comments      Exercises     Assessment/Plan    PT  Assessment Patient needs continued PT services  PT Problem List Decreased strength;Decreased activity tolerance;Decreased balance;Decreased mobility;Decreased cognition       PT Treatment Interventions DME instruction;Gait training;Functional mobility training;Stair training;Therapeutic activities;Therapeutic exercise;Balance training;Neuromuscular re-education;Patient/family education;Cognitive remediation    PT Goals (Current goals can be found in the Care Plan section)  Acute Rehab PT Goals Patient Stated Goal: to get some rest PT Goal Formulation: With patient Time For Goal Achievement: 06/10/20 Potential to Achieve Goals: Fair    Frequency Min 2X/week   Barriers to discharge        Co-evaluation               AM-PAC PT "6 Clicks" Mobility  Outcome Measure Help needed turning from your back to your side while in a flat bed without using bedrails?: None Help needed moving from lying on your back to sitting on the side of a flat bed without using bedrails?: A Little Help needed moving to and from a bed to a chair (including a wheelchair)?: A Little Help needed standing up from a chair using your arms (e.g., wheelchair or bedside chair)?: A Little Help needed to walk in hospital room?: A Lot Help needed climbing 3-5 steps with a railing? : A Lot 6 Click Score: 17    End of Session Equipment Utilized During Treatment: Oxygen Activity Tolerance: Patient limited by fatigue Patient left: in bed;with bed alarm set   PT Visit Diagnosis: Unsteadiness on feet (R26.81);Muscle weakness (generalized) (M62.81)    Time: 2706-2376 PT Time Calculation (min) (ACUTE ONLY): 12 min   Charges:   PT Evaluation $PT Eval Low Complexity: 1 Low          Donna Bernard, PT, MPT  [employer] [employer] 05/27/2020, 12:58 PM

## 2020-05-27 NOTE — Progress Notes (Signed)
Attempted to call report to ICU RN/Per NS, RN will call back.

## 2020-05-27 NOTE — Progress Notes (Signed)
Notified by CCMD that patient had 4 bts of Vtach. Patient was resting when it occurred. Continued monitoring patient to end of shift and report given to oncoming Nurse.

## 2020-05-27 NOTE — Progress Notes (Addendum)
PROGRESS NOTE    Curtis Walters  OEH:[id number] DOB: 20-Jan-1950 DOA: 05/25/2020 PCP: Patient, No Pcp Per (Inactive)  Brief Narrative: 70 year old male with history of nonischemic cardiomyopathy, EF of 20%, mild pulmonary hypertension, moderate MR/TR, mild to moderate AAS, alcohol abuse, alcoholic liver disease, tobacco abuse, chronic back pain presented to the ED with dyspnea.,  He is a very poor historian with significant cognitive and memory deficits as well, could not remember that he had any cardiac disease. -In the emergency room labs were notable for creatinine of 2.0, mildly elevated LFTs, platelet of 75, lactate of 4.6, BNP greater than 4500, CTA without PE, noted ascites, hepatic steatosis, emphysema, moderate right pleural effusion   Assessment & Plan:   Acute on chronic systolic CHF Moderate MR, pulmonary hypertension, moderate TR Mod Right pleural effusion Ascites -History of nonischemic cardiomyopathy, EF of 20 -25%, noncompliance with diuretics, likely diet as well and unfortunately his chronic liver disease also contributing to volume overload -Given Lasix x1 yesterday, held additional doses due to bump in creatinine -Has mod R effusion, will attempt thoracentesis -Monitor kidney function closely  AKI on CKD 3 -Baseline creatinine around 1.4 -with worsening up to 2.6 today, hold off on further diuretics -Thoracentesis today -Monitor urine output and kidney function closely  Suspected Apical thrombus -starting IV heparin  Alcoholic liver disease Passive hepatic congestion also likely contributing to liver damage -Counseled regarding alcoholism -Continue thiamine, monitor for withdrawal  Tachycardia, PVCs -Restart Coreg at a lower dose  Memory, significant cognitive deficits -TSH is normal, B12 level is high  Hyperglycemia -Hemoglobin A 1C is 5.3  Thrombocytopenia -Secondary to chronic liver disease, alcoholism  Tobacco abuse -Counseled  Moderate protein  calorie malnutrition -Supplements as tolerated  DVT prophylaxis: SCDs due to thrombocytopenia Code Status: Full code Family Communication: No family at bedside, called and updated fianc Girtha Rm Disposition Plan:  Status is: Inpatient  Remains inpatient appropriate because:Inpatient level of care appropriate due to severity of illness   Dispo: The patient is from: Home              Anticipated d/c is to: Home              Patient currently is not medically stable to d/c.   Difficult to place patient No  Consultants:   Cardiology   Procedures:   Antimicrobials:    Subjective: -Hungry, wants to eat, has significant memory issues, does not recall ever hearing that he has heart disease or CHF  Objective: Vitals:   05/26/20 2150 05/27/20 0450 05/27/20 0729 05/27/20 1123  BP: 115/86  104/83 100/80  Pulse: 92  (!) 102 99  Resp:   18 16  Temp:   (!) 97.5 F (36.4 C) 97.6 F (36.4 C)  TempSrc:    Oral  SpO2: 90% 97% 100% 100%  Weight:  62.1 kg    Height:        Intake/Output Summary (Last 24 hours) at 05/27/2020 1313 Last data filed at 05/26/2020 1817 Gross per 24 hour  Intake 240 ml  Output 175 ml  Net 65 ml   Filed Weights   05/26/20 0015 05/26/20 2040 05/27/20 0450  Weight: 60.8 kg 62.1 kg 62.1 kg    Examination:  General exam: Appears calm and comfortable  Respiratory system: Clear to auscultation. Respiratory effort normal. Cardiovascular system: S1 & S2 heard, RRR. No JVD, murmurs, rubs, gallops Gastrointestinal system: Abdomen is nondistended, soft and nontender.Normal bowel sounds heard. Central nervous system: Alert and oriented. No  focal neurological deficits. Extremities: Symmetric 5 x 5 power. Skin: No rashes, lesions or ulcers Psychiatry: Judgement and insight appear normal. Mood & affect appropriate.     Data Reviewed:   CBC: Recent Labs  Lab 05/25/20 1146 05/26/20 0554 05/27/20 0412  WBC 6.0 3.1* 8.3  NEUTROABS 3.9  --   --    HGB 13.9 13.3 13.4  HCT 41.5 39.3 39.3  MCV 96.7 96.1 97.3  PLT 75* 67* 59*   Basic Metabolic Panel: Recent Labs  Lab 05/25/20 1146 05/25/20 2315 05/26/20 0554 05/27/20 0412  NA 138  --  139 136  K 3.7  --  4.0 4.6  CL 105  --  107 105  CO2 19*  --  20* 19*  GLUCOSE 87  --  203* 133*  BUN 41*  --  42* 45*  CREATININE 2.03*  --  2.15* 2.69*  CALCIUM 9.2  --  8.9 8.7*  MG  --  1.8  --   --    GFR: Estimated Creatinine Clearance: 22.1 mL/min (A) (by C-G formula based on SCr of 2.69 mg/dL (H)). Liver Function Tests: Recent Labs  Lab 05/25/20 1146 05/26/20 0554  AST 85* 84*  ALT 60* 60*  ALKPHOS 78 74  BILITOT 4.1* 3.7*  PROT 6.9 6.2*  ALBUMIN 3.2* 3.0*   No results for input(s): LIPASE, AMYLASE in the last 168 hours. No results for input(s): AMMONIA in the last 168 hours. Coagulation Profile: Recent Labs  Lab 05/25/20 2315  INR 1.6*   Cardiac Enzymes: Recent Labs  Lab 05/26/20 0554  CKTOTAL 96   BNP (last 3 results) No results for input(s): PROBNP in the last 8760 hours. HbA1C: Recent Labs    05/26/20 0554  HGBA1C 5.3   CBG: Recent Labs  Lab 05/25/20 1141 05/26/20 0004  GLUCAP 84 198*   Lipid Profile: No results for input(s): CHOL, HDL, LDLCALC, TRIG, CHOLHDL, LDLDIRECT in the last 72 hours. Thyroid Function Tests: Recent Labs    05/26/20 0554  TSH 2.375   Anemia Panel: Recent Labs    05/25/20 2315  VITAMINB12 1,487*   Urine analysis:    Component Value Date/Time   COLORURINE AMBER (A) 05/26/2020 0538   APPEARANCEUR CLEAR (A) 05/26/2020 0538   LABSPEC 1.039 (H) 05/26/2020 0538   PHURINE 5.0 05/26/2020 0538   GLUCOSEU NEGATIVE 05/26/2020 0538   HGBUR SMALL (A) 05/26/2020 0538   BILIRUBINUR NEGATIVE 05/26/2020 0538   KETONESUR NEGATIVE 05/26/2020 0538   PROTEINUR 100 (A) 05/26/2020 0538   NITRITE NEGATIVE 05/26/2020 0538   LEUKOCYTESUR NEGATIVE 05/26/2020 0538   Sepsis  Labs: @LABRCNTIP (procalcitonin:4,lacticidven:4)  ) Recent Results (from the past 240 hour(s))  Blood culture (single)     Status: None (Preliminary result)   Collection Time: 05/25/20  3:16 PM   Specimen: BLOOD  Result Value Ref Range Status   Specimen Description BLOOD Blood Culture adequate volume  Final   Special Requests   Final    BOTTLES DRAWN AEROBIC AND ANAEROBIC BLOOD RIGHT FOREARM   Culture  Setup Time   Final    Organism ID to follow NONE GRAM POSITIVE COCCI AEROBIC BOTTLE ONLY CRITICAL RESULT CALLED TO, READ BACK BY AND VERIFIED WITH: JASON ROBBINS @[id number]  05/27/20 Performed at [facility], [address] [place], [address] [place]    Culture [facility] POSITIVE COCCI  Final   Report Status PENDING  Incomplete  Blood Culture ID Panel (Reflexed)     Status: None   Collection Time: 05/25/20  3:16  PM  Result Value Ref Range Status   Enterococcus faecalis NOT DETECTED NOT DETECTED Final   Enterococcus Faecium NOT DETECTED NOT DETECTED Final   Listeria monocytogenes NOT DETECTED NOT DETECTED Final   Staphylococcus species NOT DETECTED NOT DETECTED Final   Staphylococcus aureus (BCID) NOT DETECTED NOT DETECTED Final   Staphylococcus epidermidis NOT DETECTED NOT DETECTED Final   Staphylococcus lugdunensis NOT DETECTED NOT DETECTED Final   Streptococcus species NOT DETECTED NOT DETECTED Final   Streptococcus agalactiae NOT DETECTED NOT DETECTED Final   Streptococcus pneumoniae NOT DETECTED NOT DETECTED Final   Streptococcus pyogenes NOT DETECTED NOT DETECTED Final   A.calcoaceticus-baumannii NOT DETECTED NOT DETECTED Final   Bacteroides fragilis NOT DETECTED NOT DETECTED Final   Enterobacterales NOT DETECTED NOT DETECTED Final   Enterobacter cloacae complex NOT DETECTED NOT DETECTED Final   Escherichia coli NOT DETECTED NOT DETECTED Final   Klebsiella aerogenes NOT DETECTED NOT DETECTED Final   Klebsiella oxytoca NOT DETECTED NOT DETECTED Final   Klebsiella  pneumoniae NOT DETECTED NOT DETECTED Final   Proteus species NOT DETECTED NOT DETECTED Final   Salmonella species NOT DETECTED NOT DETECTED Final   Serratia marcescens NOT DETECTED NOT DETECTED Final   Haemophilus influenzae NOT DETECTED NOT DETECTED Final   Neisseria meningitidis NOT DETECTED NOT DETECTED Final   Pseudomonas aeruginosa NOT DETECTED NOT DETECTED Final   Stenotrophomonas maltophilia NOT DETECTED NOT DETECTED Final   Candida albicans NOT DETECTED NOT DETECTED Final   Candida auris NOT DETECTED NOT DETECTED Final   Candida glabrata NOT DETECTED NOT DETECTED Final   Candida krusei NOT DETECTED NOT DETECTED Final   Candida parapsilosis NOT DETECTED NOT DETECTED Final   Candida tropicalis NOT DETECTED NOT DETECTED Final   Cryptococcus neoformans/gattii NOT DETECTED NOT DETECTED Final    Comment: Performed at [facility], [address] [place], [place] [address]  SARS CORONAVIRUS 2 (TAT 6-24 HRS) Nasopharyngeal Nasopharyngeal Swab     Status: None   Collection Time: 05/25/20  3:21 PM   Specimen: Nasopharyngeal Swab  Result Value Ref Range Status   SARS Coronavirus 2 NEGATIVE NEGATIVE Final    Comment: (NOTE) SARS-CoV-2 target nucleic acids are NOT DETECTED.  The SARS-CoV-2 RNA is generally detectable in upper and lower respiratory specimens during the acute phase of infection. Negative results do not preclude SARS-CoV-2 infection, do not rule out co-infections with other pathogens, and should not be used as the sole basis for treatment or other patient management decisions. Negative results must be combined with clinical observations, patient history, and epidemiological information. The expected result is Negative.  Fact Sheet for Patients: HairSlick.no  Fact Sheet for Healthcare Providers: quierodirigir.com  This test is not yet approved or cleared by the [place] FDA and  has been authorized  for detection and/or diagnosis of SARS-CoV-2 by FDA under an Emergency Use Authorization (EUA). This EUA will remain  in effect (meaning this test can be used) for the duration of the COVID-19 declaration under Se ction 564(b)(1) of the Act, 21 U.S.C. section 360bbb-3(b)(1), unless the authorization is terminated or revoked sooner.  Performed at [facility], [address] [place], [place] [address]   MRSA PCR Screening     Status: None   Collection Time: 05/26/20 12:10 AM   Specimen: Nasal Mucosa; Nasopharyngeal  Result Value Ref Range Status   MRSA by PCR NEGATIVE NEGATIVE Final    Comment:        The GeneXpert MRSA Assay (FDA approved for  NASAL specimens only), is one component of a comprehensive MRSA colonization surveillance program. It is not intended to diagnose MRSA infection nor to guide or monitor treatment for MRSA infections. Performed at [facility], [address], [place], [place] [address]          Radiology Studies: CT ANGIO CHEST PE W OR WO CONTRAST  Result Date: 05/25/2020 CLINICAL DATA:  Shortness of breath. Chest pain or back pain. Suspect aortic dissection. Shortness of breath, worse lying flat. Weakness and confusion. EXAM: CT ANGIOGRAPHY CHEST WITH CONTRAST TECHNIQUE: Multidetector CT imaging of the chest was performed using the standard protocol during bolus administration of intravenous contrast. Multiplanar CT image reconstructions and MIPs were obtained to evaluate the vascular anatomy. The technologist discussed the patient with Dr. Cyril Loosen prior to exam. Request was made for optimal evaluation of pulmonary arteries. CONTRAST:  50mL OMNIPAQUE IOHEXOL 350 MG/ML SOLN COMPARISON:  08/25/2019 FINDINGS: Cardiovascular: Stable cardiomegaly. No pericardial effusion. Coronary artery calcifications are present. There is atherosclerotic calcification of the thoracic aorta. Ascending aorta measures 4.0 centimeters. No displaced intimal  calcifications indicate presence of dissection. The contrast bolus was timed for optimal opacification of the pulmonary arteries. There is no evidence for acute pulmonary embolism. Mediastinum/Nodes: The visualized portion of the thyroid gland has a normal appearance. The esophagus is normal in appearance. No significant mediastinal, hilar, or axillary adenopathy. Lungs/Pleura: Moderate RIGHT pleural effusion. Scattered panlobular emphysematous changes. No suspicious pulmonary nodules. No consolidations. Upper Abdomen: The liver is diffusely low attenuation, consistent with hepatic steatosis. Ascites identified in the UPPER abdomen around the spleen and liver. Musculoskeletal: No chest wall abnormality. No acute or significant osseous findings. Review of the MIP images confirms the above findings. IMPRESSION: 1. No evidence for acute pulmonary embolus. 2. Study was performed with optimal evaluation of pulmonary arteries. However, there is no evidence for displaced intimal calcifications to indicate aortic dissection. 3. 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 Recommend annual imaging followup by CTA or MRA. This recommendation follows 2010 ACCF/AHA/AATS/ACR/ASA/SCA/SCAI/SIR/STS/SVM Guidelines for the Diagnosis and Management of Patients with Thoracic Aortic Disease. Circulation. 2010; 121: U045-W098. Aortic aneurysm NOS (ICD10-I71.9) 4. Coronary artery calcifications. 5.  Aortic atherosclerosis.  (ICD10-I70.0) 6. Stable cardiomegaly. 7. Moderate RIGHT pleural effusion. 8. Hepatic steatosis. 9. Ascites in the UPPER abdomen. 10.  Emphysema (ICD10-J43.9). Electronically Signed   By: Norva Pavlov M.D.   On: 05/25/2020 14:49   US Abdomen Complete  Result Date: 05/25/2020 CLINICAL DATA:  Ascites, renal failure and hepatic steatosis EXAM: ABDOMEN ULTRASOUND COMPLETE COMPARISON:  Same day CT chest. FINDINGS: Gallbladder: No gallstones or wall thickening visualized. No sonographic Murphy sign noted  by sonographer. Common bile duct: Diameter: 7 mm which is within normal limits for patient's age. Liver: No focal lesion identified. Coarsened echotexture with increased parenchymal echogenicity. Portal vein is patent on color Doppler imaging with normal direction of blood flow towards the liver. IVC: No abnormality visualized. Pancreas: Visualized portion unremarkable. Spleen: Size and appearance within normal limits. Right Kidney: Length: 9.9 cm. Echogenicity within normal limits. No mass or hydronephrosis visualized. Left Kidney: Length: 10.1 cm. Echogenicity within normal limits. 1.6 cm renal cyst. No solid mass or hydronephrosis visualized. Abdominal aorta: No aneurysm visualized. Other findings: Small volume ascites. IMPRESSION: Coarsened echotexture with increased parenchymal echogenicity suggesting fatty infiltration or other diffuse hepatocellular disease. Small volume ascites. Electronically Signed   By: Maudry Mayhew MD   On: 05/25/2020 21:12   ECHOCARDIOGRAM COMPLETE  Result Date: 05/27/2020    ECHOCARDIOGRAM REPORT  Patient Name:   HOWIE RUFUS Date of Exam: 05/27/2020 Medical Rec #:  [id number]   Height:       73.0 in Accession #:    [id number]  Weight:       137.0 lb Date of Birth:  1951-01-01    BSA:          1.832 m Patient Age:    70 years    BP:           115/86 mmHg Patient Gender: M           HR:           92 bpm. Exam Location:  [facility] Procedure: 2D Echo, Color Doppler, Cardiac Doppler and 3D Echo Indications:     CHF-acute systolic I50.21  History:         Patient has prior history of Echocardiogram examinations, most                  recent 08/25/2019.  Sonographer:     Cristela Blue RDCS (AE) Referring Phys:  [id number] Lennon Alstrom Diagnosing Phys: Lorine Bears MD IMPRESSIONS  1. Left ventricular ejection fraction, by estimation, is 20 to 25%. The left ventricle has severely decreased function. The left ventricle demonstrates global hypokinesis. The left ventricular internal cavity size  was moderately dilated. Left ventricular diastolic parameters are consistent with Grade II diastolic dysfunction (pseudonormalization).  2. Right ventricular systolic function is mildly reduced. The right ventricular size is moderately enlarged.  3. Left atrial size was mildly dilated.  4. Right atrial size was moderately dilated.  5. The mitral valve is abnormal. Moderate to severe mitral valve regurgitation. No evidence of mitral stenosis.  6. Tricuspid valve regurgitation is moderate to severe.  7. The aortic valve is abnormal. Aortic valve regurgitation is moderate. Mild aortic valve sclerosis is present, with no evidence of aortic valve stenosis.  8. The inferior vena cava is normal in size with greater than 50% respiratory variability, suggesting right atrial pressure of 3 mmHg.  9. Rounded opacity in the apex could be an organized thrombus. Consider cardiac MRI for evaluation. FINDINGS  Left Ventricle: Left ventricular ejection fraction, by estimation, is 20 to 25%. The left ventricle has severely decreased function. The left ventricle demonstrates global hypokinesis. The left ventricular internal cavity size was moderately dilated. There is no left ventricular hypertrophy. Left ventricular diastolic parameters are consistent with Grade II diastolic dysfunction (pseudonormalization). Right Ventricle: The right ventricular size is moderately enlarged. No increase in right ventricular wall thickness. Right ventricular systolic function is mildly reduced. Left Atrium: Left atrial size was mildly dilated. Right Atrium: Right atrial size was moderately dilated. Pericardium: There is no evidence of pericardial effusion. Mitral Valve: The mitral valve is abnormal. Mild mitral annular calcification. Moderate to severe mitral valve regurgitation. No evidence of mitral valve stenosis. Tricuspid Valve: The tricuspid valve is normal in structure. Tricuspid valve regurgitation is moderate to severe. No evidence of  tricuspid stenosis. Aortic Valve: The aortic valve is abnormal. Aortic valve regurgitation is moderate. Aortic regurgitation PHT measures 466 msec. Mild aortic valve sclerosis is present, with no evidence of aortic valve stenosis. Aortic valve mean gradient measures 1.0 mmHg. Aortic valve peak gradient measures 2.2 mmHg. Aortic valve area, by VTI measures 2.37 cm. Pulmonic Valve: The pulmonic valve was normal in structure. Pulmonic valve regurgitation is mild. No evidence of pulmonic stenosis. Aorta: The aortic root is normal in size and structure. Venous: The inferior vena cava is normal in  size with greater than 50% respiratory variability, suggesting right atrial pressure of 3 mmHg. IAS/Shunts: No atrial level shunt detected by color flow Doppler.  LEFT VENTRICLE PLAX 2D LVIDd:         6.07 cm      Diastology LVIDs:         5.82 cm      LV e' medial:    3.70 cm/s LV PW:         1.24 cm      LV E/e' medial:  21.3 LV IVS:        1.02 cm      LV e' lateral:   1.52 cm/s LVOT diam:     2.10 cm      LV E/e' lateral: 51.8 LV SV:         24 LV SV Index:   13 LVOT Area:     3.46 cm                              3D Volume EF: LV Volumes (MOD)            3D EF:        27 % LV vol d, MOD A2C: 194.0 ml LV EDV:       262 ml LV vol d, MOD A4C: 202.0 ml LV ESV:       190 ml LV vol s, MOD A2C: 161.0 ml LV SV:        72 ml LV vol s, MOD A4C: 144.0 ml LV SV MOD A2C:     33.0 ml LV SV MOD A4C:     202.0 ml LV SV MOD BP:      50.0 ml LEFT ATRIUM         Index LA diam:    3.50 cm 1.91 cm/m  AORTIC VALVE                   PULMONIC VALVE AV Area (Vmax):    2.09 cm    PV Vmax:        0.20 m/s AV Area (Vmean):   1.79 cm    PV Peak grad:   0.2 mmHg AV Area (VTI):     2.37 cm    RVOT Peak grad: 0 mmHg AV Vmax:           74.60 cm/s AV Vmean:          55.600 cm/s AV VTI:            0.100 m AV Peak Grad:      2.2 mmHg AV Mean Grad:      1.0 mmHg LVOT Vmax:         45.00 cm/s LVOT Vmean:        28.700 cm/s LVOT VTI:          0.068 m  LVOT/AV VTI ratio: 0.68 AI PHT:            466 msec  AORTA Ao Root diam: 3.73 cm MITRAL VALVE               TRICUSPID VALVE MV Area (PHT): 4.44 cm    TR Peak grad:   14.4 mmHg MV Decel Time: 171 msec    TR Vmax:        190.00 cm/s MV E velocity: 78.80 cm/s  SHUNTS                            Systemic VTI:  0.07 m                            Systemic Diam: 2.10 cm Lorine Bears MD Electronically signed by Lorine Bears MD Signature Date/Time: 05/27/2020/11:44:21 AM    Final     Scheduled Meds: . aspirin EC  81 mg Oral Daily  . Chlorhexidine Gluconate Cloth  6 each Topical Daily  . thiamine injection  100 mg Intravenous Daily   Continuous Infusions:   LOS: 2 days    Time spent:  Zannie Cove, MD [facility]  05/27/2020, 1:13 PM

## 2020-05-28 ENCOUNTER — Inpatient Hospital Stay: Payer: Medicare Other

## 2020-05-28 DIAGNOSIS — L8990 Pressure ulcer of unspecified site, unspecified stage: Secondary | ICD-10-CM | POA: Insufficient documentation

## 2020-05-28 DIAGNOSIS — R0602 Shortness of breath: Secondary | ICD-10-CM | POA: Diagnosis not present

## 2020-05-28 LAB — BASIC METABOLIC PANEL
Anion gap: 10 (ref 5–15)
BUN: 55 mg/dL — ABNORMAL HIGH (ref 8–23)
CO2: 21 mmol/L — ABNORMAL LOW (ref 22–32)
Calcium: 8.5 mg/dL — ABNORMAL LOW (ref 8.9–10.3)
Chloride: 105 mmol/L (ref 98–111)
Creatinine, Ser: 3.18 mg/dL — ABNORMAL HIGH (ref 0.61–1.24)
GFR, Estimated: 20 mL/min — ABNORMAL LOW (ref >60)
Glucose, Bld: 73 mg/dL (ref 70–99)
Potassium: 4.7 mmol/L (ref 3.5–5.1)
Sodium: 136 mmol/L (ref 135–145)

## 2020-05-28 LAB — HEPARIN LEVEL (UNFRACTIONATED)
Heparin Unfractionated: 0.67 IU/mL (ref 0.30–0.70)
Heparin Unfractionated: 0.63 [IU]/mL (ref 0.30–0.70)

## 2020-05-28 LAB — CBC
HCT: 34.8 % — ABNORMAL LOW (ref 39.0–52.0)
Hemoglobin: 12.1 g/dL — ABNORMAL LOW (ref 13.0–17.0)
MCH: 33.6 pg (ref 26.0–34.0)
MCHC: 34.8 g/dL (ref 30.0–36.0)
MCV: 96.7 fL (ref 80.0–100.0)
Platelets: 54 10*3/uL — ABNORMAL LOW (ref 150–400)
RBC: 3.6 MIL/uL — ABNORMAL LOW (ref 4.22–5.81)
RDW: 17.9 % — ABNORMAL HIGH (ref 11.5–15.5)
WBC: 9.2 10*3/uL (ref 4.0–10.5)
nRBC: 1 % — ABNORMAL HIGH (ref 0.0–0.2)

## 2020-05-28 LAB — BLOOD GAS, VENOUS
Acid-base deficit: 0.4 mmol/L (ref 0.0–2.0)
Bicarbonate: 24.8 mmol/L (ref 20.0–28.0)
O2 Saturation: 82.5 %
Patient temperature: 37
pCO2, Ven: 42 mmHg — ABNORMAL LOW (ref 44.0–60.0)
pH, Ven: 7.38 (ref 7.250–7.430)
pO2, Ven: 48 mmHg — ABNORMAL HIGH (ref 32.0–45.0)

## 2020-05-28 LAB — PHOSPHORUS: Phosphorus: 4.2 mg/dL (ref 2.5–4.6)

## 2020-05-28 LAB — GLUCOSE, CAPILLARY: Glucose-Capillary: 83 mg/dL (ref 70–99)

## 2020-05-28 LAB — MAGNESIUM: Magnesium: 1.8 mg/dL (ref 1.7–2.4)

## 2020-05-28 MED ORDER — MIDAZOLAM HCL 2 MG/2ML IJ SOLN
INTRAMUSCULAR | Status: AC
  Administered 2020-05-28: 1 mg via INTRAVENOUS
  Filled 2020-05-28: qty 2

## 2020-05-28 MED ORDER — FENTANYL CITRATE (PF) 100 MCG/2ML IJ SOLN
25.0000 ug | Freq: Once | INTRAMUSCULAR | Status: AC
  Administered 2020-05-28: 25 ug via INTRAVENOUS

## 2020-05-28 MED ORDER — MIDAZOLAM HCL 2 MG/2ML IJ SOLN: 1.0000 mg | Freq: Once | INTRAMUSCULAR | Status: AC

## 2020-05-28 MED ORDER — MIDAZOLAM HCL 2 MG/2ML IJ SOLN
1.0000 mg | Freq: Once | INTRAMUSCULAR | Status: AC
  Administered 2020-05-28: 1 mg via INTRAVENOUS

## 2020-05-28 MED ORDER — FENTANYL CITRATE (PF) 100 MCG/2ML IJ SOLN
25.0000 ug | Freq: Once | INTRAMUSCULAR | Status: AC
  Administered 2020-05-28: 25 ug via INTRAVENOUS
  Filled 2020-05-28: qty 2

## 2020-05-28 MED ORDER — MAGNESIUM SULFATE 2 GM/50ML IV SOLN
2.0000 g | Freq: Once | INTRAVENOUS | Status: AC
  Administered 2020-05-28: 2 g via INTRAVENOUS
  Filled 2020-05-28: qty 50

## 2020-05-28 MED ORDER — SODIUM CHLORIDE 0.9 % IV SOLN: INTRAVENOUS | Status: AC

## 2020-05-28 MED ORDER — THIAMINE HCL 100 MG PO TABS: 100.0000 mg | ORAL_TABLET | Freq: Every day | ORAL | Status: DC

## 2020-05-28 NOTE — Plan of Care (Signed)
PMT note:  Patient is resting in bed. He barely opens eyes and closes them quickly. He does not speak. No family at bedside at this time. Attempted to reach emergency contact Claris Che unsuccessfully. Will re-attempt tomorrow.

## 2020-05-28 NOTE — Consult Note (Signed)
ANTICOAGULATION CONSULT NOTE - Consult  Pharmacy Consult for Heparin gtt Indication: LV Apical Thrombus  Allergies  Allergen Reactions  . Penicillins Other (See Comments)    Did it involve swelling of the face/tongue/throat, SOB, or low BP? No Did it involve sudden or severe rash/hives, skin peeling, or any reaction on the inside of your mouth or nose? No Did you need to seek medical attention at a hospital or doctor's office? No When did it last happen? If all above answers are "NO", may proceed with cephalosporin use.    Patient Measurements: Height: 6\' 1"  (185.4 cm) Weight: 62.1 kg (137 lb) IBW/kg (Calculated) : 79.9 Heparin Dosing Weight: 62.1 kg  Vital Signs: Temp: 97.2 F (36.2 C) (05/17 0000) Temp Source: Axillary (05/17 0000) BP: 100/82 (05/17 0200) Pulse Rate: 93 (05/17 0200)  Labs: Recent Labs    05/25/20 1146 05/25/20 1147 05/25/20 1624 05/25/20 1754 05/25/20 2217 05/25/20 2315 05/26/20 0554 05/27/20 0412 05/27/20 1544 05/28/20 0155  HGB 13.9  --   --   --   --   --  13.3 13.4  --  12.1*  HCT 41.5  --   --   --   --   --  39.3 39.3  --  34.8*  PLT 75*  --   --   --   --   --  67* 59*  --  54*  APTT  --   --   --   --   --   --   --   --  31  --   LABPROT  --   --   --   --   --  19.4*  --   --   --   --   INR  --   --   --   --   --  1.6*  --   --   --   --   HEPARINUNFRC  --   --   --   --   --   --   --   --   --  0.67  CREATININE 2.03*  --   --   --   --   --  2.15* 2.69*  --   --   CKTOTAL  --   --   --   --   --   --  96  --   --   --   TROPONINIHS  --    < > 79* 80* 67*  --   --   --   --   --    < > = values in this interval not displayed.    Estimated Creatinine Clearance: 22.1 mL/min (A) (by C-G formula based on SCr of 2.69 mg/dL (H)).   Medications:  No AC PTA Inpatient only ASA 81mg  QD prior to hep gtt start.  Heparin Dosing Weight: 62.1 kg  Assessment: 70yo male w/ h/o niCMP (EF  20%), mild pulmHTN & tobacco abuse, mod  MR/TR, mild-mod AAS, EtOH abuse c/b alcoholic liver disease a/w ascites, chronic back pain presented to the ED with dyspnea. Pt developing worsening of renal fxn thought to be 2/2 to cardiorenal s/o and dec'd perfusion.   On 5/16 pt demonstrated Cheyne-Stokes breathing and became dyspneic and dizzy with attempted time up to chair with PT. On 5/16 w/u Pt's EF noted 20-25% on ECHO and LV Apical Thrombus appreciated. Milrinone CIVI started and transfer to CCU. Pharmacy consulted for the mgmt of heparin gtt.  Baseline INR 1.6; Hgb 13.9>13.4; Plts 75>67>59 (prior to hep start) APTT - ordered/pending     Goal of Therapy:  Heparin level 0.3-0.7 units/ml Monitor platelets by anticoagulation protocol: Yes   0517 0155 - Received call from [facility] (Lab), saying lab drawn late due to pt being hard stick and difficulties of getting sufficient sample volume, due to blood continuing to clot when trying to fill tubes.  0517 0155 HL, therapeutic x 1   Plan:  Continue heparin infusion at 1000 units/hr Recheck HLl in 8 hours to confirm, then daily. Monitor CBC daily while on heparin.  Otelia Sergeant, PharmD, [facility] 05/28/2020 3:41 AM

## 2020-05-28 NOTE — TOC Progression Note (Signed)
Transition of Care [facility]) - Progression Note    Patient Details  Name: Curtis Walters MRN: [id number] Date of Birth: [birth year]  Transition of Care [facility]) CM/SW Contact  Hetty Ely, RN Phone Number: 05/28/2020, 2:55 PM  Clinical Narrative: Palliative consult pending for discussion with family about goals of care. Patient daughter and son is scheduled to come in tomorrow. TOC will continue to track and meet with family following Palliative meeting.           Expected Discharge Plan and Services                                                 Social Determinants of Health (SDOH) Interventions    Readmission Risk Interventions No flowsheet data found.

## 2020-05-28 NOTE — Consult Note (Signed)
[facility]  CONSULT NOTE    Date: 05/28/2020                  Patient Name:  Curtis Walters  MRN: [id number]  DOB: [birth year]  Age / Sex: 70 y.o., male         PCP: Patient, No Pcp Per (Inactive)                 Service Requesting Consult: Cardiology                 Reason for Consult: AKI            History of Present Illness: Curtis Walters is a 70 y.o.  male with , who was admitted to [facility] on 05/25/2020 for acute on chronic CHF. Precious medical concerns include chronic back pain, CAD, AAA, reduced EF heart failure, tobacco abuse, and hypertension. He presented to the ED with shortness of breath.   We were consulted to evaluate rise in creatinine. On admission, labs consisted of BUN 41, Creatinine 2.03, eGFR 34, Sodium 138, and Potassium 3.7. Patient had CT angio on 5/14 with contrast. Patient transferred to ICU on 05/28/20 for persistent cardiomyopathy and declining renal function. Patient seen after central line placed. Patient sedated  Milrinone gtt Foley O2-3L [place]   Medications: Outpatient medications: Medications Prior to Admission  Medication Sig Dispense Refill Last Dose  . metoprolol succinate (TOPROL XL) 25 MG 24 hr tablet Take 1 tablet (25 mg total) by mouth daily. (Patient not taking: Reported on 05/25/2020) 30 tablet 11 Not Taking at Unknown time  . torsemide (DEMADEX) 20 MG tablet Take 2 tablets (40 mg total) by mouth daily. 60 tablet 4     Current medications: Current Facility-Administered Medications  Medication Dose Route Frequency Provider Last Rate Last Admin  . acetaminophen (TYLENOL) tablet 650 mg  650 mg Oral Q6H PRN Para Skeans, MD   650 mg at 05/27/20 1941   Or  . acetaminophen (TYLENOL) suppository 650 mg  650 mg Rectal Q6H PRN Para Skeans, MD      . ALPRAZolam Duanne Moron) tablet 0.25 mg  0.25 mg Oral BID PRN Domenic Polite, MD   0.25 mg at 05/27/20 1358  . aspirin EC tablet 81 mg  81 mg Oral Daily Para Skeans, MD   81 mg at  05/28/20 5852  . Chlorhexidine Gluconate Cloth 2 % PADS 6 each  6 each Topical Daily Lang Snow, NP   6 each at 05/28/20 1130  . folic acid (FOLVITE) tablet 1 mg  1 mg Oral Daily Carefree, Carole N, DO   1 mg at 05/28/20 7782  . heparin ADULT infusion 100 units/mL (25000 units/236m)  1,000 Units/hr Intravenous Continuous BLorna Dibble RPH   Held at 05/28/20 04235 . LORazepam (ATIVAN) tablet 1-4 mg  1-4 mg Oral Q1H PRN HKayleen Memos DO       Or  . LORazepam (ATIVAN) injection 1-4 mg  1-4 mg Intravenous Q1H PRN HIrene PapN, DO   2 mg at 05/27/20 2240  . milrinone (PRIMACOR) 20 MG/100 ML (0.2 mg/mL) infusion  0.125 mcg/kg/min Intravenous Continuous AKathlyn SacramentoA, MD 2.33 mL/hr at 05/28/20 0600 0.125 mcg/kg/min at 05/28/20 0600  . multivitamin with minerals tablet 1 tablet  1 tablet Oral Daily HIrene PapN, DO   1 tablet at 05/28/20 03614 . thiamine tablet 100 mg  100 mg Oral Daily JDomenic Polite MD  Allergies: Allergies  Allergen Reactions  . Penicillins Other (See Comments)    Did it involve swelling of the face/tongue/throat, SOB, or low BP? No Did it involve sudden or severe rash/hives, skin peeling, or any reaction on the inside of your mouth or nose? No Did you need to seek medical attention at a hospital or doctor's office? No When did it last happen? If all above answers are "NO", may proceed with cephalosporin use.      Past Medical History: Past Medical History:  Diagnosis Date  . Alcohol use   . Chronic back pain   . Coronary artery disease, non-occlusive   . Dilated aortic root ([place])   . HFrEF (heart failure with reduced ejection fraction) ([place])   . Hypertension   . NICM (nonischemic cardiomyopathy) ([place])   . Tobacco abuse   . Transaminitis      Past Surgical History: Past Surgical History:  Procedure Laterality Date  . RIGHT AND LEFT HEART CATH N/A 08/28/2019   Procedure: RIGHT AND LEFT HEART CATH with possible percutaneous  intervention;  Surgeon: Wellington Hampshire, MD;  Location: [facility];  Service: Cardiovascular;  Laterality: N/A;     Family History: Family History  Problem Relation Age of Onset  . Sickle cell anemia Sister      Social History: Social History   Socioeconomic History  . Marital status: Single    Spouse name: Not on file  . Number of children: Not on file  . Years of education: Not on file  . Highest education level: Not on file  Occupational History  . Not on file  Tobacco Use  . Smoking status: Current Every Day Smoker    Types: Cigarettes  . Smokeless tobacco: Never Used  Substance and Sexual Activity  . Alcohol use: Yes    Alcohol/week: 35.0 standard drinks    Types: 14 Cans of beer, 21 Shots of liquor per week  . Drug use: Not Currently  . Sexual activity: Not Currently  Other Topics Concern  . Not on file  Social History Narrative  . Not on file   Social Determinants of Health   Financial Resource Strain: Not on file  Food Insecurity: Not on file  Transportation Needs: Not on file  Physical Activity: Not on file  Stress: Not on file  Social Connections: Not on file  Intimate Partner Violence: Not on file     Review of Systems: ROS  Vital Signs: Blood pressure (!) 113/97, pulse 100, temperature (!) 97.4 F (36.3 C), temperature source Axillary, resp. rate (!) 25, height '6\' 1"'  (1.854 m), weight 64 kg, SpO2 98 %.  Weight trends: Filed Weights   05/26/20 2040 05/27/20 0450 05/28/20 0440  Weight: 62.1 kg 62.1 kg 64 kg    Physical Exam: General: NAD  Head: Normocephalic, atraumatic. Moist oral mucosal membranes  Eyes: Anicteric  Lungs:  Clear to auscultation, 3L [place]  Heart: Regular rate and rhythm  Abdomen:  Soft, nontender, bowel sounds present  Extremities:  trace peripheral edema.  Neurologic: Sedated  Skin: No lesions    Lab results: Basic Metabolic Panel: Recent Labs  Lab 05/25/20 2315 05/26/20 0554 05/27/20 0412  05/28/20 0155 05/28/20 1012  NA  --  139 136  --  136  K  --  4.0 4.6  --  4.7  CL  --  107 105  --  105  CO2  --  20* 19*  --  21*  GLUCOSE  --  203*  133*  --  73  BUN  --  42* 45*  --  55*  CREATININE  --  2.15* 2.69*  --  3.18*  CALCIUM  --  8.9 8.7*  --  8.5*  MG 1.8  --   --  1.8  --   PHOS  --   --   --  4.2  --     Liver Function Tests: Recent Labs  Lab 05/25/20 1146 05/26/20 0554  AST 85* 84*  ALT 60* 60*  ALKPHOS 78 74  BILITOT 4.1* 3.7*  PROT 6.9 6.2*  ALBUMIN 3.2* 3.0*   No results for input(s): LIPASE, AMYLASE in the last 168 hours. No results for input(s): AMMONIA in the last 168 hours.  CBC: Recent Labs  Lab 05/25/20 1146 05/26/20 0554 05/27/20 0412 05/28/20 0155  WBC 6.0 3.1* 8.3 9.2  NEUTROABS 3.9  --   --   --   HGB 13.9 13.3 13.4 12.1*  HCT 41.5 39.3 39.3 34.8*  MCV 96.7 96.1 97.3 96.7  PLT 75* 67* 59* 54*    Cardiac Enzymes: Recent Labs  Lab 05/26/20 0554  CKTOTAL 96    BNP: Invalid input(s): POCBNP  CBG: Recent Labs  Lab 05/26/20 0004 05/27/20 1632 05/27/20 1930 05/27/20 2309 05/28/20 0319  GLUCAP 198* 99 93 110* 34    Microbiology: Results for orders placed or performed during the hospital encounter of 05/25/20  Blood culture (single)     Status: None (Preliminary result)   Collection Time: 05/25/20  3:16 PM   Specimen: BLOOD  Result Value Ref Range Status   Specimen Description   Final    BLOOD Blood Culture adequate volume Performed at [facility], [address], [place], [place] [address]    Special Requests   Final    BOTTLES DRAWN AEROBIC AND ANAEROBIC BLOOD RIGHT FOREARM Performed at [facility], [address], [place], [place] [address]    Culture  Setup Time   Final    GRAM POSITIVE COCCI AEROBIC BOTTLE ONLY CRITICAL RESULT CALLED TO, READ BACK BY AND VERIFIED WITH: JASON ROBBINS '@[id number]'  05/27/20    Culture   Final    GRAM POSITIVE COCCI IDENTIFICATION TO FOLLOW Performed at  [facility], [place] [address] [place], [place] [address]    Report Status PENDING  Incomplete  Blood Culture ID Panel (Reflexed)     Status: None   Collection Time: 05/25/20  3:16 PM  Result Value Ref Range Status   Enterococcus faecalis NOT DETECTED NOT DETECTED Final   Enterococcus Faecium NOT DETECTED NOT DETECTED Final   Listeria monocytogenes NOT DETECTED NOT DETECTED Final   Staphylococcus species NOT DETECTED NOT DETECTED Final   Staphylococcus aureus (BCID) NOT DETECTED NOT DETECTED Final   Staphylococcus epidermidis NOT DETECTED NOT DETECTED Final   Staphylococcus lugdunensis NOT DETECTED NOT DETECTED Final   Streptococcus species NOT DETECTED NOT DETECTED Final   Streptococcus agalactiae NOT DETECTED NOT DETECTED Final   Streptococcus pneumoniae NOT DETECTED NOT DETECTED Final   Streptococcus pyogenes NOT DETECTED NOT DETECTED Final   A.calcoaceticus-baumannii NOT DETECTED NOT DETECTED Final   Bacteroides fragilis NOT DETECTED NOT DETECTED Final   Enterobacterales NOT DETECTED NOT DETECTED Final   Enterobacter cloacae complex NOT DETECTED NOT DETECTED Final   Escherichia coli NOT DETECTED NOT DETECTED Final   Klebsiella aerogenes NOT DETECTED NOT DETECTED Final   Klebsiella oxytoca NOT DETECTED NOT DETECTED Final   Klebsiella pneumoniae NOT DETECTED NOT DETECTED Final  Proteus species NOT DETECTED NOT DETECTED Final   Salmonella species NOT DETECTED NOT DETECTED Final   Serratia marcescens NOT DETECTED NOT DETECTED Final   Haemophilus influenzae NOT DETECTED NOT DETECTED Final   Neisseria meningitidis NOT DETECTED NOT DETECTED Final   Pseudomonas aeruginosa NOT DETECTED NOT DETECTED Final   Stenotrophomonas maltophilia NOT DETECTED NOT DETECTED Final   Candida albicans NOT DETECTED NOT DETECTED Final   Candida auris NOT DETECTED NOT DETECTED Final   Candida glabrata NOT DETECTED NOT DETECTED Final   Candida krusei NOT DETECTED NOT DETECTED Final   Candida  parapsilosis NOT DETECTED NOT DETECTED Final   Candida tropicalis NOT DETECTED NOT DETECTED Final   Cryptococcus neoformans/gattii NOT DETECTED NOT DETECTED Final    Comment: Performed at [facility], [place], [place] [address]  SARS CORONAVIRUS 2 (TAT 6-24 HRS) Nasopharyngeal Nasopharyngeal Swab     Status: None   Collection Time: 05/25/20  3:21 PM   Specimen: Nasopharyngeal Swab  Result Value Ref Range Status   SARS Coronavirus 2 NEGATIVE NEGATIVE Final    Comment: (NOTE) SARS-CoV-2 target nucleic acids are NOT DETECTED.  The SARS-CoV-2 RNA is generally detectable in upper and lower respiratory specimens during the acute phase of infection. Negative results do not preclude SARS-CoV-2 infection, do not rule out co-infections with other pathogens, and should not be used as the sole basis for treatment or other patient management decisions. Negative results must be combined with clinical observations, patient history, and epidemiological information. The expected result is Negative.  Fact Sheet for Patients: SugarRoll.be  Fact Sheet for Healthcare Providers: https://www.woods-mathews.com/  This test is not yet approved or cleared by the [place] FDA and  has been authorized for detection and/or diagnosis of SARS-CoV-2 by FDA under an Emergency Use Authorization (EUA). This EUA will remain  in effect (meaning this test can be used) for the duration of the COVID-19 declaration under Se ction 564(b)(1) of the Act, 21 U.S.C. section 360bbb-3(b)(1), unless the authorization is terminated or revoked sooner.  Performed at [facility], [place] [address], [place], [place] [address]   MRSA PCR Screening     Status: None   Collection Time: 05/26/20 12:10 AM   Specimen: Nasal Mucosa; Nasopharyngeal  Result Value Ref Range Status   MRSA by PCR NEGATIVE NEGATIVE Final    Comment:        The GeneXpert MRSA Assay  (FDA approved for NASAL specimens only), is one component of a comprehensive MRSA colonization surveillance program. It is not intended to diagnose MRSA infection nor to guide or monitor treatment for MRSA infections. Performed at [facility], [address] [place], [place] [address]     Coagulation Studies: Recent Labs    05/25/20 2315  LABPROT 19.4*  INR 1.6*    Urinalysis: Recent Labs    05/26/20 0538  COLORURINE AMBER*  LABSPEC 1.039*  PHURINE 5.0  GLUCOSEU NEGATIVE  HGBUR SMALL*  BILIRUBINUR NEGATIVE  KETONESUR NEGATIVE  PROTEINUR 100*  NITRITE NEGATIVE  LEUKOCYTESUR NEGATIVE      Imaging: DG Chest Port 1 View  Result Date: 05/27/2020 CLINICAL DATA:  Dyspnea EXAM: PORTABLE CHEST 1 VIEW COMPARISON:  05/25/2020 FINDINGS: Cardiac shadow is enlarged but stable. Lungs are well aerated bilaterally. Minimal right costophrenic angle blunting is seen. This is stable from the prior exam. No new focal infiltrate is seen. No bony abnormality is noted. IMPRESSION: No change from the prior exam. Electronically Signed   By: Elta Guadeloupe  Lukens M.D.   On: 05/27/2020 15:44   ECHOCARDIOGRAM COMPLETE  Result Date: 05/27/2020    ECHOCARDIOGRAM REPORT   Patient Name:   Curtis Walters Date of Exam: 05/27/2020 Medical Rec #:  [id number]   Height:       73.0 in Accession #:    [id number]  Weight:       137.0 lb Date of Birth:  1950-11-02    BSA:          1.832 m Patient Age:    22 years    BP:           115/86 mmHg Patient Gender: M           HR:           92 bpm. Exam Location:  [facility] Procedure: 2D Echo, Color Doppler, Cardiac Doppler and 3D Echo Indications:     CHF-acute systolic H29.92  History:         Patient has prior history of Echocardiogram examinations, most                  recent 08/25/2019.  Sonographer:     Sherrie Sport RDCS (AE) Referring Phys:  [id number] Arvil Chaco Diagnosing Phys: Kathlyn Sacramento MD IMPRESSIONS  1. Left ventricular ejection fraction, by estimation, is 20  to 25%. The left ventricle has severely decreased function. The left ventricle demonstrates global hypokinesis. The left ventricular internal cavity size was moderately dilated. Left ventricular diastolic parameters are consistent with Grade II diastolic dysfunction (pseudonormalization).  2. Right ventricular systolic function is mildly reduced. The right ventricular size is moderately enlarged.  3. Left atrial size was mildly dilated.  4. Right atrial size was moderately dilated.  5. The mitral valve is abnormal. Moderate to severe mitral valve regurgitation. No evidence of mitral stenosis.  6. Tricuspid valve regurgitation is moderate to severe.  7. The aortic valve is abnormal. Aortic valve regurgitation is moderate. Mild aortic valve sclerosis is present, with no evidence of aortic valve stenosis.  8. The inferior vena cava is normal in size with greater than 50% respiratory variability, suggesting right atrial pressure of 3 mmHg.  9. Rounded opacity in the apex could be an organized thrombus. Consider cardiac MRI for evaluation. FINDINGS  Left Ventricle: Left ventricular ejection fraction, by estimation, is 20 to 25%. The left ventricle has severely decreased function. The left ventricle demonstrates global hypokinesis. The left ventricular internal cavity size was moderately dilated. There is no left ventricular hypertrophy. Left ventricular diastolic parameters are consistent with Grade II diastolic dysfunction (pseudonormalization). Right Ventricle: The right ventricular size is moderately enlarged. No increase in right ventricular wall thickness. Right ventricular systolic function is mildly reduced. Left Atrium: Left atrial size was mildly dilated. Right Atrium: Right atrial size was moderately dilated. Pericardium: There is no evidence of pericardial effusion. Mitral Valve: The mitral valve is abnormal. Mild mitral annular calcification. Moderate to severe mitral valve regurgitation. No evidence of  mitral valve stenosis. Tricuspid Valve: The tricuspid valve is normal in structure. Tricuspid valve regurgitation is moderate to severe. No evidence of tricuspid stenosis. Aortic Valve: The aortic valve is abnormal. Aortic valve regurgitation is moderate. Aortic regurgitation PHT measures 466 msec. Mild aortic valve sclerosis is present, with no evidence of aortic valve stenosis. Aortic valve mean gradient measures 1.0 mmHg. Aortic valve peak gradient measures 2.2 mmHg. Aortic valve area, by VTI measures 2.37 cm. Pulmonic Valve: The pulmonic valve was normal in structure. Pulmonic valve regurgitation is mild. No  evidence of pulmonic stenosis. Aorta: The aortic root is normal in size and structure. Venous: The inferior vena cava is normal in size with greater than 50% respiratory variability, suggesting right atrial pressure of 3 mmHg. IAS/Shunts: No atrial level shunt detected by color flow Doppler.  LEFT VENTRICLE PLAX 2D LVIDd:         6.07 cm      Diastology LVIDs:         5.82 cm      LV e' medial:    3.70 cm/s LV PW:         1.24 cm      LV E/e' medial:  21.3 LV IVS:        1.02 cm      LV e' lateral:   1.52 cm/s LVOT diam:     2.10 cm      LV E/e' lateral: 51.8 LV SV:         24 LV SV Index:   13 LVOT Area:     3.46 cm                              3D Volume EF: LV Volumes (MOD)            3D EF:        27 % LV vol d, MOD A2C: 194.0 ml LV EDV:       262 ml LV vol d, MOD A4C: 202.0 ml LV ESV:       190 ml LV vol s, MOD A2C: 161.0 ml LV SV:        72 ml LV vol s, MOD A4C: 144.0 ml LV SV MOD A2C:     33.0 ml LV SV MOD A4C:     202.0 ml LV SV MOD BP:      50.0 ml LEFT ATRIUM         Index LA diam:    3.50 cm 1.91 cm/m  AORTIC VALVE                   PULMONIC VALVE AV Area (Vmax):    2.09 cm    PV Vmax:        0.20 m/s AV Area (Vmean):   1.79 cm    PV Peak grad:   0.2 mmHg AV Area (VTI):     2.37 cm    RVOT Peak grad: 0 mmHg AV Vmax:           74.60 cm/s AV Vmean:          55.600 cm/s AV VTI:            0.100  m AV Peak Grad:      2.2 mmHg AV Mean Grad:      1.0 mmHg LVOT Vmax:         45.00 cm/s LVOT Vmean:        28.700 cm/s LVOT VTI:          0.068 m LVOT/AV VTI ratio: 0.68 AI PHT:            466 msec  AORTA Ao Root diam: 3.73 cm MITRAL VALVE               TRICUSPID VALVE MV Area (PHT): 4.44 cm    TR Peak grad:   14.4 mmHg MV Decel Time: 171 msec    TR Vmax:  190.00 cm/s MV E velocity: 78.80 cm/s                            SHUNTS                            Systemic VTI:  0.07 m                            Systemic Diam: 2.10 cm Kathlyn Sacramento MD Electronically signed by Kathlyn Sacramento MD Signature Date/Time: 05/27/2020/11:44:21 AM    Final       Assessment & Plan: Curtis Walters is a 70 y.o.  male with , who was admitted to [facility] on 05/25/2020 for  acute on chronic CHF. Precious medical concerns include chronic back pain, CAD, AAA, reduced EF heart failure, tobacco abuse, and hypertension. He presented to the ED with shortness of breath.    1. Acute Kidney Injury likely due to ATN secondary to IV contrast exposure with baseline creatinine 1.31 on 09/28/19.  IV contrast exposure on 05/25/20  No indication for dialysis at this time Avoid nephrotoxic agents, if possible Will continue to monitor renal function   2. Hypotension   Home regimen includes Metoprolol, held Milrinone gtt     LOS: 3   5/17/202212:48 PM

## 2020-05-28 NOTE — Progress Notes (Signed)
PT Cancellation Note  Patient Details Name: Lucciano Vitali MRN: [id number] DOB: [birth year]   Cancelled Treatment:    Reason Eval/Treat Not Completed: Medical issues which prohibited therapy (Per chart review, patient noted with transfer to CCU due to change in medical status. Per guidelines, will require new orders to resume PT services. Please re-consult as medically appropriate.)  Manav Pierotti H. Manson Passey, PT, DPT, NCS 05/28/20, 8:29 AM [phone]

## 2020-05-28 NOTE — Procedures (Signed)
Central Venous Catheter Insertion Procedure Note  Cotey Rakes  [id number]  03-06-1950  Date:05/28/20  Time:12:12 PM   Provider Performing:Marinus Eicher D Elvina Sidle   Procedure: Insertion of Non-tunneled Central Venous [phone]) with US guidance ([id number])   Indication(s) Medication administration and Difficult access  Consent Risks of the procedure as well as the alternatives and risks of each were explained to the patient and/or caregiver.  Consent for the procedure was obtained and is signed in the bedside chart  Anesthesia Topical only with 1% lidocaine   Timeout Verified patient identification, verified procedure, site/side was marked, verified correct patient position, special equipment/implants available, medications/allergies/relevant history reviewed, required imaging and test results available.  Sterile Technique Maximal sterile technique including full sterile barrier drape, hand hygiene, sterile gown, sterile gloves, mask, hair covering, sterile ultrasound probe cover (if used).  Procedure Description Area of catheter insertion was cleaned with chlorhexidine and draped in sterile fashion.  With real-time ultrasound guidance a central venous catheter was placed into the right internal jugular vein. Nonpulsatile blood flow and easy flushing noted in all ports.  The catheter was sutured in place and sterile dressing applied.  Complications/Tolerance None; patient tolerated the procedure well. Chest X-ray is ordered to verify placement for internal jugular or subclavian cannulation.   Chest x-ray is not ordered for femoral cannulation.  EBL Minimal  Specimen(s) None   Line was secured at the 18 cm mark.  Site with slow persistent oozing (pt previously on heparin gtt, thrombocytopenia, and liver disease).  Surgicel gauze place at insertion site with much noted improvement in oozing.  BIOPATCH placed to site.   Harlon Ditty, AGACNP-BC [facility] Prefer epic messenger for cross cover needs If after hours, please call E-link

## 2020-05-28 NOTE — Progress Notes (Addendum)
1230 Medicated for agitation while inserting right IJ TLC. 1620  Also agitated and trying to get out of bed. Unable to reason with patients. Bilateral mitts appled without success. Medicated for agitation.

## 2020-05-28 NOTE — Consult Note (Signed)
ANTICOAGULATION CONSULT NOTE  Pharmacy Consult for Heparin gtt Indication: LV Apical Thrombus  Allergies  Allergen Reactions  . Penicillins Other (See Comments)    Did it involve swelling of the face/tongue/throat, SOB, or low BP? No Did it involve sudden or severe rash/hives, skin peeling, or any reaction on the inside of your mouth or nose? No Did you need to seek medical attention at a hospital or doctor's office? No When did it last happen? If all above answers are "NO", may proceed with cephalosporin use.    Patient Measurements: Height: 6\' 1"  (185.4 cm) Weight: 64 kg (141 lb 1.5 oz) IBW/kg (Calculated) : 79.9 Heparin Dosing Weight: 62.1 kg  Vital Signs: Temp: 97.4 F (36.3 C) (05/17 0700) Temp Source: Axillary (05/17 0700) BP: 113/97 (05/17 1100) Pulse Rate: 100 (05/17 1000)  Labs: Recent Labs    05/25/20 1624 05/25/20 1754 05/25/20 2217 05/25/20 2315 05/26/20 0554 05/27/20 0412 05/27/20 1544 05/28/20 0155 05/28/20 1012  HGB  --   --   --   --  13.3 13.4  --  12.1*  --   HCT  --   --   --   --  39.3 39.3  --  34.8*  --   PLT  --   --   --   --  67* 59*  --  54*  --   APTT  --   --   --   --   --   --  31  --   --   LABPROT  --   --   --  19.4*  --   --   --   --   --   INR  --   --   --  1.6*  --   --   --   --   --   HEPARINUNFRC  --   --   --   --   --   --   --  0.67 0.63  CREATININE  --   --   --   --  2.15* 2.69*  --   --  3.18*  CKTOTAL  --   --   --   --  96  --   --   --   --   TROPONINIHS 79* 80* 67*  --   --   --   --   --   --     Estimated Creatinine Clearance: 19.3 mL/min (A) (by C-G formula based on SCr of 3.18 mg/dL (H)).   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  Pt's EF noted 20-25% on ECHO and LV Apical Thrombus noted. Patient with  acute on chronic HFrEF/NICM complicated by cardiorenal syndrome. Milrinone infusion started and transfered to CCU. Pharmacy consulted for the management of heparin.  Thrombocytopenia at baseline.     Goal of Therapy:  Heparin level 0.3-0.7 units/ml Monitor platelets by anticoagulation protocol: Yes    Plan:  Heparin level therapeutic x 2 Continue heparin at 1000 units/hr. Heparin drip held for a few hours for placement of central line. Check HL and CBC with morning labs.   70yo, PharmD Clinical Pharmacist 05/28/2020 11:45 AM

## 2020-05-28 NOTE — Consult Note (Signed)
NAME:  Curtis Walters, MRN:  [id number], DOB:  [birth year], LOS: 3 ADMISSION DATE:  05/25/2020, CONSULTATION DATE:  05/28/2020 REFERRING MD:  Dr. Okey Dupre, CHIEF COMPLAINT:  Shortness of breath  Brief Pt Description/Synopsis:  70 year old male with past medical history significant for nonischemic cardiomyopathy, EF of 20%, mild pulmonary hypertension, moderate MR/TR, mild to moderate AAS, alcohol abuse, alcoholic liver disease, tobacco abuse, chronic back pain presented to the ED with dyspnea.  Found to have Acute on Chronic HFrEF, AKI on CKD Stage III and suspected Cardiorenal Syndrome now requiring Milrinone infusion.  History of Present Illness:  Curtis Walters is a 70 year old male with a significant past medical history as listed below who presented to [facility] ED on 05/25/2020 due to complaints of shortness of breath. In the emergency room labs were notable for creatinine of 2.0, mildly elevated LFTs, platelet of 75, lactate of 4.6, BNP greater than 4500, CTA without PE, noted ascites, hepatic steatosis, emphysema, moderate right pleural effusion  He was admitted to the MedSurg unit by the hospitalist for further work-up and treatment of acute decompensated HFrEF (EF 20-25%) and AKI on CKD 3.  Trial of IV diuresis was attempted, however was stopped due to worsening renal failure.  Concern for Cardiorenal syndrome.  He was transferred to ICU on 05/27/20 by Cardiology for initiation of Milrinone infusion.  Central line placed on 5/17/212.  PCCM, Nephrology, and Palliative Care have been consulted for assistance in management.  Pertinent  Medical History  nonischemic cardiomyopathy HFrEF (EF of 20%) Coronary Artery Disease Pulmonary hypertension Moderate Mitral & Tricuspid Valve Regurgitation Hypertension Dilated aortic root alcohol abuse alcoholic liver disease tobacco abuse chronic back pain   Significant Hospital Events: Including procedures, antibiotic start and stop dates in addition to other  pertinent events   . 05/25/20: Admitted to Hospital by Hospitalist . 05/27/20: Concern for Cardiorenal syndrome, transferred to ICU for Milrinone infusion . 05/28/20: PCCM consulted for central line placement.  Palliative Care and Nephrology consulted  Interim History / Subjective:  -Transferred to ICU yesterday for initiation of Milrinone gtt, concern for Cardiorenal syndrome -PCCM asked to place Central line today -Nephrology and Palliative Care being consulted -Afebrile, hemodynamically stable, no vasopressors, on 3L supplemental O2 via [place] -Urine output 250 last 24 hrs   Objective   Blood pressure (!) 110/92, pulse 94, temperature (!) 97.4 F (36.3 C), temperature source Axillary, resp. rate 12, height 6\' 1"  (1.854 m), weight 64 kg, SpO2 100 %.        Intake/Output Summary (Last 24 hours) at 05/28/2020 0947 Last data filed at 05/28/2020 0600 Gross per 24 hour  Intake 182.43 ml  Output 250 ml  Net -67.57 ml   Filed Weights   05/26/20 2040 05/27/20 0450 05/28/20 0440  Weight: 62.1 kg 62.1 kg 64 kg    Examination: General: Acute on chronically ill appearing male, laying in bed, on 3L [place], in NAD HENT: Atraumatic, normocephalic, neck supple, + JVD Lungs: Coarse breath sounds bilaterally, even, nonlabored Cardiovascular: Regular rate and rhythm, tachycardia, 2/6 systolic murmur, no rubs or gallops, 2+ distal pulses Abdomen: Soft, nontender, nondistended, no guarding or rebound tenderness, BS+ x4 Extremities: Normal bulk and tone, no deformities, no edema Neuro: Lethargic post pain medications, arouses to voice, purposeful movement to all extremities but will not follow commands, no focal deficits noted GU: External catheter in place  Labs/imaging that I havepersonally reviewed  (right click and "Reselect all SmartList Selections" daily)  Labs 05/28/2020: Bicarbonate 21, BUN 55, creatinine 3.18, WBC  9.2, hemoglobin 12.1, hematocrit 34.8, platelets 54 Chest x-ray 05/28/2020:The right  IJ central venous catheter tip is in the mid/distal SVC near the level of the carina. No complicating features.Stable cardiac enlargement, interstitial edema and right pleural effusion.  Resolved Hospital Problem list     Assessment & Plan:   Acute on Chronic HFrEF (LVEF 20-25%) Left Ventricular Apical Thrombus Mildly elevated Troponin, suspect demand ischemia -Continuous cardiac monitoring -Maintain MAP greater than 65 -Cardiology following, appreciate input -HS Troponin peaked at 80 -Continue milrinone infusion -Continue heparin drip -Diuresis as blood pressure and renal function permits -Repeat Echocardiogram on 05/27/20 with LVEF 20-25%, Grade II diastolic dysfunction, RV systolic function mildly reduced, moderate to severe MR, moderate to severe TR, concern for thrombus   AKI on CKD Stage III Suspect Cardiorenal Syndrome -Monitor I&O's / urinary output -Follow BMP -Ensure adequate renal perfusion -Avoid nephrotoxic agents as able -Replace electrolytes as indicated -Continue Milrinone infusion -Nephrology consulted, appreciate input  Acute Hypoxic Respiratory Failure in setting of HFrEF, Pulmonary Artery Hypertension, Small Right Pleural Effusion -Supplemental O2 as needed to maintain O2 sats greater than 90% -Follow intermittent chest x-ray and ABG as needed -Diuresis as blood pressure and renal function permits -As needed bronchodilators -Encourage pulmonary hygiene  Alcoholic Liver Disease Superimposed Hepatic congestion Ascites -Follow LFTs -Continue thiamine -High risk for DTs, continue CIWA protocol  Thrombocytopenia, suspect secondary to chronic liver disease & Alcoholism -Monitor for S/Sx of bleeding -Trend CBC -Heparin drip for Anticoagulation/VTE Prophylaxis (for LV apical thrombus)  -Transfuse for Hgb <7 -Transfuse platelets for platelet count less than 50 and active bleeding    Patient is critically ill with multiorgan failure.  Prognosis is very  guarded, and long-term prognosis is poor.  High risk for cardiac arrest and death.  Recommend DNR/DNI status.  Palliative care consulted.   Best practice (right click and "Reselect all SmartList Selections" daily)  Diet:  Oral Pain/Anxiety/Delirium protocol (if indicated): No VAP protocol (if indicated): Not indicated DVT prophylaxis: Systemic AC GI prophylaxis: PPI Glucose control:  SSI No Central venous access: central line placed for Milrinone Arterial line:  N/A Foley:  N/A Mobility:  bed rest  PT consulted: Yes Last date of multidisciplinary goals of care discussion [05/28/2020] Code Status:  full code Disposition: ICU  Updated pt's fianc Girtha Rm at bedside 05/28/20.  All questions answered.  Labs   CBC: Recent Labs  Lab 05/25/20 1146 05/26/20 0554 05/27/20 0412 05/28/20 0155  WBC 6.0 3.1* 8.3 9.2  NEUTROABS 3.9  --   --   --   HGB 13.9 13.3 13.4 12.1*  HCT 41.5 39.3 39.3 34.8*  MCV 96.7 96.1 97.3 96.7  PLT 75* 67* 59* 54*    Basic Metabolic Panel: Recent Labs  Lab 05/25/20 1146 05/25/20 2315 05/26/20 0554 05/27/20 0412 05/28/20 0155  NA 138  --  139 136  --   K 3.7  --  4.0 4.6  --   CL 105  --  107 105  --   CO2 19*  --  20* 19*  --   GLUCOSE 87  --  203* 133*  --   BUN 41*  --  42* 45*  --   CREATININE 2.03*  --  2.15* 2.69*  --   CALCIUM 9.2  --  8.9 8.7*  --   MG  --  1.8  --   --  1.8  PHOS  --   --   --   --  4.2   GFR:  Estimated Creatinine Clearance: 22.8 mL/min (A) (by C-G formula based on SCr of 2.69 mg/dL (H)). Recent Labs  Lab 05/25/20 1146 05/25/20 1343 05/25/20 1927 05/25/20 1946 05/25/20 2315 05/26/20 0554 05/27/20 0412 05/28/20 0155  PROCALCITON  --   --   --  0.20  --   --   --   --   WBC 6.0  --   --   --   --  3.1* 8.3 9.2  LATICACIDVEN 4.6* 4.0* 5.9*  --  4.5*  --   --   --     Liver Function Tests: Recent Labs  Lab 05/25/20 1146 05/26/20 0554  AST 85* 84*  ALT 60* 60*  ALKPHOS 78 74  BILITOT 4.1* 3.7*   PROT 6.9 6.2*  ALBUMIN 3.2* 3.0*   No results for input(s): LIPASE, AMYLASE in the last 168 hours. No results for input(s): AMMONIA in the last 168 hours.  ABG    Component Value Date/Time   PHART 7.46 (H) 05/25/2020 2247   PCO2ART 30 (L) 05/25/2020 2247   PO2ART 85 05/25/2020 2247   HCO3 21.3 05/25/2020 2247   ACIDBASEDEF 1.5 05/25/2020 2247   O2SAT 97.0 05/25/2020 2247     Coagulation Profile: Recent Labs  Lab 05/25/20 2315  INR 1.6*    Cardiac Enzymes: Recent Labs  Lab 05/26/20 0554  CKTOTAL 96    HbA1C: Hgb A1c MFr Bld  Date/Time Value Ref Range Status  05/26/2020 05:54 AM 5.3 4.8 - 5.6 % Final    Comment:    (NOTE) Pre diabetes:          5.7%-6.4%  Diabetes:              >6.4%  Glycemic control for   <7.0% adults with diabetes   08/26/2019 06:07 AM 4.9 4.8 - 5.6 % Final    Comment:    (NOTE) Pre diabetes:          5.7%-6.4%  Diabetes:              >6.4%  Glycemic control for   <7.0% adults with diabetes     CBG: Recent Labs  Lab 05/26/20 0004 05/27/20 1632 05/27/20 1930 05/27/20 2309 05/28/20 0319  GLUCAP 198* 99 93 110* 83    Review of Systems:   Unable to assess due to AMS  Past Medical History:  He,  has a past medical history of Alcohol use, Chronic back pain, Coronary artery disease, non-occlusive, Dilated aortic root (HCC), HFrEF (heart failure with reduced ejection fraction) (HCC), Hypertension, NICM (nonischemic cardiomyopathy) (HCC), Tobacco abuse, and Transaminitis.   Surgical History:   Past Surgical History:  Procedure Laterality Date  . RIGHT AND LEFT HEART CATH N/A 08/28/2019   Procedure: RIGHT AND LEFT HEART CATH with possible percutaneous intervention;  Surgeon: Iran Ouch, MD;  Location: [facility] INVASIVE CV LAB;  Service: Cardiovascular;  Laterality: N/A;     Social History:   reports that he has been smoking cigarettes. He has never used smokeless tobacco. He reports current alcohol use of about 35.0  standard drinks of alcohol per week. He reports previous drug use.   Family History:  His family history includes Sickle cell anemia in his sister.   Allergies Allergies  Allergen Reactions  . Penicillins Other (See Comments)    Did it involve swelling of the face/tongue/throat, SOB, or low BP? No Did it involve sudden or severe rash/hives, skin peeling, or any reaction on the inside of your mouth or nose? No  Did you need to seek medical attention at a hospital or doctor's office? No When did it last happen? If all above answers are "NO", may proceed with cephalosporin use.     Home Medications  Prior to Admission medications   Medication Sig Start Date End Date Taking? Authorizing Provider  metoprolol succinate (TOPROL XL) 25 MG 24 hr tablet Take 1 tablet (25 mg total) by mouth daily. Patient not taking: Reported on 05/25/2020 04/20/20 04/20/21  Jene Every, MD  torsemide (DEMADEX) 20 MG tablet Take 2 tablets (40 mg total) by mouth daily. 04/20/20 07/19/20  Jene Every, MD     Critical care time: 50 minutes    Harlon Ditty, AGACNP-BC [facility] Prefer epic messenger for cross cover needs If after hours, please call E-link

## 2020-05-28 NOTE — Progress Notes (Addendum)
PROGRESS NOTE    Curtis Walters  NUU:[id number] DOB: [birth year] DOA: 05/25/2020 PCP: Patient, No Pcp Per (Inactive)  Brief Narrative: 70/M with history of nonischemic cardiomyopathy, EF of 20%, mild pulmonary hypertension, moderate MR/TR, mild to moderate AAS, alcohol abuse, alcoholic liver disease, tobacco abuse, chronic back pain presented to the ED with dyspnea.,  He is a very poor historian with significant cognitive and memory deficits as well. -In the emergency room labs were notable for creatinine of 2.0, mildly elevated LFTs, platelet of 75, lactate of 4.6, BNP greater than 4500, CTA without PE, noted ascites, hepatic steatosis, emphysema, moderate right pleural effusion. -Echo with severe cardiomyopathy, suspected apical thrombus, diuresis attempted, had worsening creatinine, with suspected low output state with cardiorenal syndrome, transferred to ICU, started on milrinone -Palliative care consulted  Assessment & Plan:   Acute on chronic systolic CHF Low output heart failure, cardiorenal syndrome Moderate MR, pulmonary hypertension, moderate TR Right pleural effusion, Ascites -History of nonischemic cardiomyopathy, EF of 20 -25%, noncompliance with diuretics, likely diet as well and unfortunately his chronic liver disease also contributing to volume overload -Was given a dose of IV Lasix on admission, diuretics held subsequently due to worsening creatinine and a low output state -Transferred to ICU and started on milrinone drip 5/16, defer diuretics to cardiology -Overall prognosis is poor, will also request palliative care consultation  AKI on CKD 3 -Baseline creatinine around 1.4 -with worsening up to 3.1 today, monitor urine output closely -Diuretics on hold  Suspected Apical thrombus -Continue IV heparin, will need TEE when more stable  Alcoholic liver disease Passive hepatic congestion also likely contributing to liver damage -Counseled regarding alcoholism -Continue  thiamine, monitor for withdrawal  Tachycardia, PVCs -Restart Coreg at a lower dose  Memory, significant cognitive deficits -TSH is normal, B12 level is high  Hyperglycemia -Hemoglobin A 1C is 5.3  Thrombocytopenia -Secondary to chronic liver disease, alcoholism -Monitor platelet count closely while on IV heparin  Tobacco abuse -Counseled  Moderate protein calorie malnutrition -Supplements as tolerated  DVT prophylaxis: IV heparin for suspected LV thrombus Code Status: Full code Family Communication: No family at bedside, called and updated fianc Girtha Rm yesterday, left message today Disposition Plan:  Status is: Inpatient  Remains inpatient appropriate because:Inpatient level of care appropriate due to severity of illness   Dispo: The patient is from: Home              Anticipated d/c is to: will likely need SNF              Patient currently is not medically stable to d/c.   Difficult to place patient No  Consultants:   Cardiology   Procedures:   Antimicrobials:    Subjective: -Pleasant but somewhat disoriented, reports some shortness of breath  Objective: Vitals:   05/28/20 0700 05/28/20 0900 05/28/20 1000 05/28/20 1100  BP:  (!) 114/98 (!) 110/55 (!) 113/97  Pulse:  (!) 105 100   Resp:  15 13 (!) 25  Temp: (!) 97.4 F (36.3 C)     TempSrc: Axillary     SpO2:  100% 96% 98%  Weight:      Height:        Intake/Output Summary (Last 24 hours) at 05/28/2020 1445 Last data filed at 05/28/2020 1418 Gross per 24 hour  Intake 182.43 ml  Output 250 ml  Net -67.57 ml   Filed Weights   05/26/20 2040 05/27/20 0450 05/28/20 0440  Weight: 62.1 kg 62.1 kg 64 kg  Examination:  General exam: Chronically ill frail male laying in bed, awake alert, memory deficits noted HEENT: Positive JVD CVS: S1-S2, regular rhythm, tachycardic, S3 noted, systolic murmur Lungs: Decreased breath sounds at both bases  Abdomen: Soft, nontender, bowel sounds  present Extremities: Trace edema, warm extremities Skin: No rashes on exposed skin Psychiatry: Flat affect, poor insight and judgment  Data Reviewed:   CBC: Recent Labs  Lab 05/25/20 1146 05/26/20 0554 05/27/20 0412 05/28/20 0155  WBC 6.0 3.1* 8.3 9.2  NEUTROABS 3.9  --   --   --   HGB 13.9 13.3 13.4 12.1*  HCT 41.5 39.3 39.3 34.8*  MCV 96.7 96.1 97.3 96.7  PLT 75* 67* 59* 54*   Basic Metabolic Panel: Recent Labs  Lab 05/25/20 1146 05/25/20 2315 05/26/20 0554 05/27/20 0412 05/28/20 0155 05/28/20 1012  NA 138  --  139 136  --  136  K 3.7  --  4.0 4.6  --  4.7  CL 105  --  107 105  --  105  CO2 19*  --  20* 19*  --  21*  GLUCOSE 87  --  203* 133*  --  73  BUN 41*  --  42* 45*  --  55*  CREATININE 2.03*  --  2.15* 2.69*  --  3.18*  CALCIUM 9.2  --  8.9 8.7*  --  8.5*  MG  --  1.8  --   --  1.8  --   PHOS  --   --   --   --  4.2  --    GFR: Estimated Creatinine Clearance: 19.3 mL/min (A) (by C-G formula based on SCr of 3.18 mg/dL (H)). Liver Function Tests: Recent Labs  Lab 05/25/20 1146 05/26/20 0554  AST 85* 84*  ALT 60* 60*  ALKPHOS 78 74  BILITOT 4.1* 3.7*  PROT 6.9 6.2*  ALBUMIN 3.2* 3.0*   No results for input(s): LIPASE, AMYLASE in the last 168 hours. No results for input(s): AMMONIA in the last 168 hours. Coagulation Profile: Recent Labs  Lab 05/25/20 2315  INR 1.6*   Cardiac Enzymes: Recent Labs  Lab 05/26/20 0554  CKTOTAL 96   BNP (last 3 results) No results for input(s): PROBNP in the last 8760 hours. HbA1C: Recent Labs    05/26/20 0554  HGBA1C 5.3   CBG: Recent Labs  Lab 05/26/20 0004 05/27/20 1632 05/27/20 1930 05/27/20 2309 05/28/20 0319  GLUCAP 198* 99 93 110* 83   Lipid Profile: No results for input(s): CHOL, HDL, LDLCALC, TRIG, CHOLHDL, LDLDIRECT in the last 72 hours. Thyroid Function Tests: Recent Labs    05/26/20 0554  TSH 2.375   Anemia Panel: Recent Labs    05/25/20 2315  VITAMINB12 1,487*   Urine  analysis:    Component Value Date/Time   COLORURINE AMBER (A) 05/26/2020 0538   APPEARANCEUR CLEAR (A) 05/26/2020 0538   LABSPEC 1.039 (H) 05/26/2020 0538   PHURINE 5.0 05/26/2020 0538   GLUCOSEU NEGATIVE 05/26/2020 0538   HGBUR SMALL (A) 05/26/2020 0538   BILIRUBINUR NEGATIVE 05/26/2020 0538   KETONESUR NEGATIVE 05/26/2020 0538   PROTEINUR 100 (A) 05/26/2020 0538   NITRITE NEGATIVE 05/26/2020 0538   LEUKOCYTESUR NEGATIVE 05/26/2020 0538   Sepsis Labs: @LABRCNTIP (procalcitonin:4,lacticidven:4)  ) Recent Results (from the past 240 hour(s))  Blood culture (single)     Status: None (Preliminary result)   Collection Time: 05/25/20  3:16 PM   Specimen: BLOOD  Result Value Ref Range Status   Specimen Description  Final    BLOOD Blood Culture adequate volume Performed at [facility], [address] [place], [place] [address]    Special Requests   Final    BOTTLES DRAWN AEROBIC AND ANAEROBIC BLOOD RIGHT FOREARM Performed at [facility], [address] [place], [place] [address]    Culture  Setup Time   Final    GRAM POSITIVE COCCI AEROBIC BOTTLE ONLY CRITICAL RESULT CALLED TO, READ BACK BY AND VERIFIED WITH: JASON ROBBINS @[id number]  05/27/20    Culture   Final    GRAM POSITIVE COCCI IDENTIFICATION TO FOLLOW Performed at [facility], [address] [place], [place] [address]    Report Status PENDING  Incomplete  Blood Culture ID Panel (Reflexed)     Status: None   Collection Time: 05/25/20  3:16 PM  Result Value Ref Range Status   Enterococcus faecalis NOT DETECTED NOT DETECTED Final   Enterococcus Faecium NOT DETECTED NOT DETECTED Final   Listeria monocytogenes NOT DETECTED NOT DETECTED Final   Staphylococcus species NOT DETECTED NOT DETECTED Final   Staphylococcus aureus (BCID) NOT DETECTED NOT DETECTED Final   Staphylococcus epidermidis NOT DETECTED NOT DETECTED Final   Staphylococcus lugdunensis NOT DETECTED NOT DETECTED Final    Streptococcus species NOT DETECTED NOT DETECTED Final   Streptococcus agalactiae NOT DETECTED NOT DETECTED Final   Streptococcus pneumoniae NOT DETECTED NOT DETECTED Final   Streptococcus pyogenes NOT DETECTED NOT DETECTED Final   A.calcoaceticus-baumannii NOT DETECTED NOT DETECTED Final   Bacteroides fragilis NOT DETECTED NOT DETECTED Final   Enterobacterales NOT DETECTED NOT DETECTED Final   Enterobacter cloacae complex NOT DETECTED NOT DETECTED Final   Escherichia coli NOT DETECTED NOT DETECTED Final   Klebsiella aerogenes NOT DETECTED NOT DETECTED Final   Klebsiella oxytoca NOT DETECTED NOT DETECTED Final   Klebsiella pneumoniae NOT DETECTED NOT DETECTED Final   Proteus species NOT DETECTED NOT DETECTED Final   Salmonella species NOT DETECTED NOT DETECTED Final   Serratia marcescens NOT DETECTED NOT DETECTED Final   Haemophilus influenzae NOT DETECTED NOT DETECTED Final   Neisseria meningitidis NOT DETECTED NOT DETECTED Final   Pseudomonas aeruginosa NOT DETECTED NOT DETECTED Final   Stenotrophomonas maltophilia NOT DETECTED NOT DETECTED Final   Candida albicans NOT DETECTED NOT DETECTED Final   Candida auris NOT DETECTED NOT DETECTED Final   Candida glabrata NOT DETECTED NOT DETECTED Final   Candida krusei NOT DETECTED NOT DETECTED Final   Candida parapsilosis NOT DETECTED NOT DETECTED Final   Candida tropicalis NOT DETECTED NOT DETECTED Final   Cryptococcus neoformans/gattii NOT DETECTED NOT DETECTED Final    Comment: Performed at [facility], [address] [place], [place] [address]  SARS CORONAVIRUS 2 (TAT 6-24 HRS) Nasopharyngeal Nasopharyngeal Swab     Status: None   Collection Time: 05/25/20  3:21 PM   Specimen: Nasopharyngeal Swab  Result Value Ref Range Status   SARS Coronavirus 2 NEGATIVE NEGATIVE Final    Comment: (NOTE) SARS-CoV-2 target nucleic acids are NOT DETECTED.  The SARS-CoV-2 RNA is generally detectable in upper and lower respiratory specimens  during the acute phase of infection. Negative results do not preclude SARS-CoV-2 infection, do not rule out co-infections with other pathogens, and should not be used as the sole basis for treatment or other patient management decisions. Negative results must be combined with clinical observations, patient history, and epidemiological information. The expected result is Negative.  Fact Sheet for Patients: HairSlick.nohttps://www.fda.gov/media/138098/download  Fact Sheet for Healthcare Providers: quierodirigir.comhttps://www.fda.gov/media/138095/download  This test is not yet approved or cleared by the [place] and  has been authorized for detection and/or diagnosis of SARS-CoV-2 by FDA under an Emergency Use Authorization (EUA). This EUA will remain  in effect (meaning this test can be used) for the duration of the COVID-19 declaration under Se ction 564(b)(1) of the Act, 21 U.S.C. section 360bbb-3(b)(1), unless the authorization is terminated or revoked sooner.  Performed at [facility], [address] [place], [place] [address]   MRSA PCR Screening     Status: None   Collection Time: 05/26/20 12:10 AM   Specimen: Nasal Mucosa; Nasopharyngeal  Result Value Ref Range Status   MRSA by PCR NEGATIVE NEGATIVE Final    Comment:        The GeneXpert MRSA Assay (FDA approved for NASAL specimens only), is one component of a comprehensive MRSA colonization surveillance program. It is not intended to diagnose MRSA infection nor to guide or monitor treatment for MRSA infections. Performed at [facility], [address], [place], [place] [address]          Radiology Studies: DG Chest [place] 1 View  Result Date: 05/28/2020 CLINICAL DATA:  Central line placement. EXAM: PORTABLE CHEST 1 VIEW COMPARISON:  Chest x-ray from yesterday. FINDINGS: The right IJ central venous catheter tip is in the mid/distal SVC near the level of the carina. No complicating features. Stable cardiac  enlargement, interstitial edema and right pleural effusion. IMPRESSION: Right IJ central venous catheter in good position without complicating features. Electronically Signed   By: Rudie Meyer M.D.   On: 05/28/2020 12:51   DG Chest Port 1 View  Result Date: 05/27/2020 CLINICAL DATA:  Dyspnea EXAM: PORTABLE CHEST 1 VIEW COMPARISON:  05/25/2020 FINDINGS: Cardiac shadow is enlarged but stable. Lungs are well aerated bilaterally. Minimal right costophrenic angle blunting is seen. This is stable from the prior exam. No new focal infiltrate is seen. No bony abnormality is noted. IMPRESSION: No change from the prior exam. Electronically Signed   By: Alcide Clever M.D.   On: 05/27/2020 15:44   ECHOCARDIOGRAM COMPLETE  Result Date: 05/27/2020    ECHOCARDIOGRAM REPORT   Patient Name:   Curtis Walters Date of Exam: 05/27/2020 Medical Rec #:  [id number]   Height:       73.0 in Accession #:    [id number]  Weight:       137.0 lb Date of Birth:  [birth year]    BSA:          1.832 m Patient Age:    70 years    BP:           115/86 mmHg Patient Gender: M           HR:           92 bpm. Exam Location:  [facility] Procedure: 2D Echo, Color Doppler, Cardiac Doppler and 3D Echo Indications:     CHF-acute systolic I50.21  History:         Patient has prior history of Echocardiogram examinations, most                  recent 08/25/2019.  Sonographer:     Cristela Blue RDCS (AE) Referring Phys:  [id number] Lennon Alstrom Diagnosing Phys: Lorine Bears MD IMPRESSIONS  1. Left ventricular ejection fraction, by estimation, is 20 to 25%. The left ventricle has severely decreased function. The left ventricle demonstrates global hypokinesis. The left ventricular internal cavity size was moderately  dilated. Left ventricular diastolic parameters are consistent with Grade II diastolic dysfunction (pseudonormalization).  2. Right ventricular systolic function is mildly reduced. The right ventricular size is moderately enlarged.  3. Left atrial size was  mildly dilated.  4. Right atrial size was moderately dilated.  5. The mitral valve is abnormal. Moderate to severe mitral valve regurgitation. No evidence of mitral stenosis.  6. Tricuspid valve regurgitation is moderate to severe.  7. The aortic valve is abnormal. Aortic valve regurgitation is moderate. Mild aortic valve sclerosis is present, with no evidence of aortic valve stenosis.  8. The inferior vena cava is normal in size with greater than 50% respiratory variability, suggesting right atrial pressure of 3 mmHg.  9. Rounded opacity in the apex could be an organized thrombus. Consider cardiac MRI for evaluation. FINDINGS  Left Ventricle: Left ventricular ejection fraction, by estimation, is 20 to 25%. The left ventricle has severely decreased function. The left ventricle demonstrates global hypokinesis. The left ventricular internal cavity size was moderately dilated. There is no left ventricular hypertrophy. Left ventricular diastolic parameters are consistent with Grade II diastolic dysfunction (pseudonormalization). Right Ventricle: The right ventricular size is moderately enlarged. No increase in right ventricular wall thickness. Right ventricular systolic function is mildly reduced. Left Atrium: Left atrial size was mildly dilated. Right Atrium: Right atrial size was moderately dilated. Pericardium: There is no evidence of pericardial effusion. Mitral Valve: The mitral valve is abnormal. Mild mitral annular calcification. Moderate to severe mitral valve regurgitation. No evidence of mitral valve stenosis. Tricuspid Valve: The tricuspid valve is normal in structure. Tricuspid valve regurgitation is moderate to severe. No evidence of tricuspid stenosis. Aortic Valve: The aortic valve is abnormal. Aortic valve regurgitation is moderate. Aortic regurgitation PHT measures 466 msec. Mild aortic valve sclerosis is present, with no evidence of aortic valve stenosis. Aortic valve mean gradient measures 1.0 mmHg.  Aortic valve peak gradient measures 2.2 mmHg. Aortic valve area, by VTI measures 2.37 cm. Pulmonic Valve: The pulmonic valve was normal in structure. Pulmonic valve regurgitation is mild. No evidence of pulmonic stenosis. Aorta: The aortic root is normal in size and structure. Venous: The inferior vena cava is normal in size with greater than 50% respiratory variability, suggesting right atrial pressure of 3 mmHg. IAS/Shunts: No atrial level shunt detected by color flow Doppler.  LEFT VENTRICLE PLAX 2D LVIDd:         6.07 cm      Diastology LVIDs:         5.82 cm      LV e' medial:    3.70 cm/s LV PW:         1.24 cm      LV E/e' medial:  21.3 LV IVS:        1.02 cm      LV e' lateral:   1.52 cm/s LVOT diam:     2.10 cm      LV E/e' lateral: 51.8 LV SV:         24 LV SV Index:   13 LVOT Area:     3.46 cm                              3D Volume EF: LV Volumes (MOD)            3D EF:        27 % LV vol d, MOD A2C: 194.0 ml LV EDV:  262 ml LV vol d, MOD A4C: 202.0 ml LV ESV:       190 ml LV vol s, MOD A2C: 161.0 ml LV SV:        72 ml LV vol s, MOD A4C: 144.0 ml LV SV MOD A2C:     33.0 ml LV SV MOD A4C:     202.0 ml LV SV MOD BP:      50.0 ml LEFT ATRIUM         Index LA diam:    3.50 cm 1.91 cm/m  AORTIC VALVE                   PULMONIC VALVE AV Area (Vmax):    2.09 cm    PV Vmax:        0.20 m/s AV Area (Vmean):   1.79 cm    PV Peak grad:   0.2 mmHg AV Area (VTI):     2.37 cm    RVOT Peak grad: 0 mmHg AV Vmax:           74.60 cm/s AV Vmean:          55.600 cm/s AV VTI:            0.100 m AV Peak Grad:      2.2 mmHg AV Mean Grad:      1.0 mmHg LVOT Vmax:         45.00 cm/s LVOT Vmean:        28.700 cm/s LVOT VTI:          0.068 m LVOT/AV VTI ratio: 0.68 AI PHT:            466 msec  AORTA Ao Root diam: 3.73 cm MITRAL VALVE               TRICUSPID VALVE MV Area (PHT): 4.44 cm    TR Peak grad:   14.4 mmHg MV Decel Time: 171 msec    TR Vmax:        190.00 cm/s MV E velocity: 78.80 cm/s                             SHUNTS                            Systemic VTI:  0.07 m                            Systemic Diam: 2.10 cm Lorine Bears MD Electronically signed by Lorine Bears MD Signature Date/Time: 05/27/2020/11:44:21 AM    Final     Scheduled Meds: . aspirin EC  81 mg Oral Daily  . Chlorhexidine Gluconate Cloth  6 each Topical Daily  . folic acid  1 mg Oral Daily  . multivitamin with minerals  1 tablet Oral Daily  . thiamine  100 mg Oral Daily   Continuous Infusions: . heparin Stopped (05/28/20 0906)  . milrinone 0.125 mcg/kg/min (05/28/20 0600)     LOS: 3 days    Time spent:  Zannie Cove, MD [facility]  05/28/2020, 2:45 PM

## 2020-05-28 NOTE — Progress Notes (Signed)
Progress Note  Patient Name: Elie Leppo Date of Encounter: 05/28/2020  Primary Cardiologist: Julien Nordmann, MD  Subjective   Somnolent this morning following pain medication.  Will open eyes when asked then goes back to sleep.  BMP initially not drawn this morning though upon reordering demonstrates worsening renal function with a BUN/SCR trending from 45/2.69-55/3.18 over the past 24 hours.  Platelet count remains low though is stable at 54,000.  Inpatient Medications    Scheduled Meds: . aspirin EC  81 mg Oral Daily  . Chlorhexidine Gluconate Cloth  6 each Topical Daily  . folic acid  1 mg Oral Daily  . multivitamin with minerals  1 tablet Oral Daily  . thiamine  100 mg Oral Daily   Continuous Infusions: . heparin Stopped (05/28/20 0906)  . milrinone 0.125 mcg/kg/min (05/28/20 0600)   PRN Meds: acetaminophen **OR** acetaminophen, ALPRAZolam, LORazepam **OR** LORazepam   Vital Signs    Vitals:   05/28/20 0440 05/28/20 0500 05/28/20 0600 05/28/20 0700  BP:  103/81 (!) 110/92   Pulse:  94    Resp:  10 12   Temp:  (!) 97.4 F (36.3 C)  (!) 97.4 F (36.3 C)  TempSrc:  Axillary  Axillary  SpO2:  100%    Weight: 64 kg     Height:        Intake/Output Summary (Last 24 hours) at 05/28/2020 1036 Last data filed at 05/28/2020 0600 Gross per 24 hour  Intake 182.43 ml  Output 250 ml  Net -67.57 ml   Filed Weights   05/26/20 2040 05/27/20 0450 05/28/20 0440  Weight: 62.1 kg 62.1 kg 64 kg    Physical Exam   GEN: Thin, frail in no acute distress.  HEENT: Grossly normal.  Neck: Supple,  JVD elevated to the angle of the mandible, no carotid bruits, or masses. Cardiac: RRR, tachycardic, 2/6 syst murmur left sternal border, no rubs, or gallops. No clubbing, cyanosis, edema.  Radials 2+, DP/PT 2+ and equal bilaterally.  Extremities are warm. Respiratory: Diminished breath sounds along the bilateral bases.   GI: Soft, nontender, nondistended, BS + x 4. MS: no deformity  or atrophy. Skin: warm and dry, no rash. Neuro: Somnolent. Psych: Somnolent.  Labs    Chemistry Recent Labs  Lab 05/25/20 1146 05/26/20 0554 05/27/20 0412 05/28/20 1012  NA 138 139 136 136  K 3.7 4.0 4.6 4.7  CL 105 107 105 105  CO2 19* 20* 19* 21*  GLUCOSE 87 203* 133* 73  BUN 41* 42* 45* 55*  CREATININE 2.03* 2.15* 2.69* 3.18*  CALCIUM 9.2 8.9 8.7* 8.5*  PROT 6.9 6.2*  --   --   ALBUMIN 3.2* 3.0*  --   --   AST 85* 84*  --   --   ALT 60* 60*  --   --   ALKPHOS 78 74  --   --   BILITOT 4.1* 3.7*  --   --   GFRNONAA 34* 32* 25* 20*  ANIONGAP 14 12 12 10      Hematology Recent Labs  Lab 05/26/20 0554 05/27/20 0412 05/28/20 0155  WBC 3.1* 8.3 9.2  RBC 4.09* 4.04* 3.60*  HGB 13.3 13.4 12.1*  HCT 39.3 39.3 34.8*  MCV 96.1 97.3 96.7  MCH 32.5 33.2 33.6  MCHC 33.8 34.1 34.8  RDW 18.6* 18.4* 17.9*  PLT 67* 59* 54*    Cardiac Enzymes  Recent Labs  Lab 05/25/20 1147 05/25/20 1624 05/25/20 1754 05/25/20 2217  TROPONINIHS 64*  79* 80* 67*      BNP Recent Labs  Lab 05/25/20 2315  BNP >4,500.0*    Lipids  Lab Results  Component Value Date   CHOL 192 08/26/2019   HDL 72 08/26/2019   LDLCALC 104 (H) 08/26/2019   TRIG 82 08/26/2019   CHOLHDL 2.7 08/26/2019    HbA1c  Lab Results  Component Value Date   HGBA1C 5.3 05/26/2020    Radiology    DG Chest 2 View  Result Date: 05/25/2020 IMPRESSION: Stable cardiomegaly. Stable RIGHT pleural thickening or pleural effusion. Electronically Signed   By: Norva Pavlov M.D.   On: 05/25/2020 12:16   CT ANGIO CHEST PE W OR WO CONTRAST  Result Date: 05/25/2020 IMPRESSION: 1. No evidence for acute pulmonary embolus. 2. Study was performed with optimal evaluation of pulmonary arteries. However, there is no evidence for displaced intimal calcifications to indicate aortic dissection. 3. 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 Recommend annual imaging followup by CTA or MRA. This recommendation  follows 2010 ACCF/AHA/AATS/ACR/ASA/SCA/SCAI/SIR/STS/SVM Guidelines for the Diagnosis and Management of Patients with Thoracic Aortic Disease. Circulation. 2010; 121: H419-F790. Aortic aneurysm NOS (ICD10-I71.9) 4. Coronary artery calcifications. 5.  Aortic atherosclerosis.  (ICD10-I70.0) 6. Stable cardiomegaly. 7. Moderate RIGHT pleural effusion. 8. Hepatic steatosis. 9. Ascites in the UPPER abdomen. 10.  Emphysema (ICD10-J43.9). Electronically Signed   By: Norva Pavlov M.D.   On: 05/25/2020 14:49   US Abdomen Complete  Result Date: 05/25/2020 IMPRESSION: Coarsened echotexture with increased parenchymal echogenicity suggesting fatty infiltration or other diffuse hepatocellular disease. Small volume ascites. Electronically Signed   By: Maudry Mayhew MD   On: 05/25/2020 21:12   DG Chest Port 1 View  Result Date: 05/27/2020 IMPRESSION: No change from the prior exam. Electronically Signed   By: Alcide Clever M.D.   On: 05/27/2020 15:44    Telemetry    SR with PACs - Personally Reviewed  Cardiac Studies   2D Echocardiogram 8.2021   1. Left ventricular ejection fraction, by estimation, is 20 to 25%. The  left ventricle has severely decreased function. The left ventricle has no  regional wall motion abnormalities. The left ventricular internal cavity  size was moderately dilated.  Indeterminate diastolic filling due to E-A fusion.   2. Right ventricular systolic function is mildly reduced. The right  ventricular size is mildly enlarged. There is severely elevated pulmonary  artery systolic pressure. The estimated right ventricular systolic  pressure is 72.1 mmHg.   3. Left atrial size was moderately dilated.   4. Right atrial size was moderately dilated.   5. Moderate mitral valve regurgitation.   6. Tricuspid valve regurgitation is moderate.   7. Aortic valve regurgitation is mild to moderate. stenosis.   8. The inferior vena cava is dilated in size with <50% respiratory  variability,  suggesting right atrial pressure of 15 mmHg.  _____________   Cardiac Catheterization 8.2021  1.  Mild heavily calcified proximal LAD disease which does not seem to be obstructive.  No evidence of obstructive coronary artery disease overall. 2.  Severely reduced LV systolic function by echo.  Left ventricular angiography was not performed. 3.  Right heart catheterization showed mildly elevated filling pressures, mild pulmonary hypertension and severely reduced cardiac output.   RA: 6 mmHg RV: 39 over 2 mmHg Pulmonary capillary wedge pressure: 19 mmHg with normal waveforms. PA: 39/21 with a mean of 29 mmHg LVEDP: 17 mmHg. Cardiac output: 3.43 with a cardiac index of 1.86. _____________   2D Echocardiogram  5.16.2022  1. Left ventricular ejection fraction, by estimation, is 20 to 25%. The  left ventricle has severely decreased function. The left ventricle  demonstrates global hypokinesis. The left ventricular internal cavity size  was moderately dilated. Left  ventricular diastolic parameters are consistent with Grade II diastolic  dysfunction (pseudonormalization).  2. Right ventricular systolic function is mildly reduced. The right  ventricular size is moderately enlarged.  3. Left atrial size was mildly dilated.  4. Right atrial size was moderately dilated.  5. The mitral valve is abnormal. Moderate to severe mitral valve  regurgitation. No evidence of mitral stenosis.  6. Tricuspid valve regurgitation is moderate to severe.  7. The aortic valve is abnormal. Aortic valve regurgitation is moderate.  Mild aortic valve sclerosis is present, with no evidence of aortic valve  stenosis.  8. The inferior vena cava is normal in size with greater than 50%  respiratory variability, suggesting right atrial pressure of 3 mmHg.  9. Rounded opacity in the apex could be an organized thrombus. Consider cardiac MRI for evaluation. _____________    Patient Profile     70 y.o. 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  Assessment & Plan    1.  Acute on chronic HFrEF/NICM complicated by cardiorenal syndrome:  Admitted 5/14 in the setting of progressive dyspnea ad medication noncompliance w/ finding of CHF.  EF 20-25% by echo 08/2019 w/ nonobs dzs @ that time.  Repeat echo this admission with persistent cardiomyopathy of 20 to 25%.   BUN/Creat rising w/ IV diuresis leading this to be held and the patient to be transferred to the ICU on 5/16 with initiation of milrinone drip.  Unfortunately, his renal function continues to decline and we are unable to initiate IV Lasix at this time.  Unable to initiate  blocker, acei/arb/arni/mra in setting of soft BPs and acute on CKD.  Pending trend, may need RHC.  Recommend formal critical care consultation with placement of central access; trend coox.  Recommend palliative care consult to establish goals of care.  2.  Acute on chronic stage III kidney dzs:  BUN/Creat cont to rise.  Continue to hold lasix.  Follow.  Avoid nephrotoxic agents.  Consider nephrology consult.  3.  Demand Ischemia:  HsTrop mildly elevated w/ peak of 80 in the setting of above.  No c/p.  Prior h/o nonobs CAD by cath in 08/2019. This does not appear to represent ACS. Cont med rx  asa.  Not on statin 2/2 elevated LFTs.  3.  Possible apical thrombus: Noted on echo this admission.  Likely in the setting of severely reduced LV systolic function and low cardiac output.  IV heparin for now with close monitoring of hemoglobin and platelet count.  Consider cardiac MRI in the outpatient setting.  5.  Tob abuse:  Cessation advised.  6.  Ao dilation:  4cm TAA noted on CTA.  Will need f/u  imaging in a year - prob echo given CKD.  7. Essential HTN:  On  blocker @ home - held in setting of soft BPs.  8.  ETOH abuse:  Cessation advised.  H/o transaminitis w/ ascites.  9.  PAH:  RVSP 72.1 on echo this admission.  No PE on CTA.  Will likely  benefit from referral to advanced CHF/PAH clinic in [place] - though compliance likely to remain an issue.  Signed, Eula Listen, PA-C  05/28/2020, 10:36 AM    For questions or updates, please contact   Please consult [email or web address] for contact info under Cardiology/STEMI.

## 2020-05-29 ENCOUNTER — Inpatient Hospital Stay: Payer: Medicare Other

## 2020-05-29 DIAGNOSIS — Z7189 Other specified counseling: Secondary | ICD-10-CM

## 2020-05-29 DIAGNOSIS — R0602 Shortness of breath: Secondary | ICD-10-CM | POA: Diagnosis not present

## 2020-05-29 DIAGNOSIS — I513 Intracardiac thrombosis, not elsewhere classified: Secondary | ICD-10-CM

## 2020-05-29 DIAGNOSIS — I428 Other cardiomyopathies: Secondary | ICD-10-CM

## 2020-05-29 DIAGNOSIS — Z515 Encounter for palliative care: Secondary | ICD-10-CM

## 2020-05-29 LAB — BASIC METABOLIC PANEL
Anion gap: 9 (ref 5–15)
BUN: 52 mg/dL — ABNORMAL HIGH (ref 8–23)
CO2: 22 mmol/L (ref 22–32)
Calcium: 8.6 mg/dL — ABNORMAL LOW (ref 8.9–10.3)
Chloride: 109 mmol/L (ref 98–111)
Creatinine, Ser: 2.5 mg/dL — ABNORMAL HIGH (ref 0.61–1.24)
GFR, Estimated: 27 mL/min — ABNORMAL LOW (ref >60)
Glucose, Bld: 71 mg/dL (ref 70–99)
Potassium: 3.9 mmol/L (ref 3.5–5.1)
Sodium: 140 mmol/L (ref 135–145)

## 2020-05-29 LAB — CULTURE, BLOOD (SINGLE): Specimen Description: ADEQUATE

## 2020-05-29 LAB — GLUCOSE, CAPILLARY
Glucose-Capillary: 44 mg/dL — CL (ref 70–99)
Glucose-Capillary: 98 mg/dL (ref 70–99)
Glucose-Capillary: 100 mg/dL — ABNORMAL HIGH (ref 70–99)

## 2020-05-29 LAB — HEPARIN LEVEL (UNFRACTIONATED): Heparin Unfractionated: 0.64 IU/mL (ref 0.30–0.70)

## 2020-05-29 LAB — CBC
HCT: 36.1 % — ABNORMAL LOW (ref 39.0–52.0)
Hemoglobin: 12.3 g/dL — ABNORMAL LOW (ref 13.0–17.0)
MCH: 33.3 pg (ref 26.0–34.0)
MCHC: 34.1 g/dL (ref 30.0–36.0)
MCV: 97.8 fL (ref 80.0–100.0)
Platelets: 57 10*3/uL — ABNORMAL LOW (ref 150–400)
RBC: 3.69 MIL/uL — ABNORMAL LOW (ref 4.22–5.81)
RDW: 17.8 % — ABNORMAL HIGH (ref 11.5–15.5)
WBC: 7.5 10*3/uL (ref 4.0–10.5)
nRBC: 1.1 % — ABNORMAL HIGH (ref 0.0–0.2)

## 2020-05-29 LAB — HEMOGLOBIN AND HEMATOCRIT, BLOOD
HCT: 39.9 % (ref 39.0–52.0)
Hemoglobin: 12.9 g/dL — ABNORMAL LOW (ref 13.0–17.0)

## 2020-05-29 LAB — PHOSPHORUS: Phosphorus: 3.3 mg/dL (ref 2.5–4.6)

## 2020-05-29 LAB — MAGNESIUM: Magnesium: 2.1 mg/dL (ref 1.7–2.4)

## 2020-05-29 MED ORDER — DEXTROSE 50 % IV SOLN
INTRAVENOUS | Status: AC
  Administered 2020-05-29: 50 mL via INTRAVENOUS
  Filled 2020-05-29: qty 50

## 2020-05-29 MED ORDER — ORAL CARE MOUTH RINSE
15.0000 mL | Freq: Two times a day (BID) | OROMUCOSAL | Status: DC
  Administered 2020-05-29 – 2020-06-03 (×9): 15 mL via OROMUCOSAL

## 2020-05-29 MED ORDER — DEXTROSE 50 % IV SOLN: 50.0000 mL | Freq: Once | INTRAVENOUS | Status: AC

## 2020-05-29 NOTE — Progress Notes (Signed)
[facility]  ROUNDING NOTE   Subjective:   Curtis Walters is a 70 y.o.  male with , who was admitted to [facility] on 05/25/2020 for acute on chronic CHF. Precious medical concerns include chronic back pain, CAD, AAA, reduced EF heart failure, tobacco abuse, and hypertension. He presented to the ED with shortness of breath.  Patient sedated Currently on 3L [place] Foley in place Heparin gtt  Objective:  Vital signs in last 24 hours:  Temp:  [97.3 F (36.3 C)-98 F (36.7 C)] 97.3 F (36.3 C) (05/18 0400) Pulse Rate:  [86-107] 91 (05/18 0900) Resp:  [0-20] 11 (05/18 0900) BP: (108-126)/(77-100) 109/90 (05/18 0900) SpO2:  [97 %-100 %] 100 % (05/18 0900) Weight:  [61.8 kg] 61.8 kg (05/18 0414)  Weight change: -2.2 kg Filed Weights   05/27/20 0450 05/28/20 0440 05/29/20 0414  Weight: 62.1 kg 64 kg 61.8 kg    Intake/Output: I/O last 3 completed shifts: In: 348.2 [I.V.:348.2] Out: 1075 [Urine:1075]   Intake/Output this shift:  No intake/output data recorded.  Physical Exam: General: NAD  Head: Normocephalic, atraumatic. Moist oral mucosal membranes  Eyes: Anicteric  Lungs:  Clear to auscultation, normal breathing effort, 3L [place]  Heart: Regular rate and rhythm  Abdomen:  Soft, nontender  Extremities:  no peripheral edema.  Neurologic: Sedated  Skin: No lesions    Basic Metabolic Panel: Recent Labs  Lab 05/25/20 1146 05/25/20 2315 05/26/20 0554 05/27/20 0412 05/28/20 0155 05/28/20 1012 05/29/20 0424  NA 138  --  139 136  --  136 140  K 3.7  --  4.0 4.6  --  4.7 3.9  CL 105  --  107 105  --  105 109  CO2 19*  --  20* 19*  --  21* 22  GLUCOSE 87  --  203* 133*  --  73 71  BUN 41*  --  42* 45*  --  55* 52*  CREATININE 2.03*  --  2.15* 2.69*  --  3.18* 2.50*  CALCIUM 9.2  --  8.9 8.7*  --  8.5* 8.6*  MG  --  1.8  --   --  1.8  --  2.1  PHOS  --   --   --   --  4.2  --  3.3    Liver Function Tests: Recent Labs  Lab 05/25/20 1146 05/26/20 0554  AST 85* 84*   ALT 60* 60*  ALKPHOS 78 74  BILITOT 4.1* 3.7*  PROT 6.9 6.2*  ALBUMIN 3.2* 3.0*   No results for input(s): LIPASE, AMYLASE in the last 168 hours. No results for input(s): AMMONIA in the last 168 hours.  CBC: Recent Labs  Lab 05/25/20 1146 05/26/20 0554 05/27/20 0412 05/28/20 0155 05/29/20 0424  WBC 6.0 3.1* 8.3 9.2 7.5  NEUTROABS 3.9  --   --   --   --   HGB 13.9 13.3 13.4 12.1* 12.3*  HCT 41.5 39.3 39.3 34.8* 36.1*  MCV 96.7 96.1 97.3 96.7 97.8  PLT 75* 67* 59* 54* 57*    Cardiac Enzymes: Recent Labs  Lab 05/26/20 0554  CKTOTAL 96    BNP: Invalid input(s): POCBNP  CBG: Recent Labs  Lab 05/26/20 0004 05/27/20 1632 05/27/20 1930 05/27/20 2309 05/28/20 0319  GLUCAP 198* 99 93 110* 83    Microbiology: Results for orders placed or performed during the hospital encounter of 05/25/20  Blood culture (single)     Status: Abnormal   Collection Time: 05/25/20  3:16 PM  Specimen: BLOOD  Result Value Ref Range Status   Specimen Description   Final    BLOOD Blood Culture adequate volume Performed at [facility], [address] [place], [place] [address]    Special Requests   Final    BOTTLES DRAWN AEROBIC AND ANAEROBIC BLOOD RIGHT FOREARM Performed at [facility], [address] [place], [place] [address]    Culture  Setup Time   Final    GRAM POSITIVE COCCI AEROBIC BOTTLE ONLY CRITICAL RESULT CALLED TO, READ BACK BY AND VERIFIED WITH: JASON ROBBINS @0342  05/27/20    Culture (A)  Final    FACKLAMIA HOMINIS Standardized susceptibility testing for this organism is not available. Performed at [facility], [address], [place], [place]    Report Status 05/29/2020 FINAL  Final  Blood Culture ID Panel (Reflexed)     Status: None   Collection Time: 05/25/20  3:16 PM  Result Value Ref Range Status   Enterococcus faecalis NOT DETECTED NOT DETECTED Final   Enterococcus Faecium NOT DETECTED NOT DETECTED Final    Listeria monocytogenes NOT DETECTED NOT DETECTED Final   Staphylococcus species NOT DETECTED NOT DETECTED Final   Staphylococcus aureus (BCID) NOT DETECTED NOT DETECTED Final   Staphylococcus epidermidis NOT DETECTED NOT DETECTED Final   Staphylococcus lugdunensis NOT DETECTED NOT DETECTED Final   Streptococcus species NOT DETECTED NOT DETECTED Final   Streptococcus agalactiae NOT DETECTED NOT DETECTED Final   Streptococcus pneumoniae NOT DETECTED NOT DETECTED Final   Streptococcus pyogenes NOT DETECTED NOT DETECTED Final   A.calcoaceticus-baumannii NOT DETECTED NOT DETECTED Final   Bacteroides fragilis NOT DETECTED NOT DETECTED Final   Enterobacterales NOT DETECTED NOT DETECTED Final   Enterobacter cloacae complex NOT DETECTED NOT DETECTED Final   Escherichia coli NOT DETECTED NOT DETECTED Final   Klebsiella aerogenes NOT DETECTED NOT DETECTED Final   Klebsiella oxytoca NOT DETECTED NOT DETECTED Final   Klebsiella pneumoniae NOT DETECTED NOT DETECTED Final   Proteus species NOT DETECTED NOT DETECTED Final   Salmonella species NOT DETECTED NOT DETECTED Final   Serratia marcescens NOT DETECTED NOT DETECTED Final   Haemophilus influenzae NOT DETECTED NOT DETECTED Final   Neisseria meningitidis NOT DETECTED NOT DETECTED Final   Pseudomonas aeruginosa NOT DETECTED NOT DETECTED Final   Stenotrophomonas maltophilia NOT DETECTED NOT DETECTED Final   Candida albicans NOT DETECTED NOT DETECTED Final   Candida auris NOT DETECTED NOT DETECTED Final   Candida glabrata NOT DETECTED NOT DETECTED Final   Candida krusei NOT DETECTED NOT DETECTED Final   Candida parapsilosis NOT DETECTED NOT DETECTED Final   Candida tropicalis NOT DETECTED NOT DETECTED Final   Cryptococcus neoformans/gattii NOT DETECTED NOT DETECTED Final    Comment: Performed at [facility], [address] [place], [place]  SARS CORONAVIRUS 2 (TAT 6-24 HRS) Nasopharyngeal Nasopharyngeal Swab     Status: None    Collection Time: 05/25/20  3:21 PM   Specimen: Nasopharyngeal Swab  Result Value Ref Range Status   SARS Coronavirus 2 NEGATIVE NEGATIVE Final    Comment: (NOTE) SARS-CoV-2 target nucleic acids are NOT DETECTED.  The SARS-CoV-2 RNA is generally detectable in upper and lower respiratory specimens during the acute phase of infection. Negative results do not preclude SARS-CoV-2 infection, do not rule out co-infections with other pathogens, and should not be used as the sole basis for treatment or other patient management decisions. Negative results must be combined with clinical observations, patient history, and epidemiological  information. The expected result is Negative.  Fact Sheet for Patients: HairSlick.no  Fact Sheet for Healthcare Providers: quierodirigir.com  This test is not yet approved or cleared by the [place] FDA and  has been authorized for detection and/or diagnosis of SARS-CoV-2 by FDA under an Emergency Use Authorization (EUA). This EUA will remain  in effect (meaning this test can be used) for the duration of the COVID-19 declaration under Se ction 564(b)(1) of the Act, 21 U.S.C. section 360bbb-3(b)(1), unless the authorization is terminated or revoked sooner.  Performed at [facility], [address], [place], [place] [address]   MRSA PCR Screening     Status: None   Collection Time: 05/26/20 12:10 AM   Specimen: Nasal Mucosa; Nasopharyngeal  Result Value Ref Range Status   MRSA by PCR NEGATIVE NEGATIVE Final    Comment:        The GeneXpert MRSA Assay (FDA approved for NASAL specimens only), is one component of a comprehensive MRSA colonization surveillance program. It is not intended to diagnose MRSA infection nor to guide or monitor treatment for MRSA infections. Performed at [facility], [address] [place], [place] [address]     Coagulation Studies: No results  for input(s): LABPROT, INR in the last 72 hours.  Urinalysis: No results for input(s): COLORURINE, LABSPEC, PHURINE, GLUCOSEU, HGBUR, BILIRUBINUR, KETONESUR, PROTEINUR, UROBILINOGEN, NITRITE, LEUKOCYTESUR in the last 72 hours.  Invalid input(s): APPERANCEUR    Imaging: DG Chest Port 1 View  Result Date: 05/28/2020 CLINICAL DATA:  Central line placement. EXAM: PORTABLE CHEST 1 VIEW COMPARISON:  Chest x-ray from yesterday. FINDINGS: The right IJ central venous catheter tip is in the mid/distal SVC near the level of the carina. No complicating features. Stable cardiac enlargement, interstitial edema and right pleural effusion. IMPRESSION: Right IJ central venous catheter in good position without complicating features. Electronically Signed   By: Rudie Meyer M.D.   On: 05/28/2020 12:51   DG Chest Port 1 View  Result Date: 05/27/2020 CLINICAL DATA:  Dyspnea EXAM: PORTABLE CHEST 1 VIEW COMPARISON:  05/25/2020 FINDINGS: Cardiac shadow is enlarged but stable. Lungs are well aerated bilaterally. Minimal right costophrenic angle blunting is seen. This is stable from the prior exam. No new focal infiltrate is seen. No bony abnormality is noted. IMPRESSION: No change from the prior exam. Electronically Signed   By: Alcide Clever M.D.   On: 05/27/2020 15:44     Medications:   . heparin 1,000 Units/hr (05/29/20 0600)   . aspirin EC  81 mg Oral Daily  . Chlorhexidine Gluconate Cloth  6 each Topical Daily  . folic acid  1 mg Oral Daily  . multivitamin with minerals  1 tablet Oral Daily  . thiamine  100 mg Oral Daily   acetaminophen **OR** acetaminophen, ALPRAZolam, LORazepam **OR** LORazepam  Assessment/ Plan:  Mr. Fareed Fung is a 70 y.o.  male   1. Acute Kidney Injury on chronic kidney disease stage 3Bwith baseline creatinine 2.03 and GFR of 34 on 05/25/20.  Risk factors include atherosclerosis, CHF, and hypertension IV contrast exposure on 05/25/20 No acute need for dialysis at this  time Avoid nephrotoxic agents if possible Renal function improved today Will continue to monitor  Lab Results  Component Value Date   CREATININE 2.50 (H) 05/29/2020   CREATININE 3.18 (H) 05/28/2020   CREATININE 2.69 (H) 05/27/2020    Intake/Output Summary (Last 24 hours) at 05/29/2020 1259 Last data filed at 05/29/2020 0600 Gross per 24 hour  Intake 213.36 ml  Output 825 ml  Net -611.64 ml   2. Hypotension Home regimen includes Metoprolol, held Was on Milrinone gtt, discontinued today Current BP 125/91    LOS: 4   5/18/202212:59 PM

## 2020-05-29 NOTE — Progress Notes (Signed)
[facility]  - [place] [facility]   PATIENT NAME: Curtis Walters    MR#:  [id number]  DATE OF BIRTH:  10-09-1950  SUBJECTIVE:   Patient opens eyes to verbal commands however unable to hold any meaningful conversation very sleepy. Receiving Ativan on and off. Has mittens in both his hands. Per RN issues with agitation. Currently on IV heparin drip. REVIEW OF SYSTEMS:   Review of Systems  Unable to perform ROS: Mental acuity   Tolerating Diet: Tolerating PT:   DRUG ALLERGIES:   Allergies  Allergen Reactions  . Penicillins Other (See Comments)    Did it involve swelling of the face/tongue/throat, SOB, or low BP? No Did it involve sudden or severe rash/hives, skin peeling, or any reaction on the inside of your mouth or nose? No Did you need to seek medical attention at a hospital or doctor's office? No When did it last happen? If all above answers are "NO", may proceed with cephalosporin use.    VITALS:  Blood pressure (!) 125/91, pulse 99, temperature (!) 97.3 F (36.3 C), temperature source Axillary, resp. rate 19, height 6\' 1"  (1.854 m), weight 61.8 kg, SpO2 100 %.  PHYSICAL EXAMINATION:   Physical Examlimited  GENERAL:  70 y.o.-year-old patient lying in the bed with no acute distress. Chronically ill deconditioned HEENT: Head atraumatic, normocephalic. Oropharynx and nasopharynx clear.  LUNGS: Normal breath sounds bilaterally, no wheezing, rales, rhonchi. No use of accessory muscles of respiration.  CARDIOVASCULAR: S1, S2 normal. No murmurs, rubs, or gallops.  ABDOMEN: Soft, nontender, nondistended. Bowel sounds present. No organomegaly or mass.  EXTREMITIES: No cyanosis, clubbing or edema b/l.    PSYCHIATRIC:  patient is lethargic.  SKIN: No obvious rash, lesion, or ulcer.   LABORATORY PANEL:  CBC Recent Labs  Lab 05/29/20 0424  WBC 7.5  HGB 12.3*  HCT 36.1*  PLT 57*    Chemistries  Recent Labs  Lab 05/26/20 0554 05/27/20 0412  05/29/20 0424  NA 139   < > 140  K 4.0   < > 3.9  CL 107   < > 109  CO2 20*   < > 22  GLUCOSE 203*   < > 71  BUN 42*   < > 52*  CREATININE 2.15*   < > 2.50*  CALCIUM 8.9   < > 8.6*  MG  --    < > 2.1  AST 84*  --   --   ALT 60*  --   --   ALKPHOS 74  --   --   BILITOT 3.7*  --   --    < > = values in this interval not displayed.   Cardiac Enzymes No results for input(s): TROPONINI in the last 168 hours. RADIOLOGY:  DG Chest Port 1 View  Result Date: 05/28/2020 CLINICAL DATA:  Central line placement. EXAM: PORTABLE CHEST 1 VIEW COMPARISON:  Chest x-ray from yesterday. FINDINGS: The right IJ central venous catheter tip is in the mid/distal SVC near the level of the carina. No complicating features. Stable cardiac enlargement, interstitial edema and right pleural effusion. IMPRESSION: Right IJ central venous catheter in good position without complicating features. Electronically Signed   By: 05/30/2020 M.D.   On: 05/28/2020 12:51   ASSESSMENT AND PLAN:  70/M with history of nonischemic cardiomyopathy, EF of 20%, mild pulmonary hypertension, moderate MR/TR, mild to moderate AAS, alcohol abuse, alcoholic liver disease, tobacco abuse, chronic back pain presented to the ED with dyspnea.,  He is a very poor historian with significant cognitive and memory deficits.  Acute on chronic systolic congestive heart failure/cardio renal syndrome Right pleural effusion Ascites Severe cardiomyopathy EF of 20 to 25% -- there is concerned about noncompliance with diuretics as well as diet and chronic liver disease contributing to volume overload -- seemed IV Lasix however worsening creatinine transfer to ICU was on IV milrinone gtt per 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 Plans to meet with son and daughter  Procedures: Family communication : none today. ICU nurse practitioner Jeri Modena discussed with patient's fianc Consults : cardiology, ICU CODE STATUS: full DVT Prophylaxis : heparin drip Level of care: ICU Status is: Inpatient  Remains inpatient appropriate because:Inpatient level of care appropriate due to severity of illness   Dispo: The patient is from: Home              Anticipated d/c is to: TBD              Patient currently is not medically stable to d/c.   Difficult to place patient No        TOTAL TIME TAKING CARE OF THIS PATIENT: 35 minutes.  >50% time spent on counselling and coordination of care  Note: This dictation was prepared with Dragon dictation along with smaller phrase technology. Any transcriptional errors that result from this process are unintentional.  Enedina Finner M.D    [facility]   CC: Primary care physician; Patient, No Pcp Per (Inactive)Patient ID: Jerene Canny, male   DOB: [birth year], 70 y.o.   MRN: [id number]

## 2020-05-29 NOTE — Progress Notes (Signed)
NAME:  Curtis Walters, MRN:  [id number], DOB:  [birth year], LOS: 4 ADMISSION DATE:  05/25/2020, CONSULTATION DATE:  05/28/2020 REFERRING MD:  Dr. Okey Dupre, CHIEF COMPLAINT:  Shortness of breath  Brief Pt Description/Synopsis:  70 year old male with past medical history significant for nonischemic cardiomyopathy, EF of 20%, mild pulmonary hypertension, moderate MR/TR, mild to moderate AAS, alcohol abuse, alcoholic liver disease, tobacco abuse, chronic back pain presented to the ED with dyspnea.  Found to have Acute on Chronic HFrEF, AKI on CKD Stage III and suspected Cardiorenal Syndrome now requiring Milrinone infusion.  History of Present Illness:  Curtis Walters is a 70 year old male with a significant past medical history as listed below who presented to [facility] ED on 05/25/2020 due to complaints of shortness of breath. In the emergency room labs were notable for creatinine of 2.0, mildly elevated LFTs, platelet of 75, lactate of 4.6, BNP greater than 4500, CTA without PE, noted ascites, hepatic steatosis, emphysema, moderate right pleural effusion  He was admitted to the MedSurg unit by the hospitalist for further work-up and treatment of acute decompensated HFrEF (EF 20-25%) and AKI on CKD 3.  Trial of IV diuresis was attempted, however was stopped due to worsening renal failure.  Concern for Cardiorenal syndrome.  He was transferred to ICU on 05/27/20 by Cardiology for initiation of Milrinone infusion.  Central line placed on 5/17/212.  PCCM, Nephrology, and Palliative Care have been consulted for assistance in management.  Pertinent  Medical History  nonischemic cardiomyopathy HFrEF (EF of 20%) Coronary Artery Disease Pulmonary hypertension Moderate Mitral & Tricuspid Valve Regurgitation Hypertension Dilated aortic root alcohol abuse alcoholic liver disease tobacco abuse chronic back pain   Significant Hospital Events: Including procedures, antibiotic start and stop dates in addition to other  pertinent events   . 05/25/20: Admitted to Hospital by Hospitalist . 05/27/20: Concern for Cardiorenal syndrome, transferred to ICU for Milrinone infusion . 05/28/20: PCCM consulted for central line placement.  Palliative Care and Nephrology consulted . 05/29/20: Remains encephalopathic,required ativan overnight due to agitation, creatinine slightly improved to 2.5  Interim History / Subjective:  -Overnight with episodes of agitation requiring Ativan x3 doses (total of 6 mg) -Remains encephalopathic, lethargic status post Ativan administration -On heparin gtt, Milrinone is off this morning -Afebrile, hemodynamically stable, no vasopressors, on 3L [place] -Creatinine slightly improved to 2.5 (previously 3.18 ); 825 ml urine output yesterday (-.440 ml since admit)  Objective   Blood pressure 109/90, pulse 91, temperature (!) 97.3 F (36.3 C), temperature source Axillary, resp. rate 11, height 6\' 1"  (1.854 m), weight 61.8 kg, SpO2 100 %. CVP:  [4 mmHg-28 mmHg] 21 mmHg      Intake/Output Summary (Last 24 hours) at 05/29/2020 1049 Last data filed at 05/29/2020 0600 Gross per 24 hour  Intake 213.36 ml  Output 825 ml  Net -611.64 ml   Filed Weights   05/27/20 0450 05/28/20 0440 05/29/20 0414  Weight: 62.1 kg 64 kg 61.8 kg    Examination: General: Acute on chronically ill appearing male, laying in bed, on 3L [place], in NAD HENT: Atraumatic, normocephalic, neck supple, + JVD Lungs: Coarse breath sounds bilaterally, even, nonlabored Cardiovascular: Regular rate and rhythm, tachycardia, 2/6 systolic murmur, no rubs or gallops, 2+ distal pulses Abdomen: Soft, nontender, nondistended, no guarding or rebound tenderness, BS+ x4 Extremities: Normal bulk and tone, no deformities, no edema Neuro: Lethargic post ativan administration, arouses to voice, purposeful movement to all extremities but will not follow commands, no focal deficits noted, pupils PERRL GU:  foley catheter in place  Labs/imaging that I  havepersonally reviewed  (right click and "Reselect all SmartList Selections" daily)  Labs 05/29/2020: BUN 52, Cr. 2.5, Hgb 12.3, Hct 36.1, platelets 57 Chest x-ray 05/28/2020:The right IJ central venous catheter tip is in the mid/distal SVC near the level of the carina. No complicating features.Stable cardiac enlargement, interstitial edema and right pleural effusion.  Resolved Hospital Problem list     Assessment & Plan:   Acute on Chronic HFrEF (LVEF 20-25%) Left Ventricular Apical Thrombus Mildly elevated Troponin, suspect demand ischemia -Continuous cardiac monitoring -Maintain MAP greater than 65 -Cardiology following, appreciate input -HS Troponin peaked at 80 -Milrinone infusion as per Cardiology -Continue heparin drip -Diuresis as blood pressure and renal function permits -Repeat Echocardiogram on 05/27/20 with LVEF 20-25%, Grade II diastolic dysfunction, RV systolic function mildly reduced, moderate to severe MR, moderate to severe TR, concern for thrombus   AKI on CKD Stage III Suspect Cardiorenal Syndrome -Monitor I&O's / urinary output -Follow BMP -Ensure adequate renal perfusion -Avoid nephrotoxic agents as able -Replace electrolytes as indicated -Continue Milrinone infusion as per Cardiology -Nephrology following, appreciate input  Acute Hypoxic Respiratory Failure in setting of HFrEF, Pulmonary Artery Hypertension, Small Right Pleural Effusion -Supplemental O2 as needed to maintain O2 sats greater than 90% -Follow intermittent chest x-ray and ABG as needed -Currently protecting his airway, but high risk for intubation due to encephalopathy -Diuresis as blood pressure and renal function permits -As needed bronchodilators -Encourage pulmonary hygiene  Alcoholic Liver Disease Superimposed Hepatic congestion Ascites -Follow LFTs -Continue thiamine -High risk for DTs, continue CIWA protocol  Thrombocytopenia, suspect secondary to chronic liver disease &  Alcoholism -Monitor for S/Sx of bleeding -Trend CBC -Heparin drip for Anticoagulation/VTE Prophylaxis (for LV apical thrombus)  -Transfuse for Hgb <7 -Transfuse platelets for platelet count less than 50 and active bleeding  Acute Metabolic Encephalopathy High risk for DT's  -Provide supportive care -CIWA protocol -Avoid sedating meds as able    Patient is critically ill with multiorgan failure.  Prognosis is very guarded, and long-term prognosis is poor.  High risk for intubation, cardiac arrest and death.  Recommend DNR/DNI status.  Palliative care consulted.   Best practice (right click and "Reselect all SmartList Selections" daily)  Diet:  NPO Pain/Anxiety/Delirium protocol (if indicated): No VAP protocol (if indicated): Not indicated DVT prophylaxis: Systemic AC GI prophylaxis: PPI Glucose control:  SSI No Central venous access: central line placed for Milrinone Arterial line:  N/A Foley:  Yes and is still needed Mobility:  bed rest  PT consulted: Yes Last date of multidisciplinary goals of care discussion [05/29/2020] Code Status:  full code Disposition: ICU   Labs   CBC: Recent Labs  Lab 05/25/20 1146 05/26/20 0554 05/27/20 0412 05/28/20 0155 05/29/20 0424  WBC 6.0 3.1* 8.3 9.2 7.5  NEUTROABS 3.9  --   --   --   --   HGB 13.9 13.3 13.4 12.1* 12.3*  HCT 41.5 39.3 39.3 34.8* 36.1*  MCV 96.7 96.1 97.3 96.7 97.8  PLT 75* 67* 59* 54* 57*    Basic Metabolic Panel: Recent Labs  Lab 05/25/20 1146 05/25/20 2315 05/26/20 0554 05/27/20 0412 05/28/20 0155 05/28/20 1012 05/29/20 0424  NA 138  --  139 136  --  136 140  K 3.7  --  4.0 4.6  --  4.7 3.9  CL 105  --  107 105  --  105 109  CO2 19*  --  20* 19*  --  21*  22  GLUCOSE 87  --  203* 133*  --  73 71  BUN 41*  --  42* 45*  --  55* 52*  CREATININE 2.03*  --  2.15* 2.69*  --  3.18* 2.50*  CALCIUM 9.2  --  8.9 8.7*  --  8.5* 8.6*  MG  --  1.8  --   --  1.8  --  2.1  PHOS  --   --   --   --  4.2  --  3.3    GFR: Estimated Creatinine Clearance: 23.7 mL/min (A) (by C-G formula based on SCr of 2.5 mg/dL (H)). Recent Labs  Lab 05/25/20 1146 05/25/20 1343 05/25/20 1927 05/25/20 1946 05/25/20 2315 05/26/20 0554 05/27/20 0412 05/28/20 0155 05/29/20 0424  PROCALCITON  --   --   --  0.20  --   --   --   --   --   WBC 6.0  --   --   --   --  3.1* 8.3 9.2 7.5  LATICACIDVEN 4.6* 4.0* 5.9*  --  4.5*  --   --   --   --     Liver Function Tests: Recent Labs  Lab 05/25/20 1146 05/26/20 0554  AST 85* 84*  ALT 60* 60*  ALKPHOS 78 74  BILITOT 4.1* 3.7*  PROT 6.9 6.2*  ALBUMIN 3.2* 3.0*   No results for input(s): LIPASE, AMYLASE in the last 168 hours. No results for input(s): AMMONIA in the last 168 hours.  ABG    Component Value Date/Time   PHART 7.46 (H) 05/25/2020 2247   PCO2ART 30 (L) 05/25/2020 2247   PO2ART 85 05/25/2020 2247   HCO3 24.8 05/28/2020 1820   ACIDBASEDEF 0.4 05/28/2020 1820   O2SAT 82.5 05/28/2020 1820     Coagulation Profile: Recent Labs  Lab 05/25/20 2315  INR 1.6*    Cardiac Enzymes: Recent Labs  Lab 05/26/20 0554  CKTOTAL 96    HbA1C: Hgb A1c MFr Bld  Date/Time Value Ref Range Status  05/26/2020 05:54 AM 5.3 4.8 - 5.6 % Final    Comment:    (NOTE) Pre diabetes:          5.7%-6.4%  Diabetes:              >6.4%  Glycemic control for   <7.0% adults with diabetes   08/26/2019 06:07 AM 4.9 4.8 - 5.6 % Final    Comment:    (NOTE) Pre diabetes:          5.7%-6.4%  Diabetes:              >6.4%  Glycemic control for   <7.0% adults with diabetes     CBG: Recent Labs  Lab 05/26/20 0004 05/27/20 1632 05/27/20 1930 05/27/20 2309 05/28/20 0319  GLUCAP 198* 99 93 110* 83    Review of Systems:   Unable to assess due to AMS  Past Medical History:  He,  has a past medical history of Alcohol use, Chronic back pain, Coronary artery disease, non-occlusive, Dilated aortic root (HCC), HFrEF (heart failure with reduced ejection fraction)  (HCC), Hypertension, NICM (nonischemic cardiomyopathy) (HCC), Tobacco abuse, and Transaminitis.   Surgical History:   Past Surgical History:  Procedure Laterality Date  . RIGHT AND LEFT HEART CATH N/A 08/28/2019   Procedure: RIGHT AND LEFT HEART CATH with possible percutaneous intervention;  Surgeon: Iran Ouch, MD;  Location: [facility] INVASIVE CV LAB;  Service: Cardiovascular;  Laterality: N/A;  Social History:   reports that he has been smoking cigarettes. He has never used smokeless tobacco. He reports current alcohol use of about 35.0 standard drinks of alcohol per week. He reports previous drug use.   Family History:  His family history includes Sickle cell anemia in his sister.   Allergies Allergies  Allergen Reactions  . Penicillins Other (See Comments)    Did it involve swelling of the face/tongue/throat, SOB, or low BP? No Did it involve sudden or severe rash/hives, skin peeling, or any reaction on the inside of your mouth or nose? No Did you need to seek medical attention at a hospital or doctor's office? No When did it last happen? If all above answers are "NO", may proceed with cephalosporin use.     Home Medications  Prior to Admission medications   Medication Sig Start Date End Date Taking? Authorizing Provider  metoprolol succinate (TOPROL XL) 25 MG 24 hr tablet Take 1 tablet (25 mg total) by mouth daily. Patient not taking: Reported on 05/25/2020 04/20/20 04/20/21  Jene Every, MD  torsemide (DEMADEX) 20 MG tablet Take 2 tablets (40 mg total) by mouth daily. 04/20/20 07/19/20  Jene Every, MD     Critical care time: 35 minutes    Harlon Ditty, AGACNP-BC [facility] Prefer epic messenger for cross cover needs If after hours, please call E-link

## 2020-05-29 NOTE — Progress Notes (Signed)
Notified by RN pt noted to have large right sided neck hematoma near right central line site.  Upon assessment right sided neck hematoma present, therefore will order stat CT Soft Tissue Neck to further assess site.  Pt currently has no medications infusing in central line, and at this time will pause heparin gtt.  Will continue to monitor and assess pt.  Sonda Rumble, AGNP  Pulmonary/Critical Care Pager [phone] (please enter 7 digits) PCCM Consult Pager [phone] (please enter 7 digits)

## 2020-05-29 NOTE — Progress Notes (Signed)
Progress Note  Patient Name: Curtis Walters Date of Encounter: 05/29/2020  [facility] Cardiologist: Julien Nordmann, MD   Subjective   Patient evaluated this a.m., does not follow commands, received IV Ativan earlier due to agitation.  Has mittens on.  Inpatient Medications    Scheduled Meds: . aspirin EC  81 mg Oral Daily  . Chlorhexidine Gluconate Cloth  6 each Topical Daily  . folic acid  1 mg Oral Daily  . multivitamin with minerals  1 tablet Oral Daily  . thiamine  100 mg Oral Daily   Continuous Infusions: . heparin 1,000 Units/hr (05/29/20 0600)   PRN Meds: acetaminophen **OR** acetaminophen, ALPRAZolam, LORazepam **OR** LORazepam   Vital Signs    Vitals:   05/29/20 1000 05/29/20 1100 05/29/20 1200 05/29/20 1300  BP: (!) 114/91 110/80 104/80 (!) 125/91  Pulse: 90 96 (!) 101 99  Resp: (!) 0 (!) 22 (!) 21 19  Temp:      TempSrc:      SpO2: 100% 100% 100% 100%  Weight:      Height:        Intake/Output Summary (Last 24 hours) at 05/29/2020 1330 Last data filed at 05/29/2020 0600 Gross per 24 hour  Intake 213.36 ml  Output 825 ml  Net -611.64 ml   Last 3 Weights 05/29/2020 05/28/2020 05/27/2020  Weight (lbs) 136 lb 3.9 oz 141 lb 1.5 oz 137 lb  Weight (kg) 61.8 kg 64 kg 62.143 kg      Telemetry    Sinus rhythm- Personally Reviewed  ECG    No new EKG reviewed- Personally Reviewed  Physical Exam   GEN:  Appears confused Neck: No JVD Cardiac: RRR, no murmurs, rubs, or gallops.  Respiratory:  Diminished breath sounds at bases, poor inspiratory effort GI: Soft, nontender, non-distended  MS: No edema; No deformity. Neuro:   Unable to assess Psych: Unable to assess  Labs    High Sensitivity Troponin:   Recent Labs  Lab 05/25/20 1147 05/25/20 1624 05/25/20 1754 05/25/20 2217  TROPONINIHS 64* 79* 80* 67*      Chemistry Recent Labs  Lab 05/25/20 1146 05/26/20 0554 05/27/20 0412 05/28/20 1012 05/29/20 0424  NA 138 139 136 136 140  K 3.7  4.0 4.6 4.7 3.9  CL 105 107 105 105 109  CO2 19* 20* 19* 21* 22  GLUCOSE 87 203* 133* 73 71  BUN 41* 42* 45* 55* 52*  CREATININE 2.03* 2.15* 2.69* 3.18* 2.50*  CALCIUM 9.2 8.9 8.7* 8.5* 8.6*  PROT 6.9 6.2*  --   --   --   ALBUMIN 3.2* 3.0*  --   --   --   AST 85* 84*  --   --   --   ALT 60* 60*  --   --   --   ALKPHOS 78 74  --   --   --   BILITOT 4.1* 3.7*  --   --   --   GFRNONAA 34* 32* 25* 20* 27*  ANIONGAP 14 12 12 10 9      Hematology Recent Labs  Lab 05/27/20 0412 05/28/20 0155 05/29/20 0424  WBC 8.3 9.2 7.5  RBC 4.04* 3.60* 3.69*  HGB 13.4 12.1* 12.3*  HCT 39.3 34.8* 36.1*  MCV 97.3 96.7 97.8  MCH 33.2 33.6 33.3  MCHC 34.1 34.8 34.1  RDW 18.4* 17.9* 17.8*  PLT 59* 54* 57*    BNP Recent Labs  Lab 05/25/20 2315  BNP >4,500.0*  DDimer No results for input(s): DDIMER in the last 168 hours.   Radiology    DG Chest Port 1 View  Result Date: 05/28/2020 CLINICAL DATA:  Central line placement. EXAM: PORTABLE CHEST 1 VIEW COMPARISON:  Chest x-ray from yesterday. FINDINGS: The right IJ central venous catheter tip is in the mid/distal SVC near the level of the carina. No complicating features. Stable cardiac enlargement, interstitial edema and right pleural effusion. IMPRESSION: Right IJ central venous catheter in good position without complicating features. Electronically Signed   By: Rudie Meyer M.D.   On: 05/28/2020 12:51   DG Chest Port 1 View  Result Date: 05/27/2020 CLINICAL DATA:  Dyspnea EXAM: PORTABLE CHEST 1 VIEW COMPARISON:  05/25/2020 FINDINGS: Cardiac shadow is enlarged but stable. Lungs are well aerated bilaterally. Minimal right costophrenic angle blunting is seen. This is stable from the prior exam. No new focal infiltrate is seen. No bony abnormality is noted. IMPRESSION: No change from the prior exam. Electronically Signed   By: Alcide Clever M.D.   On: 05/27/2020 15:44    Cardiac Studies   TTEcho 05/27/2020 1. Left ventricular ejection  fraction, by estimation, is 20 to 25%. The  left ventricle has severely decreased function. The left ventricle  demonstrates global hypokinesis. The left ventricular internal cavity size  was moderately dilated. Left  ventricular diastolic parameters are consistent with Grade II diastolic  dysfunction (pseudonormalization).  2. Right ventricular systolic function is mildly reduced. The right  ventricular size is moderately enlarged.  3. Left atrial size was mildly dilated.  4. Right atrial size was moderately dilated.  5. The mitral valve is abnormal. Moderate to severe mitral valve  regurgitation. No evidence of mitral stenosis.  6. Tricuspid valve regurgitation is moderate to severe.  7. The aortic valve is abnormal. Aortic valve regurgitation is moderate.  Mild aortic valve sclerosis is present, with no evidence of aortic valve  stenosis.  8. The inferior vena cava is normal in size with greater than 50%  respiratory variability, suggesting right atrial pressure of 3 mmHg.  9. Rounded opacity in the apex could be an organized thrombus. Consider  cardiac MRI for evaluation.   Patient Profile     70 y.o. male with nonobstructive CAD, nonischemic cardiomyopathy, EF 25%, moderate MR presenting with shortness of breath.  Being seen due to severely reduced ejection fraction and LV thrombus.  Assessment & Plan    1.  NICM, EF 25% -Currently off pressors/milrinone,  maintaining BP. -Low BP.  Take additional CHF medications at this time. -Net -611 cc -Keep patient net negative fluid balance.  2.  LV thrombus -On heparin drip  3.  Altered mental status -Etiology unclear  Overall prognosis remains guarded for this critically ill gentleman with multiorgan failure.  Critical care time was exclusive of separate billable procedures and treating other patients.  Critial care time was spent personally by me (independant of midlevel providers) on the following activities:  development of treatment plan,  evaluation of patients response to treatment, examining patient, reviewing treatments/ interventions, lab studies, radiographic studies, pulse ox.     The patient is critically ill with multiple organ systems failure and requires high complexity decision making for assessment and support.   Critical care was necessary to treat or prevent immintent or life-threatening deterioration.   Total CCT spent directly with the patient today is      Signed, Debbe Odea, MD  05/29/2020, 1:30 PM

## 2020-05-29 NOTE — Consult Note (Signed)
ANTICOAGULATION CONSULT NOTE  Pharmacy Consult for Heparin gtt Indication: LV Apical Thrombus  Allergies  Allergen Reactions  . Penicillins Other (See Comments)    Did it involve swelling of the face/tongue/throat, SOB, or low BP? No Did it involve sudden or severe rash/hives, skin peeling, or any reaction on the inside of your mouth or nose? No Did you need to seek medical attention at a hospital or doctor's office? No When did it last happen? If all above answers are "NO", may proceed with cephalosporin use.    Patient Measurements: Height: 6\' 1"  (185.4 cm) Weight: 61.8 kg (136 lb 3.9 oz) IBW/kg (Calculated) : 79.9 Heparin Dosing Weight: 62.1 kg  Vital Signs: Temp: 97.3 F (36.3 C) (05/18 0400) Temp Source: Axillary (05/18 0400) BP: 117/91 (05/18 0500) Pulse Rate: 105 (05/18 0200)  Labs: Recent Labs    05/26/20 0554 05/27/20 0412 05/27/20 1544 05/28/20 0155 05/28/20 1012 05/29/20 0424  HGB 13.3 13.4  --  12.1*  --  12.3*  HCT 39.3 39.3  --  34.8*  --  36.1*  PLT 67* 59*  --  54*  --  57*  APTT  --   --  31  --   --   --   HEPARINUNFRC  --   --   --  0.67 0.63 0.64  CREATININE 2.15* 2.69*  --   --  3.18* 2.50*  CKTOTAL 96  --   --   --   --   --     Estimated Creatinine Clearance: 23.7 mL/min (A) (by C-G formula based on SCr of 2.5 mg/dL (H)).   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  Pt's EF noted 20-25% on ECHO and LV Apical Thrombus noted. Patient with acute on chronic HFrEF/NICM complicated by cardiorenal syndrome. Milrinone infusion started and transfered to CCU. Pharmacy consulted for the management of heparin.  Thrombocytopenia at baseline.     Goal of Therapy:  Heparin level 0.3-0.7 units/ml Monitor platelets by anticoagulation protocol: Yes     Plan:  Heparin level therapeutic x 3 Continue heparin at 1000 units/hr.  Check HL and CBC with morning labs.  70yo, PharmD, [facility] 05/29/2020 5:31 AM

## 2020-05-29 NOTE — Progress Notes (Signed)
Discussed CT Soft Tissue Neck findings with ICU Intensivist Dr. Belia Heman agrees with discontinuing heparin gtt due to large right-sided neck hematoma. At this time does not recommend removing right internal jugular central line at this time, will consult vascular surgery in the am.  Sonda Rumble, AGNP  Pulmonary/Critical Care Pager [phone] (please enter 7 digits) PCCM Consult Pager [phone] (please enter 7 digits)

## 2020-05-29 NOTE — Consult Note (Addendum)
Consultation Note Date: 05/29/2020   Patient Name: Curtis Walters  DOB: 1950-01-27  MRN: [id number]  Age / Sex: 70 y.o., male  PCP: Patient, No Pcp Per (Inactive) Referring Physician: Enedina Finner, MD  Reason for Consultation: Establishing goals of care  HPI/Patient Profile: Has had diarrhea for a week. States he can't sleep and is not feeling right. States he usually only has one drink a day and had increased over past few days but does not know why.   Clinical Assessment and Goals of Care: Patient is resting in bed with mittens in place. He tries to speak, but unable to understand what he is saying. Spoke with daughter Elmarie Shiley. Tiffany states she has not really had a "normal"  relationship with him, but does talk with him on the phone. She states he is retired from [employer]. She states he has suffered from alcoholism for over 40 years.   She discusses family dynamics and states she has a half- brother Christiane Ha who is 12 years older than her. She states her father enjoys cooking, and is a very clean cut man. She states "he's hard headed" and likes to do things on his terms. Daughter shares he does not like healthare.   She states she has been updated on his status. Tiffany lives in [place] and [place] lives in [place]. Elmarie Shiley is trying to come here by Friday. She attempted to call Christiane Ha unsuccessfully; Per staff, son is supposed to be driving here today.   Regarding goals of care, she understands her father's status and would be in favor of DNR/DNI status, and discussion of a comfort focused approach to care. Decisions will need to be made with both children.      SUMMARY OF RECOMMENDATIONS   Full code/full scope. Per staff, son should be here today. Daughter states she will try to be here by Friday.    Prognosis:   < 6 months      Primary Diagnoses: Present on Admission: . Thrombocytopenia  (HCC) . Abnormal LFTs . Tobacco abuse . Lactic acidosis . SOB (shortness of breath)   I have reviewed the medical record, interviewed the patient and family, and examined the patient. The following aspects are pertinent.  Past Medical History:  Diagnosis Date  . Alcohol use   . Chronic back pain   . Coronary artery disease, non-occlusive   . Dilated aortic root (HCC)   . HFrEF (heart failure with reduced ejection fraction) (HCC)   . Hypertension   . NICM (nonischemic cardiomyopathy) (HCC)   . Tobacco abuse   . Transaminitis    Social History   Socioeconomic History  . Marital status: Single    Spouse name: Not on file  . Number of children: Not on file  . Years of education: Not on file  . Highest education level: Not on file  Occupational History  . Not on file  Tobacco Use  . Smoking status: Current Every Day Smoker    Types: Cigarettes  . Smokeless tobacco: Never Used  Substance and Sexual Activity  . Alcohol use: Yes    Alcohol/week: 35.0 standard drinks    Types: 14 Cans of beer, 21 Shots of liquor per week  . Drug use: Not Currently  . Sexual activity: Not Currently  Other Topics Concern  . Not on file  Social History Narrative  . Not on file   Social Determinants of Health   Financial Resource Strain: Not on file  Food Insecurity: Not on file  Transportation Needs: Not on file  Physical Activity: Not on file  Stress: Not on file  Social Connections: Not on file   Family History  Problem Relation Age of Onset  . Sickle cell anemia Sister    Scheduled Meds: . aspirin EC  81 mg Oral Daily  . Chlorhexidine Gluconate Cloth  6 each Topical Daily  . folic acid  1 mg Oral Daily  . multivitamin with minerals  1 tablet Oral Daily  . thiamine  100 mg Oral Daily   Continuous Infusions: . heparin 1,000 Units/hr (05/29/20 0600)   PRN Meds:.acetaminophen **OR** acetaminophen, ALPRAZolam, LORazepam **OR** LORazepam Medications Prior to Admission:  Prior  to Admission medications   Medication Sig Start Date End Date Taking? Authorizing Provider  metoprolol succinate (TOPROL XL) 25 MG 24 hr tablet Take 1 tablet (25 mg total) by mouth daily. Patient not taking: Reported on 05/25/2020 04/20/20 04/20/21  Jene Every, MD  torsemide (DEMADEX) 20 MG tablet Take 2 tablets (40 mg total) by mouth daily. 04/20/20 07/19/20  Jene Every, MD   Allergies  Allergen Reactions  . Penicillins Other (See Comments)    Did it involve swelling of the face/tongue/throat, SOB, or low BP? No Did it involve sudden or severe rash/hives, skin peeling, or any reaction on the inside of your mouth or nose? No Did you need to seek medical attention at a hospital or doctor's office? No When did it last happen? If all above answers are "NO", may proceed with cephalosporin use.   Review of Systems  Unable to perform ROS   Physical Exam Pulmonary:     Effort: Pulmonary effort is normal.  Skin:    General: Skin is warm and dry.  Neurological:     Mental Status: He is alert.     Comments: Mittens in place.      Vital Signs: BP (!) 125/91   Pulse 99   Temp (!) 97.3 F (36.3 C) (Axillary)   Resp 19   Ht 6\' 1"  (1.854 m)   Wt 61.8 kg   SpO2 100%   BMI 17.98 kg/m  Pain Scale: 0-10   Pain Score: Asleep   SpO2: SpO2: 100 % O2 Device:SpO2: 100 % O2 Flow Rate: .O2 Flow Rate (L/min): 3 L/min  IO: Intake/output summary:   Intake/Output Summary (Last 24 hours) at 05/29/2020 1323 Last data filed at 05/29/2020 0600 Gross per 24 hour  Intake 213.36 ml  Output 825 ml  Net -611.64 ml    LBM: Last BM Date: 05/26/20 Baseline Weight: Weight: 63.5 kg Most recent weight: Weight: 61.8 kg      Time In: 12:20 Time Out: 1:00 Time Total: 40 min Greater than 50%  of this time was spent counseling and coordinating care related to the above assessment and plan.  Signed by: 05/28/20, NP   Please contact Palliative Medicine Team phone at [phone] for  questions and concerns.  For individual provider: See [phone]

## 2020-05-29 NOTE — Progress Notes (Signed)
Notified pts daughter Curtis Walters regarding CT Soft Tissue Neck findings. Informed her due to the large right neck hematoma will need to stop heparin gtt, therefore pt is HIGH RISK for stroke.  Curtis Walters understands pts condition and was appreciative of the update.  She states she will arrive tomorrow night.  Will continue to monitor and assess pt.   Sonda Rumble, AGNP  Pulmonary/Critical Care Pager [phone] (please enter 7 digits) PCCM Consult Pager [phone] (please enter 7 digits)

## 2020-05-30 DIAGNOSIS — I5023 Acute on chronic systolic (congestive) heart failure: Secondary | ICD-10-CM

## 2020-05-30 DIAGNOSIS — R0602 Shortness of breath: Secondary | ICD-10-CM | POA: Diagnosis not present

## 2020-05-30 DIAGNOSIS — I428 Other cardiomyopathies: Secondary | ICD-10-CM

## 2020-05-30 DIAGNOSIS — T148XXA Other injury of unspecified body region, initial encounter: Secondary | ICD-10-CM

## 2020-05-30 DIAGNOSIS — N19 Unspecified kidney failure: Secondary | ICD-10-CM

## 2020-05-30 LAB — BASIC METABOLIC PANEL
Anion gap: 10 (ref 5–15)
BUN: 44 mg/dL — ABNORMAL HIGH (ref 8–23)
CO2: 21 mmol/L — ABNORMAL LOW (ref 22–32)
Calcium: 8.8 mg/dL — ABNORMAL LOW (ref 8.9–10.3)
Chloride: 110 mmol/L (ref 98–111)
Creatinine, Ser: 2 mg/dL — ABNORMAL HIGH (ref 0.61–1.24)
GFR, Estimated: 35 mL/min — ABNORMAL LOW (ref >60)
Glucose, Bld: 92 mg/dL (ref 70–99)
Potassium: 4.3 mmol/L (ref 3.5–5.1)
Sodium: 141 mmol/L (ref 135–145)

## 2020-05-30 LAB — CBC
HCT: 37.9 % — ABNORMAL LOW (ref 39.0–52.0)
Hemoglobin: 12.6 g/dL — ABNORMAL LOW (ref 13.0–17.0)
MCH: 32.7 pg (ref 26.0–34.0)
MCHC: 33.2 g/dL (ref 30.0–36.0)
MCV: 98.4 fL (ref 80.0–100.0)
Platelets: 60 10*3/uL — ABNORMAL LOW (ref 150–400)
RBC: 3.85 MIL/uL — ABNORMAL LOW (ref 4.22–5.81)
RDW: 18.1 % — ABNORMAL HIGH (ref 11.5–15.5)
WBC: 6.7 10*3/uL (ref 4.0–10.5)
nRBC: 0.6 % — ABNORMAL HIGH (ref 0.0–0.2)

## 2020-05-30 LAB — GLUCOSE, CAPILLARY
Glucose-Capillary: 94 mg/dL (ref 70–99)
Glucose-Capillary: 94 mg/dL (ref 70–99)
Glucose-Capillary: 79 mg/dL (ref 70–99)
Glucose-Capillary: 87 mg/dL (ref 70–99)

## 2020-05-30 LAB — MAGNESIUM: Magnesium: 2.1 mg/dL (ref 1.7–2.4)

## 2020-05-30 MED ORDER — HALOPERIDOL LACTATE 2 MG/ML PO CONC
0.5000 mg | ORAL | Status: DC | PRN
  Filled 2020-05-30: qty 0.3

## 2020-05-30 MED ORDER — ONDANSETRON 4 MG PO TBDP
4.0000 mg | ORAL_TABLET | Freq: Four times a day (QID) | ORAL | Status: DC | PRN
  Filled 2020-05-30: qty 1

## 2020-05-30 MED ORDER — HALOPERIDOL 0.5 MG PO TABS
0.5000 mg | ORAL_TABLET | ORAL | Status: DC | PRN
  Filled 2020-05-30: qty 1

## 2020-05-30 MED ORDER — HALOPERIDOL LACTATE 5 MG/ML IJ SOLN
0.5000 mg | INTRAMUSCULAR | Status: DC | PRN
  Administered 2020-05-30 – 2020-05-31 (×3): 0.5 mg via INTRAVENOUS
  Filled 2020-05-30 (×3): qty 1

## 2020-05-30 MED ORDER — GLYCOPYRROLATE 1 MG PO TABS
1.0000 mg | ORAL_TABLET | ORAL | Status: DC | PRN
  Filled 2020-05-30: qty 1

## 2020-05-30 MED ORDER — LORAZEPAM 1 MG PO TABS: 1.0000 mg | ORAL_TABLET | ORAL | Status: DC | PRN

## 2020-05-30 MED ORDER — LORAZEPAM 2 MG/ML IJ SOLN
1.0000 mg | INTRAMUSCULAR | Status: DC | PRN
  Administered 2020-05-30 – 2020-05-31 (×2): 2 mg via INTRAVENOUS
  Filled 2020-05-30 (×2): qty 1

## 2020-05-30 MED ORDER — GLYCOPYRROLATE 0.2 MG/ML IJ SOLN
0.2000 mg | INTRAMUSCULAR | Status: DC | PRN
  Administered 2020-06-01: 12:00:00 0.2 mg via SUBCUTANEOUS
  Filled 2020-05-30: qty 1

## 2020-05-30 MED ORDER — LORAZEPAM 2 MG/ML PO CONC: 1.0000 mg | ORAL | Status: DC | PRN

## 2020-05-30 MED ORDER — POLYVINYL ALCOHOL 1.4 % OP SOLN
1.0000 [drp] | Freq: Four times a day (QID) | OPHTHALMIC | Status: DC | PRN
  Filled 2020-05-30: qty 15

## 2020-05-30 MED ORDER — ONDANSETRON HCL 4 MG/2ML IJ SOLN: 4.0000 mg | Freq: Four times a day (QID) | INTRAMUSCULAR | Status: DC | PRN

## 2020-05-30 MED ORDER — MORPHINE SULFATE (PF) 2 MG/ML IV SOLN
1.0000 mg | INTRAVENOUS | Status: DC | PRN
  Administered 2020-05-31: 1 mg via INTRAVENOUS
  Administered 2020-05-31 (×2): 2 mg via INTRAVENOUS
  Filled 2020-05-30 (×3): qty 1

## 2020-05-30 MED ORDER — GLYCOPYRROLATE 0.2 MG/ML IJ SOLN
0.2000 mg | INTRAMUSCULAR | Status: DC | PRN
  Administered 2020-05-31: 03:00:00 0.2 mg via INTRAVENOUS
  Filled 2020-05-30: qty 1

## 2020-05-30 MED ORDER — BIOTENE DRY MOUTH MT LIQD: 15.0000 mL | OROMUCOSAL | Status: DC | PRN

## 2020-05-30 MED ORDER — LORAZEPAM 2 MG/ML IJ SOLN: 1.0000 mg | INTRAMUSCULAR | Status: DC | PRN

## 2020-05-30 NOTE — TOC Progression Note (Signed)
Transition of Care [facility]) - Progression Note    Patient Details  Name: Curtis Walters MRN: [id number] Date of Birth: [birth year]  Transition of Care [facility]) CM/SW Contact  Hetty Ely, RN Phone Number: 05/30/2020, 11:24 AM  Clinical Narrative:  Sherron Monday with Palliative, [place] who recommends Hospice Home for patient. Called [place], Authorace and she says she will process referral today and update TOC and Palliative. Boyd Kerbs states she will have Insurance authorization started and also call and discuss with family. TOC to continue to track.         Expected Discharge Plan and Services                                                 Social Determinants of Health (SDOH) Interventions    Readmission Risk Interventions No flowsheet data found.

## 2020-05-30 NOTE — Progress Notes (Addendum)
[facility] [place] NAME: Curtis Walters    MR#:  [id number]  DATE OF BIRTH:  [birth year]  SUBJECTIVE:   Events of last evening noted. Patient developed a larger right-sided hematoma being on heparin drip and low platelet. Heparin drip stopped. Family met with palliative care. Patient given very poor prognosis is now comfort care REVIEW OF SYSTEMS:   Review of Systems  Unable to perform ROS: Mental acuity   Tolerating Diet: Tolerating PT:   DRUG ALLERGIES:   Allergies  Allergen Reactions  . Penicillins Other (See Comments)    Did it involve swelling of the face/tongue/throat, SOB, or low BP? No Did it involve sudden or severe rash/hives, skin peeling, or any reaction on the inside of your mouth or nose? No Did you need to seek medical attention at a hospital or doctor's office? No When did it last happen? If all above answers are "NO", may proceed with cephalosporin use.    VITALS:  Blood pressure 128/90, pulse 100, temperature 97.9 F (36.6 C), temperature source Axillary, resp. rate 14, height '6\' 1"'  (1.854 m), weight 61.9 kg, SpO2 (!) 71 %.  PHYSICAL EXAMINATION:   Physical Examlimited comfort care only  GENERAL:  70 y.o.-year-old patient lying in the bed with no acute distress. Chronically ill deconditioned LUNGS: Normal breath sounds bilaterally, no wheezing, rales, rhonchi. No use of accessory muscles of respiration.    LABORATORY PANEL:  CBC Recent Labs  Lab 05/30/20 0330  WBC 6.7  HGB 12.6*  HCT 37.9*  PLT 60*    Chemistries  Recent Labs  Lab 05/26/20 0554 05/27/20 0412 05/30/20 0330  NA 139   < > 141  K 4.0   < > 4.3  CL 107   < > 110  CO2 20*   < > 21*  GLUCOSE 203*   < > 92  BUN 42*   < > 44*  CREATININE 2.15*   < > 2.00*  CALCIUM 8.9   < > 8.8*  MG  --    < > 2.1  AST 84*  --   --   ALT 60*  --   --   ALKPHOS 74  --   --   BILITOT 3.7*  --   --    < > = values in this interval not displayed.    Cardiac Enzymes No results for input(s): TROPONINI in the last 168 hours. RADIOLOGY:  CT SOFT TISSUE NECK WO CONTRAST  Result Date: 05/29/2020 CLINICAL DATA:  Right neck hematoma. EXAM: CT NECK WITHOUT CONTRAST TECHNIQUE: Multidetector CT imaging of the neck was performed following the standard protocol without intravenous contrast. COMPARISON:  None. FINDINGS: The study is motion degraded despite repeat imaging. Pharynx and larynx: No gross mass or swelling is identified within limitations of motion artifact and noncontrast technique. There is no retropharyngeal fluid. The airway is patent. Salivary glands: Suboptimal assessment of the right parotid and right submandibular glands due to streak artifact. Below described right neck hematoma extends into the parotid region. Grossly unremarkable left parotid and left submandibular glands. Thyroid: Unremarkable. Lymph nodes: Limited assessment for right-sided cervical lymphadenopathy due to the presence of the large hematoma, motion and streak artifact, and noncontrast technique. No enlarged lymph nodes identified in the left neck. Vascular: Moderate calcific atherosclerosis at the carotid bifurcations. Limited intracranial: Calcified atherosclerosis at the skull base. Visualized orbits: Unremarkable. Mastoids and visualized paranasal sinuses: Small mucous retention cyst in the right maxillary sinus.  Clear mastoid air cells. Skeleton: Advanced disc degeneration from C3-4 to C6-7. Advanced left facet arthrosis at C2-3 and C3-4. Upper chest: Aortic atherosclerosis. Moderate right and small left pleural effusions. Respiratory motion artifact with dependent atelectasis in the upper lobes. Other: There is a large right-sided neck hematoma which partially encircles the right internal jugular central venous catheter. The hematoma extends throughout essentially the entirety of the right lateral neck with inferior extension both anterior and posterior to the right  clavicle. The right sternocleidomastoid muscle is inseparable from the hematoma. There is no mass effect on the airway. IMPRESSION: 1. Large right-sided neck hematoma. 2. Right larger than left pleural effusions. 3. Aortic Atherosclerosis (ICD10-I70.0). Electronically Signed   By: Logan Bores M.D.   On: 05/29/2020 20:35   ASSESSMENT AND PLAN:  70/M with history of nonischemic cardiomyopathy, EF of 20%, mild pulmonary hypertension, moderate MR/TR, mild to moderate AAS, alcohol abuse, alcoholic liver disease, tobacco abuse, chronic back pain presented to the ED with dyspnea.,  He is a very poor historian with significant cognitive and memory deficits.  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 very small open area with a small amount of redness note surrounding the opena area (Active)  05/26/20 2100  Location:   Location Orientation: Right;Left (inner buttocks)  Staging: Stage 2 -  Partial thickness loss of dermis presenting as a shallow open injury with a red, pink wound bed without slough.  Wound Description (Comments): very small open area with a small amount of redness note surrounding the opena area  Present on Admission:         palliative care discussed with patient's family. Given overall multiple  comorbidities and poor prognosis family agreeable with comfort care.  Status is: Inpatient  Patient is transition to comfort care. Hospice referral made.                       TOTAL TIME TAKING CARE OF THIS PATIENT: 20 minutes.  >50% time spent on counselling and coordination of care  Note: This dictation was prepared with Dragon dictation along with smaller phrase technology. Any transcriptional errors that result from this process are unintentional.  Fritzi Mandes M.D    [facility]   CC: Primary care physician; Patient, No Pcp Per (Inactive)Patient ID: Curtis Walters, male   DOB: 07/23/1950, 70 y.o.   MRN: [id number]

## 2020-05-30 NOTE — Progress Notes (Addendum)
Daily Progress Note   Patient Name: Curtis Walters       Date: 05/30/2020 DOB: 11/16/1950  Age: 70 y.o. MRN#: [id number] Attending Physician: Fritzi Mandes, MD Primary Care Physician: Patient, No Pcp Per (Inactive) Admit Date: 05/25/2020  Reason for Consultation/Follow-up: Establishing goals of care  Subjective: Patient is resting in bed. His son is at bedside. Son's mother Curtis Walters is at bedside as well stating she is his wife and produces a marriage license.  Per conversation with them, they agree with Curtis Walters, that patient has 2 children Curtis Walters and Curtis Walters. Family dynamics discussed. Continued to redirect to what directly impacts the patient and his care moving forward. In regards to decision making, if Curtis Walters and the patient were no longer married, the decision makers would be both his children Curtis Walters and Curtis Walters.   We discussed his diagnoses, prognosis, GOC, EOL wishes disposition and options.  A detailed discussion was had today regarding advanced directives.  Concepts specific to code status, artifical feeding and hydration, IV antibiotics and rehospitalization were discussed.  The difference between an aggressive medical intervention path and a comfort care path was discussed.  Values and goals of care important to patient and family were attempted to be elicited.  Discussed limitations of medical interventions to prolong quality of life in some situations and discussed the concept of human mortality. Discussed multiple scenarios, none of which they believe he would be amenable to.   They state he is "a loner". 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 They state he is a person who wants to do things on his term and in his way. They state he will not want to make life style  modifications or see providers outpatient. Discussed his recent AMA.   Son and his mother would like to shift to comfort focused care. Spoke with daughter Curtis Walters. Curtis Walters has been updated and also would like to shift to comfort care for her father. She challenges her father and Curtis Walters continued marriage stating he has been married and divorced since [place] mother, and only has a significant other at this time. Discussed that she Garment/textile technologist) herself, brother Curtis Walters, and Curtis Walters are all in agreement with plans for comfort care moving forward, and so the plan would not change based on the decision maker, as everyone who could be the  decision maker are all in agreement with the same plan. She states she is renting a car and will come late this evening or tonight.  I completed a MOST form today and the signed original was placed in the chart. The form was completed and signed by both Curtis Walters and Curtis Walters. Curtis Walters is in agreement with contents of the MOST form document. This means all known possible decision makers agree to this plan and this form.  A photocopy was placed in the chart to be scanned into EMR. The patient outlined their wishes for the following treatment decisions:  Cardiopulmonary Resuscitation: Do Not Attempt Resuscitation (DNR/No CPR)  Medical Interventions: Comfort Measures: Keep clean, warm, and dry. Use medication by any route, positioning, wound care, and other measures to relieve pain and suffering. Use oxygen, suction and manual treatment of airway obstruction as needed for comfort. Do not transfer to the hospital unless comfort needs cannot be met in current location.  Antibiotics: No antibiotics (use other measures to relieve symptoms)  IV Fluids: No IV fluids (provide other measures to ensure comfort)  Feeding Tube: No feeding tube            Length of Stay: 5  Current Medications: Scheduled Meds:  . Chlorhexidine Gluconate Cloth  6 each Topical Daily  . mouth rinse  15  mL Mouth Rinse BID    Continuous Infusions:   PRN Meds: acetaminophen **OR** acetaminophen  Physical Exam Constitutional:      Comments: Eyes closed. Resting with mittens in place.   Pulmonary:     Effort: Pulmonary effort is normal.             Vital Signs: BP 120/88   Pulse (!) 102   Temp 98.2 F (36.8 C) (Axillary)   Resp 11   Ht _0  (1.854 m)   Wt 61.9 kg   SpO2 100%   BMI 18.00 kg/m  SpO2: SpO2: 100 % O2 Device: O2 Device: Nasal Cannula O2 Flow Rate: O2 Flow Rate (L/min): 3 L/min  Intake/output summary:   Intake/Output Summary (Last 24 hours) at 05/30/2020 1105 Last data filed at 05/30/2020 0600 Gross per 24 hour  Intake 139.91 ml  Output 950 ml  Net -810.09 ml   LBM: Last BM Date: 05/26/20 Baseline Weight: Weight: 63.5 kg Most recent weight: Weight: 61.9 kg         Patient Active Problem List   Diagnosis Date Noted  . NICM (nonischemic cardiomyopathy) ([place])   . LV (left ventricular) mural thrombus   . Pressure injury of skin 05/28/2020  . Lactic acidosis 05/25/2020  . SOB (shortness of breath) 05/25/2020  . Acute exacerbation of CHF (congestive heart failure) ([place]) 08/26/2019  . Abnormal LFTs 08/25/2019  . Thrombocytopenia ([place]) 08/25/2019  . Sinus tachycardia 08/25/2019  . Alcohol use 08/25/2019  . Acute on chronic HFrEF (heart failure with reduced ejection fraction) ([place]) 08/25/2019  . Dilated cardiomyopathy ([place]) 08/25/2019  . Tobacco abuse   . Elevated troponin     Palliative Care Assessment & Plan    Recommendations/Plan:  Shifting to comfort care. Hospice facility recommended.    Code Status: Code Status History    Date Active Date Inactive Code Status Order ID Comments User Context   05/25/2020 1747 05/30/2020 1057 Full Code [id number]  Para Skeans, MD ED   08/25/2019 1613 08/29/2019 1639 Full Code [id number]  Ivor Costa, MD ED   Advance Care Planning Activity    Questions for Most Recent Historical Code Status (Order  [id number])        Prognosis:  < 2 weeks  CHF EF 20-25%. LV thrombus. Hematoma at central line access necessitating stopping Heparin infusion. AKI on CKD. Chronic ETOH use with liver disease.     Care plan was discussed with primary team, CCM, RN, TOC.   Thank you for allowing the Palliative Medicine Team to assist in the care of this patient.   Time In: 9:50 Time Out: 11:00 Total Time 70 min Prolonged Time Billed  no      Greater than 50%  of this time was spent counseling and coordinating care related to the above assessment and plan.  Asencion Gowda, NP  Please contact Palliative Medicine Team phone at [phone] for questions and concerns.

## 2020-05-30 NOTE — Progress Notes (Signed)
NAME:  Curtis Walters, MRN:  [id number], DOB:  1950-04-30, LOS: 5 ADMISSION DATE:  05/25/2020, CONSULTATION DATE:  05/28/2020 REFERRING MD:  Dr. Okey Dupre, CHIEF COMPLAINT:  Shortness of breath  Brief Pt Description/Synopsis:  70 year old male with past medical history significant for nonischemic cardiomyopathy, EF of 20%, mild pulmonary hypertension, moderate MR/TR, mild to moderate AAS, alcohol abuse, alcoholic liver disease, tobacco abuse, chronic back pain presented to the ED with dyspnea.  Found to have Acute on Chronic HFrEF, AKI on CKD Stage III and suspected Cardiorenal Syndrome now requiring Milrinone infusion.  History of Present Illness:  Curtis Walters is a 70 year old male with a significant past medical history as listed below who presented to [facility] ED on 05/25/2020 due to complaints of shortness of breath. In the emergency room labs were notable for creatinine of 2.0, mildly elevated LFTs, platelet of 75, lactate of 4.6, BNP greater than 4500, CTA without PE, noted ascites, hepatic steatosis, emphysema, moderate right pleural effusion  He was admitted to the MedSurg unit by the hospitalist for further work-up and treatment of acute decompensated HFrEF (EF 20-25%) and AKI on CKD 3.  Trial of IV diuresis was attempted, however was stopped due to worsening renal failure.  Concern for Cardiorenal syndrome.  He was transferred to ICU on 05/27/20 by Cardiology for initiation of Milrinone infusion.  Central line placed on 5/17/212.  PCCM, Nephrology, and Palliative Care have been consulted for assistance in management.  Pertinent  Medical History  nonischemic cardiomyopathy HFrEF (EF of 20%) Coronary Artery Disease Pulmonary hypertension Moderate Mitral & Tricuspid Valve Regurgitation Hypertension Dilated aortic root alcohol abuse alcoholic liver disease tobacco abuse chronic back pain   Significant Hospital Events: Including procedures, antibiotic start and stop dates in addition to other  pertinent events   . 05/25/20: Admitted to Hospital by Hospitalist . 05/27/20: Concern for Cardiorenal syndrome, transferred to ICU for Milrinone infusion . 05/28/20: PCCM consulted for central line placement.  Palliative Care and Nephrology consulted . 05/29/20: Remains encephalopathic,required ativan overnight due to agitation, creatinine slightly improved to 2.5 . 5/19 CT shows large neck hematoma, heparin stopped  Interim History / Subjective:  Increased WOB comatose state Critically ill   Objective   Blood pressure 120/88, pulse (!) 102, temperature 98.2 F (36.8 C), temperature source Axillary, resp. rate 11, height 6\' 1"  (1.854 m), weight 61.9 kg, SpO2 100 %. CVP:  [0 mmHg-38 mmHg] 0 mmHg  FiO2 (%):  [32 %] 32 %   Intake/Output Summary (Last 24 hours) at 05/30/2020 1037 Last data filed at 05/30/2020 0600 Gross per 24 hour  Intake 139.91 ml  Output 950 ml  Net -810.09 ml   Filed Weights   05/28/20 0440 05/29/20 0414 05/30/20 0200  Weight: 64 kg 61.8 kg 61.9 kg   REVIEW OF SYSTEMS  PATIENT IS UNABLE TO PROVIDE COMPLETE REVIEW OF SYSTEMS DUE TO SEVERE CRITICAL ILLNESS AND TOXIC METABOLIC ENCEPHALOPATHY    PHYSICAL EXAMINATION:  GENERAL:critically ill appearing, +resp distress HEAD: Normocephalic, atraumatic.  EYES: Pupils equal, round, reactive to light.  No scleral icterus.  MOUTH: Moist mucosal membrane. NECK: Supple. No thyromegaly. No nodules. No JVD.  PULMONARY: +rhonchi, +wheezing CARDIOVASCULAR: S1 and S2. Regular rate and rhythm. No murmurs, rubs, or gallops.  GASTROINTESTINAL: Soft, nontender, -distended. Positive bowel sounds.  MUSCULOSKELETAL: No swelling, clubbing, or edema.  NEUROLOGIC: obtunded SKIN:intact,warm,dry    Labs/imaging that I havepersonally reviewed  (right click and "Reselect all SmartList Selections" daily)  Labs 05/29/2020: BUN 52, Cr. 2.5, Hgb 12.3, Hct  36.1, platelets 57 Chest x-ray 05/28/2020:The right IJ central venous catheter tip  is in the mid/distal SVC near the level of the carina. No complicating features.Stable cardiac enlargement, interstitial edema and right pleural effusion.  Resolved Hospital Problem list     Assessment & Plan:   Patient with End stage cardiomyopathy with severe sCHF with LV thrombus with acute and severe agitation due to DT's    Prognosis is very poor.     Acute on Chronic HFrEF (LVEF 20-25%) Left Ventricular Apical Thrombus Mildly elevated Troponin, suspect demand ischemia Heparin had to be stopped due to spontantaneaous bleeding in neck  High risk for cardiac arrest an death   AKI on CKD Stage III Suspect Cardiorenal Syndrome Prognosis is very poor  Alcoholic Liver Disease Superimposed Hepatic congestion Ascites End stage liver disease    NEUROLOGY ACUTE TOXIC METABOLIC ENCEPHALOPATHY Due to DT's      Best practice (right click and "Reselect all SmartList Selections" daily)  Diet:  NPO Pain/Anxiety/Delirium protocol (if indicated): No VAP protocol (if indicated): Not indicated DVT prophylaxis: Systemic AC GI prophylaxis: PPI Glucose control:  SSI No Central venous access: central line placed for Milrinone Arterial line:  N/A Foley:  Yes and is still needed Mobility:  bed rest  PT consulted: Yes Last date of multidisciplinary goals of care discussion [05/29/2020] Code Status:  full code Disposition: ICU   Labs   CBC: Recent Labs  Lab 05/25/20 1146 05/26/20 0554 05/27/20 0412 05/28/20 0155 05/29/20 0424 05/29/20 2133 05/30/20 0330  WBC 6.0 3.1* 8.3 9.2 7.5  --  6.7  NEUTROABS 3.9  --   --   --   --   --   --   HGB 13.9 13.3 13.4 12.1* 12.3* 12.9* 12.6*  HCT 41.5 39.3 39.3 34.8* 36.1* 39.9 37.9*  MCV 96.7 96.1 97.3 96.7 97.8  --  98.4  PLT 75* 67* 59* 54* 57*  --  60*    Basic Metabolic Panel: Recent Labs  Lab 05/25/20 2315 05/26/20 0554 05/27/20 0412 05/28/20 0155 05/28/20 1012 05/29/20 0424 05/30/20 0330  NA  --  139 136  --  136  140 141  K  --  4.0 4.6  --  4.7 3.9 4.3  CL  --  107 105  --  105 109 110  CO2  --  20* 19*  --  21* 22 21*  GLUCOSE  --  203* 133*  --  73 71 92  BUN  --  42* 45*  --  55* 52* 44*  CREATININE  --  2.15* 2.69*  --  3.18* 2.50* 2.00*  CALCIUM  --  8.9 8.7*  --  8.5* 8.6* 8.8*  MG 1.8  --   --  1.8  --  2.1 2.1  PHOS  --   --   --  4.2  --  3.3  --    GFR: Estimated Creatinine Clearance: 29.7 mL/min (A) (by C-G formula based on SCr of 2 mg/dL (H)). Recent Labs  Lab 05/25/20 1146 05/25/20 1343 05/25/20 1927 05/25/20 1946 05/25/20 2315 05/26/20 0554 05/27/20 0412 05/28/20 0155 05/29/20 0424 05/30/20 0330  PROCALCITON  --   --   --  0.20  --   --   --   --   --   --   WBC 6.0  --   --   --   --    < > 8.3 9.2 7.5 6.7  LATICACIDVEN 4.6* 4.0* 5.9*  --  4.5*  --   --   --   --   --    < > =  values in this interval not displayed.    Liver Function Tests: Recent Labs  Lab 05/25/20 1146 05/26/20 0554  AST 85* 84*  ALT 60* 60*  ALKPHOS 78 74  BILITOT 4.1* 3.7*  PROT 6.9 6.2*  ALBUMIN 3.2* 3.0*   No results for input(s): LIPASE, AMYLASE in the last 168 hours. No results for input(s): AMMONIA in the last 168 hours.  ABG    Component Value Date/Time   PHART 7.46 (H) 05/25/2020 2247   PCO2ART 30 (L) 05/25/2020 2247   PO2ART 85 05/25/2020 2247   HCO3 24.8 05/28/2020 1820   ACIDBASEDEF 0.4 05/28/2020 1820   O2SAT 82.5 05/28/2020 1820     Coagulation Profile: Recent Labs  Lab 05/25/20 2315  INR 1.6*    Cardiac Enzymes: Recent Labs  Lab 05/26/20 0554  CKTOTAL 96    HbA1C: Hgb A1c MFr Bld  Date/Time Value Ref Range Status  05/26/2020 05:54 AM 5.3 4.8 - 5.6 % Final    Comment:    (NOTE) Pre diabetes:          5.7%-6.4%  Diabetes:              >6.4%  Glycemic control for   <7.0% adults with diabetes   08/26/2019 06:07 AM 4.9 4.8 - 5.6 % Final    Comment:    (NOTE) Pre diabetes:          5.7%-6.4%  Diabetes:              >6.4%  Glycemic control for    <7.0% adults with diabetes     CBG: Recent Labs  Lab 05/29/20 2303 05/30/20 0106 05/30/20 0330 05/30/20 0616 05/30/20 0741  GLUCAP 100* 94 94 79 87      DVT/GI PRX  assessed I Assessed the need for Labs I Assessed the need for Foley I Assessed the need for Central Venous Line Family Discussion when available I Assessed the need for Mobilization I made an Assessment of medications to be adjusted accordingly Safety Risk assessment completed  CASE DISCUSSED IN MULTIDISCIPLINARY ROUNDS WITH ICU TEAM     Critical Care Time devoted to patient care services described in this note is 60  minutes.  Critical care was necessary to treat /prevent imminent and life-threatening deterioration. Overall, patient is critically ill, prognosis is guarded.  Patient with Multiorgan failure and at high risk for cardiac arrest and death.   Recommend DNR status and comfort care measures  Lucie Leather, M.D.  [facility]  Medical Director [facility] Medical Director [facility] Cardio-Pulmonary Department

## 2020-05-30 NOTE — Progress Notes (Signed)
Son took patient's belongings home. His jacket, car keys and cash from security.

## 2020-05-30 NOTE — Progress Notes (Incomplete)
Progress Note  Patient Name: Curtis Walters Date of Encounter: 05/30/2020  [facility] Cardiologist: Julien Nordmann, MD   Subjective   Neck hematoma noted and CT showed spontaneous neck bleed, IV heparin was stopped.   Inpatient Medications    Scheduled Meds: . Chlorhexidine Gluconate Cloth  6 each Topical Daily  . mouth rinse  15 mL Mouth Rinse BID   Continuous Infusions:  PRN Meds: acetaminophen **OR** acetaminophen   Vital Signs    Vitals:   05/30/20 0700 05/30/20 0800 05/30/20 0900 05/30/20 1000  BP: (!) 130/98 113/85 (!) 118/94 120/88  Pulse: (!) 113 98 (!) 103 (!) 102  Resp: 20 11 12 11   Temp:      TempSrc:      SpO2: 100% 100% 100% 100%  Weight:      Height:        Intake/Output Summary (Last 24 hours) at 05/30/2020 1134 Last data filed at 05/30/2020 0600 Gross per 24 hour  Intake 139.91 ml  Output 950 ml  Net -810.09 ml   Last 3 Weights 05/30/2020 05/29/2020 05/28/2020  Weight (lbs) 136 lb 7.4 oz 136 lb 3.9 oz 141 lb 1.5 oz  Weight (kg) 61.9 kg 61.8 kg 64 kg      Telemetry    *** - Personally Reviewed  ECG    *** - Personally Reviewed  Physical Exam  *** GEN: No acute distress.   Neck: No JVD Cardiac: RRR, no murmurs, rubs, or gallops.  Respiratory: Clear to auscultation bilaterally. GI: Soft, nontender, non-distended  MS: No edema; No deformity. Neuro:  Nonfocal  Psych: Normal affect   Labs    High Sensitivity Troponin:   Recent Labs  Lab 05/25/20 1147 05/25/20 1624 05/25/20 1754 05/25/20 2217  TROPONINIHS 64* 79* 80* 67*      Chemistry Recent Labs  Lab 05/25/20 1146 05/26/20 0554 05/27/20 0412 05/28/20 1012 05/29/20 0424 05/30/20 0330  NA 138 139   < > 136 140 141  K 3.7 4.0   < > 4.7 3.9 4.3  CL 105 107   < > 105 109 110  CO2 19* 20*   < > 21* 22 21*  GLUCOSE 87 203*   < > 73 71 92  BUN 41* 42*   < > 55* 52* 44*  CREATININE 2.03* 2.15*   < > 3.18* 2.50* 2.00*  CALCIUM 9.2 8.9   < > 8.5* 8.6* 8.8*  PROT 6.9 6.2*   --   --   --   --   ALBUMIN 3.2* 3.0*  --   --   --   --   AST 85* 84*  --   --   --   --   ALT 60* 60*  --   --   --   --   ALKPHOS 78 74  --   --   --   --   BILITOT 4.1* 3.7*  --   --   --   --   GFRNONAA 34* 32*   < > 20* 27* 35*  ANIONGAP 14 12   < > 10 9 10    < > = values in this interval not displayed.     Hematology Recent Labs  Lab 05/28/20 0155 05/29/20 0424 05/29/20 2133 05/30/20 0330  WBC 9.2 7.5  --  6.7  RBC 3.60* 3.69*  --  3.85*  HGB 12.1* 12.3* 12.9* 12.6*  HCT 34.8* 36.1* 39.9 37.9*  MCV 96.7 97.8  --  98.4  MCH 33.6 33.3  --  32.7  MCHC 34.8 34.1  --  33.2  RDW 17.9* 17.8*  --  18.1*  PLT 54* 57*  --  60*    BNP Recent Labs  Lab 05/25/20 2315  BNP >4,500.0*     DDimer No results for input(s): DDIMER in the last 168 hours.   Radiology    CT SOFT TISSUE NECK WO CONTRAST  Result Date: 05/29/2020 CLINICAL DATA:  Right neck hematoma. EXAM: CT NECK WITHOUT CONTRAST TECHNIQUE: Multidetector CT imaging of the neck was performed following the standard protocol without intravenous contrast. COMPARISON:  None. FINDINGS: The study is motion degraded despite repeat imaging. Pharynx and larynx: No gross mass or swelling is identified within limitations of motion artifact and noncontrast technique. There is no retropharyngeal fluid. The airway is patent. Salivary glands: Suboptimal assessment of the right parotid and right submandibular glands due to streak artifact. Below described right neck hematoma extends into the parotid region. Grossly unremarkable left parotid and left submandibular glands. Thyroid: Unremarkable. Lymph nodes: Limited assessment for right-sided cervical lymphadenopathy due to the presence of the large hematoma, motion and streak artifact, and noncontrast technique. No enlarged lymph nodes identified in the left neck. Vascular: Moderate calcific atherosclerosis at the carotid bifurcations. Limited intracranial: Calcified atherosclerosis at the  skull base. Visualized orbits: Unremarkable. Mastoids and visualized paranasal sinuses: Small mucous retention cyst in the right maxillary sinus. Clear mastoid air cells. Skeleton: Advanced disc degeneration from C3-4 to C6-7. Advanced left facet arthrosis at C2-3 and C3-4. Upper chest: Aortic atherosclerosis. Moderate right and small left pleural effusions. Respiratory motion artifact with dependent atelectasis in the upper lobes. Other: There is a large right-sided neck hematoma which partially encircles the right internal jugular central venous catheter. The hematoma extends throughout essentially the entirety of the right lateral neck with inferior extension both anterior and posterior to the right clavicle. The right sternocleidomastoid muscle is inseparable from the hematoma. There is no mass effect on the airway. IMPRESSION: 1. Large right-sided neck hematoma. 2. Right larger than left pleural effusions. 3. Aortic Atherosclerosis (ICD10-I70.0). Electronically Signed   By: Sebastian Ache M.D.   On: 05/29/2020 20:35   DG Chest Port 1 View  Result Date: 05/28/2020 CLINICAL DATA:  Central line placement. EXAM: PORTABLE CHEST 1 VIEW COMPARISON:  Chest x-ray from yesterday. FINDINGS: The right IJ central venous catheter tip is in the mid/distal SVC near the level of the carina. No complicating features. Stable cardiac enlargement, interstitial edema and right pleural effusion. IMPRESSION: Right IJ central venous catheter in good position without complicating features. Electronically Signed   By: Rudie Meyer M.D.   On: 05/28/2020 12:51    Cardiac Studies    TTEcho 05/27/2020 1. Left ventricular ejection fraction, by estimation, is 20 to 25%. The  left ventricle has severely decreased function. The left ventricle  demonstrates global hypokinesis. The left ventricular internal cavity size  was moderately dilated. Left  ventricular diastolic parameters are consistent with Grade II diastolic  dysfunction  (pseudonormalization).  2. Right ventricular systolic function is mildly reduced. The right  ventricular size is moderately enlarged.  3. Left atrial size was mildly dilated.  4. Right atrial size was moderately dilated.  5. The mitral valve is abnormal. Moderate to severe mitral valve  regurgitation. No evidence of mitral stenosis.  6. Tricuspid valve regurgitation is moderate to severe.  7. The aortic valve is abnormal. Aortic valve regurgitation is moderate.  Mild aortic valve sclerosis is present, with  no evidence of aortic valve  stenosis.  8. The inferior vena cava is normal in size with greater than 50%  respiratory variability, suggesting right atrial pressure of 3 mmHg.  9. Rounded opacity in the apex could be an organized thrombus. Consider  cardiac MRI for evaluation.   Patient Profile     70 y.o. male with pmh of nonobstructive CAD, NICM, EF 25%, mod MR who presented with SOB, being seen due to reduced EF and LV thrombus.  Assessment & Plan    NICM Rt pleural effusion Ascites - EF found to be 25%, unclear etiology for CM, there was concern for noncompliance with medications - started on IV lasix, but creatinine worsened and patient was transferred to the ICU 5/16 on IV milrinone - Received IVF and creatinine improved, now off -   LV thrombus - was on IV heparin>>was held for neck hematoma  AMS with underlying cognitive decline - unclear etiology  AKI on CKD stage 3 - creatinine baseline around 1.4 - creatinine improving, 2 today  Alcohol liver disease Thrombocytopenia -  IV heparin held   Disposition - patient is critically ill with overall poor prognosis - palliative care following  For questions or updates, please contact [facility] Please consult [email or web address] for contact info under        Signed, Shayleen Eppinger David Stall, PA-C  05/30/2020, 11:34 AM

## 2020-05-30 NOTE — Progress Notes (Signed)
GOALS OF CARE DISCUSSION  The Clinical status was relayed to family in detail. Son at bedside Updated and notified of patients medical condition.    Patient remains unresponsive and will not open eyes to command.   Patient is having a weak cough and struggling to remove secretions.   Patient with increased WOB and using accessory muscles to breathe Patient is suffering and in the dying process Explained to family course of therapy and the modalities    Patient with Progressive multiorgan failure with a very high probablity of a very minimal chance of meaningful recovery despite all aggressive and optimal medical therapy.   Family understands the situation.  They have consented and agreed to DNR/DNI and would like to proceed with Comfort care measures.  Family are satisfied with Plan of action and management. All questions answered  Additional CC time 35 mins   Adylee Leonardo Santiago Glad, M.D.  [facility]  Medical Director [facility] Medical Director [facility] Cardio-Pulmonary Department

## 2020-05-30 NOTE — Progress Notes (Signed)
[facility]  ROUNDING NOTE   Subjective:   Curtis Walters is a 70 y.o.  male with , who was admitted to [facility] on 05/25/2020 for acute on chronic CHF. Precious medical concerns include chronic back pain, CAD, AAA, reduced EF heart failure, tobacco abuse, and hypertension. He presented to the ED with shortness of breath.  Patient seen resting in bed Unarousable Son and ex-wife at bedside  Foley  Objective:  Vital signs in last 24 hours:  Temp:  [97.9 F (36.6 C)-98.4 F (36.9 C)] 97.9 F (36.6 C) (05/19 0800) Pulse Rate:  [92-115] 100 (05/19 1300) Resp:  [11-24] 14 (05/19 1300) BP: (113-135)/(81-101) 128/90 (05/19 1300) SpO2:  [71 %-100 %] 71 % (05/19 1300) FiO2 (%):  [32 %] 32 % (05/19 0400) Weight:  [61.9 kg] 61.9 kg (05/19 0200)  Weight change: 0.1 kg Filed Weights   05/28/20 0440 05/29/20 0414 05/30/20 0200  Weight: 64 kg 61.8 kg 61.9 kg    Intake/Output: I/O last 3 completed shifts: In: 249.6 [I.V.:249.6] Out: 1350 [Urine:1350]   Intake/Output this shift:  No intake/output data recorded.  Physical Exam: General: NAD, laying in bed  Head: Normocephalic, atraumatic. Moist oral mucosal   Eyes: Anicteric  Lungs:  Rhonchi, normal breathing effort, 3L [employer]  Heart: Regular rate and rhythm  Abdomen:  Soft, nontender  Extremities:  no peripheral edema.  Neurologic: Unarousable   Skin: No lesions    Basic Metabolic Panel: Recent Labs  Lab 05/25/20 2315 05/26/20 0554 05/27/20 0412 05/28/20 0155 05/28/20 1012 05/29/20 0424 05/30/20 0330  NA  --  139 136  --  136 140 141  K  --  4.0 4.6  --  4.7 3.9 4.3  CL  --  107 105  --  105 109 110  CO2  --  20* 19*  --  21* 22 21*  GLUCOSE  --  203* 133*  --  73 71 92  BUN  --  42* 45*  --  55* 52* 44*  CREATININE  --  2.15* 2.69*  --  3.18* 2.50* 2.00*  CALCIUM  --  8.9 8.7*  --  8.5* 8.6* 8.8*  MG 1.8  --   --  1.8  --  2.1 2.1  PHOS  --   --   --  4.2  --  3.3  --     Liver Function Tests: Recent Labs   Lab 05/25/20 1146 05/26/20 0554  AST 85* 84*  ALT 60* 60*  ALKPHOS 78 74  BILITOT 4.1* 3.7*  PROT 6.9 6.2*  ALBUMIN 3.2* 3.0*   No results for input(s): LIPASE, AMYLASE in the last 168 hours. No results for input(s): AMMONIA in the last 168 hours.  CBC: Recent Labs  Lab 05/25/20 1146 05/26/20 0554 05/27/20 0412 05/28/20 0155 05/29/20 0424 05/29/20 2133 05/30/20 0330  WBC 6.0 3.1* 8.3 9.2 7.5  --  6.7  NEUTROABS 3.9  --   --   --   --   --   --   HGB 13.9 13.3 13.4 12.1* 12.3* 12.9* 12.6*  HCT 41.5 39.3 39.3 34.8* 36.1* 39.9 37.9*  MCV 96.7 96.1 97.3 96.7 97.8  --  98.4  PLT 75* 67* 59* 54* 57*  --  60*    Cardiac Enzymes: Recent Labs  Lab 05/26/20 0554  CKTOTAL 96    BNP: Invalid input(s): POCBNP  CBG: Recent Labs  Lab 05/29/20 2303 05/30/20 0106 05/30/20 0330 05/30/20 0616 05/30/20 0741  GLUCAP 100* 94  94 79 87    Microbiology: Results for orders placed or performed during the hospital encounter of 05/25/20  Blood culture (single)     Status: Abnormal   Collection Time: 05/25/20  3:16 PM   Specimen: BLOOD  Result Value Ref Range Status   Specimen Description   Final    BLOOD Blood Culture adequate volume Performed at [facility], [address] [place], [place] [address]    Special Requests   Final    BOTTLES DRAWN AEROBIC AND ANAEROBIC BLOOD RIGHT FOREARM Performed at [facility], [address], [place], [place] [address]    Culture  Setup Time   Final    GRAM POSITIVE COCCI AEROBIC BOTTLE ONLY CRITICAL RESULT CALLED TO, READ BACK BY AND VERIFIED WITH: JASON ROBBINS @0342  05/27/20    Culture (A)  Final    FACKLAMIA HOMINIS Standardized susceptibility testing for this organism is not available. Performed at [facility], [address], [place], [place]    Report Status 05/29/2020 FINAL  Final  Blood Culture ID Panel (Reflexed)     Status: None   Collection Time: 05/25/20  3:16 PM  Result Value  Ref Range Status   Enterococcus faecalis NOT DETECTED NOT DETECTED Final   Enterococcus Faecium NOT DETECTED NOT DETECTED Final   Listeria monocytogenes NOT DETECTED NOT DETECTED Final   Staphylococcus species NOT DETECTED NOT DETECTED Final   Staphylococcus aureus (BCID) NOT DETECTED NOT DETECTED Final   Staphylococcus epidermidis NOT DETECTED NOT DETECTED Final   Staphylococcus lugdunensis NOT DETECTED NOT DETECTED Final   Streptococcus species NOT DETECTED NOT DETECTED Final   Streptococcus agalactiae NOT DETECTED NOT DETECTED Final   Streptococcus pneumoniae NOT DETECTED NOT DETECTED Final   Streptococcus pyogenes NOT DETECTED NOT DETECTED Final   A.calcoaceticus-baumannii NOT DETECTED NOT DETECTED Final   Bacteroides fragilis NOT DETECTED NOT DETECTED Final   Enterobacterales NOT DETECTED NOT DETECTED Final   Enterobacter cloacae complex NOT DETECTED NOT DETECTED Final   Escherichia coli NOT DETECTED NOT DETECTED Final   Klebsiella aerogenes NOT DETECTED NOT DETECTED Final   Klebsiella oxytoca NOT DETECTED NOT DETECTED Final   Klebsiella pneumoniae NOT DETECTED NOT DETECTED Final   Proteus species NOT DETECTED NOT DETECTED Final   Salmonella species NOT DETECTED NOT DETECTED Final   Serratia marcescens NOT DETECTED NOT DETECTED Final   Haemophilus influenzae NOT DETECTED NOT DETECTED Final   Neisseria meningitidis NOT DETECTED NOT DETECTED Final   Pseudomonas aeruginosa NOT DETECTED NOT DETECTED Final   Stenotrophomonas maltophilia NOT DETECTED NOT DETECTED Final   Candida albicans NOT DETECTED NOT DETECTED Final   Candida auris NOT DETECTED NOT DETECTED Final   Candida glabrata NOT DETECTED NOT DETECTED Final   Candida krusei NOT DETECTED NOT DETECTED Final   Candida parapsilosis NOT DETECTED NOT DETECTED Final   Candida tropicalis NOT DETECTED NOT DETECTED Final   Cryptococcus neoformans/gattii NOT DETECTED NOT DETECTED Final    Comment: Performed at [facility],  [address] [place], [place]  SARS CORONAVIRUS 2 (TAT 6-24 HRS) Nasopharyngeal Nasopharyngeal Swab     Status: None   Collection Time: 05/25/20  3:21 PM   Specimen: Nasopharyngeal Swab  Result Value Ref Range Status   SARS Coronavirus 2 NEGATIVE NEGATIVE Final    Comment: (NOTE) SARS-CoV-2 target nucleic acids are NOT DETECTED.  The SARS-CoV-2 RNA is generally detectable in upper and lower respiratory specimens during the acute phase of infection. Negative results do not  preclude SARS-CoV-2 infection, do not rule out co-infections with other pathogens, and should not be used as the sole basis for treatment or other patient management decisions. Negative results must be combined with clinical observations, patient history, and epidemiological information. The expected result is Negative.  Fact Sheet for Patients: HairSlick.no  Fact Sheet for Healthcare Providers: quierodirigir.com  This test is not yet approved or cleared by the [place] FDA and  has been authorized for detection and/or diagnosis of SARS-CoV-2 by FDA under an Emergency Use Authorization (EUA). This EUA will remain  in effect (meaning this test can be used) for the duration of the COVID-19 declaration under Se ction 564(b)(1) of the Act, 21 U.S.C. section 360bbb-3(b)(1), unless the authorization is terminated or revoked sooner.  Performed at [facility], [address], [place], [place] [address]   MRSA PCR Screening     Status: None   Collection Time: 05/26/20 12:10 AM   Specimen: Nasal Mucosa; Nasopharyngeal  Result Value Ref Range Status   MRSA by PCR NEGATIVE NEGATIVE Final    Comment:        The GeneXpert MRSA Assay (FDA approved for NASAL specimens only), is one component of a comprehensive MRSA colonization surveillance program. It is not intended to diagnose MRSA infection nor to guide or monitor treatment for MRSA  infections. Performed at [facility], [address] [place], [place] [address]     Coagulation Studies: No results for input(s): LABPROT, INR in the last 72 hours.  Urinalysis: No results for input(s): COLORURINE, LABSPEC, PHURINE, GLUCOSEU, HGBUR, BILIRUBINUR, KETONESUR, PROTEINUR, UROBILINOGEN, NITRITE, LEUKOCYTESUR in the last 72 hours.  Invalid input(s): APPERANCEUR    Imaging: CT SOFT TISSUE NECK WO CONTRAST  Result Date: 05/29/2020 CLINICAL DATA:  Right neck hematoma. EXAM: CT NECK WITHOUT CONTRAST TECHNIQUE: Multidetector CT imaging of the neck was performed following the standard protocol without intravenous contrast. COMPARISON:  None. FINDINGS: The study is motion degraded despite repeat imaging. Pharynx and larynx: No gross mass or swelling is identified within limitations of motion artifact and noncontrast technique. There is no retropharyngeal fluid. The airway is patent. Salivary glands: Suboptimal assessment of the right parotid and right submandibular glands due to streak artifact. Below described right neck hematoma extends into the parotid region. Grossly unremarkable left parotid and left submandibular glands. Thyroid: Unremarkable. Lymph nodes: Limited assessment for right-sided cervical lymphadenopathy due to the presence of the large hematoma, motion and streak artifact, and noncontrast technique. No enlarged lymph nodes identified in the left neck. Vascular: Moderate calcific atherosclerosis at the carotid bifurcations. Limited intracranial: Calcified atherosclerosis at the skull base. Visualized orbits: Unremarkable. Mastoids and visualized paranasal sinuses: Small mucous retention cyst in the right maxillary sinus. Clear mastoid air cells. Skeleton: Advanced disc degeneration from C3-4 to C6-7. Advanced left facet arthrosis at C2-3 and C3-4. Upper chest: Aortic atherosclerosis. Moderate right and small left pleural effusions. Respiratory motion artifact with  dependent atelectasis in the upper lobes. Other: There is a large right-sided neck hematoma which partially encircles the right internal jugular central venous catheter. The hematoma extends throughout essentially the entirety of the right lateral neck with inferior extension both anterior and posterior to the right clavicle. The right sternocleidomastoid muscle is inseparable from the hematoma. There is no mass effect on the airway. IMPRESSION: 1. Large right-sided neck hematoma. 2. Right larger than left pleural effusions. 3. Aortic Atherosclerosis (ICD10-I70.0). Electronically Signed   By: Sebastian Ache M.D.   On: 05/29/2020 20:35  Medications:    . Chlorhexidine Gluconate Cloth  6 each Topical Daily  . mouth rinse  15 mL Mouth Rinse BID   acetaminophen **OR** acetaminophen, antiseptic oral rinse, glycopyrrolate **OR** glycopyrrolate **OR** glycopyrrolate, haloperidol **OR** haloperidol **OR** haloperidol lactate, LORazepam **OR** LORazepam **OR** LORazepam, LORazepam, morphine injection, ondansetron **OR** ondansetron (ZOFRAN) IV, polyvinyl alcohol  Assessment/ Plan:  Mr. Curtis Walters is a 70 y.o.  male who was admitted to [facility] on 5/14/2022for acute on chronic CHF. Precious medical concerns include chronic back pain, CAD, AAA, reduced EF heart failure, tobacco abuse, and hypertension. He presented to the ED with shortness of breath.   1. Acute Kidney Injury on chronic kidney disease stage 3B with baseline creatinine 2.03 and GFR of 34 on 05/25/20.  Risk factors include atherosclerosis, CHF, and hypertension IV contrast exposure on 05/25/20 No acute need for dialysis at this time Avoid nephrotoxic agents if possible Renal function improving Discussed with family that kidneys are improving. They asked about patient prognosis and I clarified I could only speak to renal function.  Palliative care consulted and family has chosen comfort care management.  We will sign off case at this  time  Lab Results  Component Value Date   CREATININE 2.00 (H) 05/30/2020   CREATININE 2.50 (H) 05/29/2020   CREATININE 3.18 (H) 05/28/2020    Intake/Output Summary (Last 24 hours) at 05/30/2020 1451 Last data filed at 05/30/2020 0600 Gross per 24 hour  Intake 139.91 ml  Output 950 ml  Net -810.09 ml   2. Hypotension Home regimen includes Metoprolol, held Milrinone during this amission Current BP 128/90    LOS: 5   5/19/20222:51 PM

## 2020-05-30 NOTE — Progress Notes (Addendum)
[facility] Room IC09 [facility]) Hospital Liaison RN note:  Received request from Morton Stall, NP for family interest in [facility]. Chart reviewed and eligibility was approved. Spoke with daughter, Curtis Walters, over the phone to confirm interests and explain services. Tiffany verbalized understanding and had no questions. Unfortunately, [facility] is not able to offer a bed today. Hospital care team is aware. [facility] Liaison will follow for room availability.   Please call with any hospice related questions or concerns.  Thank you for the opportunity to participate in this patient's care.  Cyndra Numbers, RN [facility] Liaison [phone]

## 2020-05-31 MED ORDER — MORPHINE 100MG IN NS 100ML (1MG/ML) PREMIX INFUSION
5.0000 mg/h | INTRAVENOUS | Status: DC
  Administered 2020-05-31 – 2020-06-03 (×4): 5 mg/h via INTRAVENOUS
  Filled 2020-05-31 (×4): qty 100

## 2020-05-31 NOTE — Progress Notes (Signed)
[facility]  - [place] [facility]   PATIENT NAME: Curtis Walters    MR#:  [id number]  DATE OF BIRTH:  [birth year]  SUBJECTIVE:   resting quielty REVIEW OF SYSTEMS:   Review of Systems  Unable to perform ROS: Mental acuity   Tolerating Diet: Tolerating PT:   DRUG ALLERGIES:   Allergies  Allergen Reactions  . Penicillins Other (See Comments)    Did it involve swelling of the face/tongue/throat, SOB, or low BP? No Did it involve sudden or severe rash/hives, skin peeling, or any reaction on the inside of your mouth or nose? No Did you need to seek medical attention at a hospital or doctor's office? No When did it last happen? If all above answers are "NO", may proceed with cephalosporin use.    VITALS:  Blood pressure 121/76, pulse (!) 116, temperature (!) 97.5 F (36.4 C), temperature source Oral, resp. rate (!) 24, height 6\' 1"  (1.854 m), weight 61.9 kg, SpO2 (!) 77 %.  PHYSICAL EXAMINATION:   Physical Examlimited comfort care only  GENERAL:  70 y.o.-year-old patient lying in the bed with no acute distress. Chronically ill deconditioned LUNGS: Normal breath sounds bilaterally, no wheezing, rales, rhonchi. No use of accessory muscles of respiration.    LABORATORY PANEL:  CBC Recent Labs  Lab 05/30/20 0330  WBC 6.7  HGB 12.6*  HCT 37.9*  PLT 60*    Chemistries  Recent Labs  Lab 05/26/20 0554 05/27/20 0412 05/30/20 0330  NA 139   < > 141  K 4.0   < > 4.3  CL 107   < > 110  CO2 20*   < > 21*  GLUCOSE 203*   < > 92  BUN 42*   < > 44*  CREATININE 2.15*   < > 2.00*  CALCIUM 8.9   < > 8.8*  MG  --    < > 2.1  AST 84*  --   --   ALT 60*  --   --   ALKPHOS 74  --   --   BILITOT 3.7*  --   --    < > = values in this interval not displayed.   Cardiac Enzymes No results for input(s): TROPONINI in the last 168 hours. RADIOLOGY:  CT SOFT TISSUE NECK WO CONTRAST  Result Date: 05/29/2020 CLINICAL DATA:  Right neck hematoma. EXAM: CT NECK  WITHOUT CONTRAST TECHNIQUE: Multidetector CT imaging of the neck was performed following the standard protocol without intravenous contrast. COMPARISON:  None. FINDINGS: The study is motion degraded despite repeat imaging. Pharynx and larynx: No gross mass or swelling is identified within limitations of motion artifact and noncontrast technique. There is no retropharyngeal fluid. The airway is patent. Salivary glands: Suboptimal assessment of the right parotid and right submandibular glands due to streak artifact. Below described right neck hematoma extends into the parotid region. Grossly unremarkable left parotid and left submandibular glands. Thyroid: Unremarkable. Lymph nodes: Limited assessment for right-sided cervical lymphadenopathy due to the presence of the large hematoma, motion and streak artifact, and noncontrast technique. No enlarged lymph nodes identified in the left neck. Vascular: Moderate calcific atherosclerosis at the carotid bifurcations. Limited intracranial: Calcified atherosclerosis at the skull base. Visualized orbits: Unremarkable. Mastoids and visualized paranasal sinuses: Small mucous retention cyst in the right maxillary sinus. Clear mastoid air cells. Skeleton: Advanced disc degeneration from C3-4 to C6-7. Advanced left facet arthrosis at C2-3 and C3-4. Upper chest: Aortic atherosclerosis. Moderate right and small left pleural  effusions. Respiratory motion artifact with dependent atelectasis in the upper lobes. Other: There is a large right-sided neck hematoma which partially encircles the right internal jugular central venous catheter. The hematoma extends throughout essentially the entirety of the right lateral neck with inferior extension both anterior and posterior to the right clavicle. The right sternocleidomastoid muscle is inseparable from the hematoma. There is no mass effect on the airway. IMPRESSION: 1. Large right-sided neck hematoma. 2. Right larger than left pleural  effusions. 3. Aortic Atherosclerosis (ICD10-I70.0). Electronically Signed   By: Sebastian Ache M.D.   On: 05/29/2020 20:35   ASSESSMENT AND PLAN:  70/M with history of nonischemic cardiomyopathy, EF of 20%, mild pulmonary hypertension, moderate MR/TR, mild to moderate AAS, alcohol abuse, alcoholic liver disease, tobacco abuse, chronic back pain presented to the ED with dyspnea.,  He is a very poor historian with significant cognitive and memory deficits.  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 very small open area with a small amount of redness note surrounding the opena area (Active)  05/26/20 2100  Location:   Location Orientation: Right;Left (inner buttocks)  Staging: Stage 2 -  Partial thickness loss of dermis presenting as a shallow open injury with a red, pink wound bed without slough.  Wound Description (Comments): very small open area with a small amount of redness note surrounding the opena area  Present on Admission:         palliative care discussed with patient's family. Given overall multiple comorbidities and poor prognosis family agreeable with comfort care.  Status is: Inpatient  Patient is transition to comfort care. Hospice referral made  5/20--no hospice beds today                        TOTAL TIME TAKING CARE OF THIS PATIENT: 20 minutes.  >50% time spent on counselling and coordination of care  Note: This dictation was prepared with Dragon dictation along with smaller phrase technology. Any transcriptional errors that result from this process are unintentional.  Enedina Finner M.D    [facility]   CC: Primary care physician; Patient, No Pcp Per (Inactive)Patient ID: Curtis Walters, male   DOB: 1950/03/16, 70 y.o.   MRN: [id number]

## 2020-05-31 NOTE — Care Management Important Message (Signed)
Important Message  Patient Details  Name: Curtis Walters MRN: [id number] Date of Birth: 1950-01-17   Medicare Important Message Given:  Other (see comment)  On comfort care measures with plans to transfer to Hospice Home once bed available.  Medicare IM withheld at this time.    [name] 05/31/2020, 8:17 AM

## 2020-05-31 NOTE — TOC Progression Note (Signed)
Transition of Care [facility]) - Progression Note    Patient Details  Name: Curtis Walters MRN: [id number] Date of Birth: [birth year]  [facility]) CM/SW Contact  Allayne Butcher, RN Phone Number: 05/31/2020, 1:58 PM  Clinical Narrative:    No hospice home bed available today.         Expected Discharge Plan and Services                                                 Social Determinants of Health (SDOH) Interventions    Readmission Risk Interventions No flowsheet data found.

## 2020-05-31 NOTE — Progress Notes (Signed)
[facility] Room 112 [facility]) Hospital Liaison RN note:  Unfortunately, [facility]osp[facility] is not able to offer a room today. Hospital care team is aware. Visited patient at bedside. Spoke with daughter, Elmarie Shiley over the phone to provide update. [facility] Liaison will continue to monitor for room availability.  Please call with any hospice related questions or concerns.  Thank you for the opportunity to participate in this patient's care.  Jerene Canny [facility] Liaison [phone]

## 2020-05-31 NOTE — Progress Notes (Signed)
Pts daughter Elmarie Shiley is requesting morphine gtt since pt appears restless and uncomfortable. Dter understands the outcome. I will order morphine gtt. D/w pharmacy and pts RN

## 2020-05-31 NOTE — Progress Notes (Signed)
Received comfort care patient from ICU. Placed patient on 2 liters of oxygen for comfort when he arrived to unit. Clean sacral foam in place, marked hemotoma to patients right neck. Patient restless and does not answer any questions, just picks his head up and down. Does not seem to try to crawl out of bed, but bed alarm placed on for patient safety. Mouth swabbed, call light placed at the bedside within the patients reach. Will continue to monitor for change.

## 2020-06-01 NOTE — Progress Notes (Signed)
[facility]  - [place] at [facility]   PATIENT NAME: Curtis Walters    MR#:  [id number]  DATE OF BIRTH:  [birth year]  SUBJECTIVE:   resting quietly.Per RN family stepped out for BF. Now on morphine gtt REVIEW OF SYSTEMS:   Review of Systems  Unable to perform ROS: Mental acuity  comfort care  DRUG ALLERGIES:   Allergies  Allergen Reactions  . Penicillins Other (See Comments)    Did it involve swelling of the face/tongue/throat, SOB, or low BP? No Did it involve sudden or severe rash/hives, skin peeling, or any reaction on the inside of your mouth or nose? No Did you need to seek medical attention at a hospital or doctor's office? No When did it last happen? If all above answers are "NO", may proceed with cephalosporin use.    VITALS:  Blood pressure 102/67, pulse 88, temperature 98 F (36.7 C), temperature source Oral, resp. rate 16, height 6\' 1"  (1.854 m), weight 61.9 kg, SpO2 (!) 80 %.  PHYSICAL EXAMINATION:   Physical Examlimited comfort care only  GENERAL:  70 y.o.-year-old patient lying in the bed with no acute distress. Chronically ill deconditioned LUNGS: Normal breath sounds bilaterally, no wheezing, rales, rhonchi. No use of accessory muscles of respiration.    LABORATORY PANEL:  CBC Recent Labs  Lab 05/30/20 0330  WBC 6.7  HGB 12.6*  HCT 37.9*  PLT 60*    Chemistries  Recent Labs  Lab 05/26/20 0554 05/27/20 0412 05/30/20 0330  NA 139   < > 141  K 4.0   < > 4.3  CL 107   < > 110  CO2 20*   < > 21*  GLUCOSE 203*   < > 92  BUN 42*   < > 44*  CREATININE 2.15*   < > 2.00*  CALCIUM 8.9   < > 8.8*  MG  --    < > 2.1  AST 84*  --   --   ALT 60*  --   --   ALKPHOS 74  --   --   BILITOT 3.7*  --   --    < > = values in this interval not displayed.   Cardiac Enzymes No results for input(s): TROPONINI in the last 168 hours. RADIOLOGY:  No results found. ASSESSMENT AND PLAN:  70/M with history of nonischemic cardiomyopathy, EF  of 20%, mild pulmonary hypertension, moderate MR/TR, mild to moderate AAS, alcohol abuse, alcoholic liver disease, tobacco abuse, chronic back pain presented to the ED with dyspnea.,  He is a very poor historian with significant cognitive and memory deficits.  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 very small open area with a small amount of redness note surrounding the opena area (Active)  05/26/20 2100  Location:   Location Orientation: Right;Left (inner buttocks)  Staging: Stage 2 -  Partial thickness  loss of dermis presenting as a shallow open injury with a red, pink wound bed without slough.  Wound Description (Comments): very small open area with a small amount of redness note surrounding the opena area  Present on Admission:    palliative care discussed with patient's family. Given overall multiple comorbidities and poor prognosis family agreeable with comfort care.  Status is: Inpatient  Patient is transition to comfort care. Hospice referral made  5/20--no hospice beds today               5/21--started last pm on morphine gtt per family request.        TOTAL TIME TAKING CARE OF THIS PATIENT: 20 minutes.  >50% time spent on counselling and coordination of care  Note: This dictation was  prepared with Dragon dictation along with smaller phrase technology. Any transcriptional errors that result from this process are unintentional.  Enedina Finner M.D    [facility]   CC: Primary care physician; Patient, No Pcp Per (Inactive)Patient ID: Jerene Canny, male   DOB: July 14, 1950, 70 y.o.   MRN: [id number]

## 2020-06-01 NOTE — Progress Notes (Signed)
  Chaplain On-Call met patient's son J.T. and friend Curtis Walters at bedside at 6:00 pm.  We discussed the patient's grave condition, and they each stated their relief that the patient is not restless and agitated tonight as he had been last night.  Chaplain provided spiritual and emotional support and prayer, and assured them of future availability of Chaplains as needed.  Chaplain Pollyann Samples M.Div., [facility]

## 2020-06-01 NOTE — Progress Notes (Signed)
  Chaplain On-Call responded to Order Requisition for support of patient's family due to end of life/comfort care.  Chaplain entered patient's room and found him to be non-responsive. No family is present.  Chaplain spoke with RN Jackquline Bosch, who stated that he will page this Chaplain when the family returns to visit later today.  Chaplain Evelena Peat M.Div., [facility]

## 2020-06-01 NOTE — Plan of Care (Signed)
Pt appears much more comfortable on morphine drip. Have been rounding on pt q1-2 hours.  He has had periods of apnea.  When I just rounded, his breathing has changed.  Called Ms. Walker, pt's significant other, and just updated.  She will call his children to determine if they want to come in.

## 2020-06-02 NOTE — Progress Notes (Signed)
[facility]  - [place] at [facility]   PATIENT NAME: Curtis Walters    MR#:  [id number]  DATE OF BIRTH:  1950/08/06  SUBJECTIVE:   resting quietly. on morphine gtt for comfort care. No family at bedside REVIEW OF SYSTEMS:   Review of Systems  Unable to perform ROS: Mental acuity  comfort care  DRUG ALLERGIES:   Allergies  Allergen Reactions  . Penicillins Other (See Comments)    Did it involve swelling of the face/tongue/throat, SOB, or low BP? No Did it involve sudden or severe rash/hives, skin peeling, or any reaction on the inside of your mouth or nose? No Did you need to seek medical attention at a hospital or doctor's office? No When did it last happen? If all above answers are "NO", may proceed with cephalosporin use.    VITALS:  Blood pressure 92/60, pulse 100, temperature 97.6 F (36.4 C), temperature source Oral, resp. rate 16, height 6\' 1"  (1.854 m), weight 61.9 kg, SpO2 98 %.  PHYSICAL EXAMINATION:   Physical Examlimited comfort care only  GENERAL:  70 y.o.-year-old patient lying in the bed with no acute distress. Chronically ill deconditioned LUNGS: Normal breath sounds bilaterally, no wheezing, rales, rhonchi. No use of accessory muscles of respiration.    LABORATORY PANEL:  CBC Recent Labs  Lab 05/30/20 0330  WBC 6.7  HGB 12.6*  HCT 37.9*  PLT 60*    Chemistries  Recent Labs  Lab 05/30/20 0330  NA 141  K 4.3  CL 110  CO2 21*  GLUCOSE 92  BUN 44*  CREATININE 2.00*  CALCIUM 8.8*  MG 2.1   Cardiac Enzymes No results for input(s): TROPONINI in the last 168 hours. RADIOLOGY:  No results found. ASSESSMENT AND PLAN:  70/M with history of nonischemic cardiomyopathy, EF of 20%, mild pulmonary hypertension, moderate MR/TR, mild to moderate AAS, alcohol abuse, alcoholic liver disease, tobacco abuse, chronic back pain presented to the ED with dyspnea.,  He is a very poor historian with significant cognitive and memory  deficits.  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 very small open area with a small amount of redness note surrounding the opena area (Active)  05/26/20 2100  Location:   Location Orientation: Right;Left (inner buttocks)  Staging: Stage 2 -  Partial thickness loss of dermis presenting as a shallow open injury with a red, pink wound bed without slough.  Wound Description (Comments): very small open area with a small amount of redness note surrounding the opena area  Present on Admission:    palliative care discussed with patient's family. Given overall multiple comorbidities and poor prognosis family agreeable with comfort care.  Status is: Inpatient  Patient is transition to comfort care. Hospice referral made  5/20--no hospice beds today               5/21--started last pm on morphine  gtt per family request.        TOTAL TIME TAKING CARE OF THIS PATIENT: 15 minutes.  >50% time spent on counselling and coordination of care  Note: This dictation was prepared with Dragon dictation along with smaller phrase technology. Any transcriptional errors that result from this process are unintentional.  Enedina Finner M.D    [facility]   CC: Primary care physician; Patient, No Pcp Per  (Inactive)Patient ID: Jerene Canny, male   DOB: 12-31-1950, 70 y.o.   MRN: [id number]

## 2020-06-03 MED ORDER — HALOPERIDOL LACTATE 5 MG/ML IJ SOLN: 0.5000 mg | INTRAMUSCULAR | 0 refills | Status: AC | PRN

## 2020-06-03 MED ORDER — LORAZEPAM 2 MG/ML IJ SOLN: 1.0000 mg | INTRAMUSCULAR | 0 refills | Status: AC | PRN

## 2020-06-03 MED ORDER — GLYCOPYRROLATE 0.2 MG/ML IJ SOLN: 0.2000 mg | INTRAMUSCULAR | 0 refills | Status: AC | PRN

## 2020-06-03 MED ORDER — MORPHINE 100MG IN NS 100ML (1MG/ML) PREMIX INFUSION: 5.0000 mg/h | INTRAVENOUS | 0 refills | Status: AC

## 2020-06-03 NOTE — TOC Progression Note (Signed)
Transition of Care [facility]) - Progression Note    Patient Details  Name: Curtis Walters MRN: [id number] Date of Birth: July 26, 1950  Transition of [facility]) CM/SW Contact  Hetty Ely, RN Phone Number: 06/03/2020, 10:10 AM  Clinical Narrative:  For discharge to Hospice Home via [facility].      Barriers to Discharge: Barriers Resolved  Expected Discharge Plan and Services                                                 Social Determinants of Health (SDOH) Interventions    Readmission Risk Interventions No flowsheet data found.

## 2020-06-03 NOTE — Care Management Important Message (Signed)
Important Message  Patient Details  Name: Curtis Walters MRN: [id number] Date of Birth: 05-29-1950   Medicare Important Message Given:  Yes  On comfort care measures with plans to transfer to Hospice Home once bed available.  Medicare IM withheld at this time.    [name] 06/03/2020, 8:29 AM

## 2020-06-03 NOTE — Discharge Summary (Signed)
[facility] - [place] at [facility]   PATIENT NAME: Curtis Walters    MR#:  [id number]  DATE OF BIRTH:  [birth year]  DATE OF ADMISSION:  05/25/2020 ADMITTING PHYSICIAN: Gertha Calkin, MD  DATE OF DISCHARGE: 06/03/2020  PRIMARY CARE PHYSICIAN: Patient, No Pcp Per (Inactive)    ADMISSION DIAGNOSIS:  Shortness of breath [R06.02] Syncope [R55] SOB (shortness of breath) [R06.02] Pleural effusion [J90] Hepatic steatosis [K76.0] Acute kidney injury (HCC) [N17.9] Elevated lactic acid level [R79.89] Ascites [R18.8] Renal failure, unspecified chronicity [N19]  DISCHARGE DIAGNOSIS:  acute on chronic systolic congestive heart failure with cardio renal syndrome right pleural effusion severe cardiomyopathy EF 20 to 25% alcoholic liver disease with hepatic congestion severe thrombocytopenia failure to thrive /moderate protein calorie malnutrition  SECONDARY DIAGNOSIS:   Past Medical History:  Diagnosis Date  . Alcohol use   . Chronic back pain   . Coronary artery disease, non-occlusive   . Dilated aortic root (HCC)   . HFrEF (heart failure with reduced ejection fraction) (HCC)   . Hypertension   . NICM (nonischemic cardiomyopathy) (HCC)   . Tobacco abuse   . Transaminitis     HOSPITAL COURSE:   70/Mwith history of nonischemic cardiomyopathy, EF of 20%, mild pulmonary hypertension, moderate MR/TR, mild to moderate AAS, alcohol abuse, alcoholic liver disease, tobacco abuse, chronic back pain presented to the ED with dyspnea., He is a very poor historian with significant cognitive and memory deficits.  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 very small open area with a small amount of redness note surrounding the opena area (Active)  05/26/20 2100  Location:   Location Orientation: Right;Left (inner buttocks)  Staging: Stage 2 -  Partial thickness loss of dermis presenting as a shallow open injury with a red, pink wound bed without slough.  Wound Description (Comments): very small open area with a small amount of redness note surrounding the opena area  Present on Admission:    palliative care discussed with patient's family. Given overall multiple comorbidities and poor prognosis family agreeable with comfort care.   Patient is transition to comfort care. Hospice referral made  5/20--no hospice beds today   5/21--started last pm on morphine gtt per family request.  Patient will transfer to hospice facility later today. Will continue morphine drip at the facility. Patient will discharged with central line and Foley catheter which is part of comfort care.    CONSULTS OBTAINED:    DRUG ALLERGIES:   Allergies  Allergen Reactions  . Penicillins Other (See Comments)    Did it involve swelling of the face/tongue/throat, SOB, or low BP? No Did it involve sudden or severe rash/hives, skin peeling, or any reaction on the inside of your mouth or nose? No Did you need to seek medical attention at a hospital or doctor's office? No When did it last happen? If all above  answers are "NO", may proceed with cephalosporin use.    DISCHARGE MEDICATIONS:   Allergies as of 06/03/2020      Reactions   Penicillins Other (See Comments)   Did it  involve swelling of the face/tongue/throat, SOB, or low BP? No Did it involve sudden or severe rash/hives, skin peeling, or any reaction on the inside of your mouth or nose? No Did you need to seek medical attention at a hospital or doctor's office? No When did it last happen? If all above answers are "NO", may proceed with cephalosporin use.      Medication List    STOP taking these medications   metoprolol succinate 25 MG 24 hr tablet Commonly known as: Toprol XL   torsemide 20 MG tablet Commonly known as: DEMADEX     TAKE these medications   glycopyrrolate 0.2 MG/ML injection Commonly known as: ROBINUL Inject 1 mL (0.2 mg total) into the vein every 4 (four) hours as needed (excessive secretions).   haloperidol lactate 5 MG/ML injection Commonly known as: HALDOL Inject 0.1 mLs (0.5 mg total) into the vein every 4 (four) hours as needed (or delirium).   LORazepam 2 MG/ML injection Commonly known as: ATIVAN Inject 0.5-1 mLs (1-2 mg total) into the vein every hour as needed (seizures or agitation).   morphine 1 mg/mL Soln infusion Inject 5 mg/hr into the vein continuous.            Discharge Care Instructions  (From admission, onward)         Start     Ordered   06/03/20 0000  Discharge wound care:       Comments: Silicone foam on wound   06/03/20 1130          If you experience worsening of your admission symptoms, develop shortness of breath, life threatening emergency, suicidal or homicidal thoughts you must seek medical attention immediately by calling 911 or calling your MD immediately  if symptoms less severe.  You Must read complete instructions/literature along with all the possible adverse reactions/side effects for all the Medicines you take and that have been prescribed to you. Take any new Medicines after you have completely understood and accept all the possible adverse reactions/side effects.   Please note  You were cared for by a  hospitalist during your hospital stay. If you have any questions about your discharge medications or the care you received while you were in the hospital after you are discharged, you can call the unit and asked to speak with the hospitalist on call if the hospitalist that took care of you is not available. Once you are discharged, your primary care physician will handle any further medical issues. Please note that NO REFILLS for any discharge medications will be authorized once you are discharged, as it is imperative that you return to your primary care physician (or establish a relationship with a primary care physician if you do not have one) for your aftercare needs so that they can reassess your need for medications and monitor your lab values.   DATA REVIEW:   CBC  Recent Labs  Lab 05/30/20 0330  WBC 6.7  HGB 12.6*  HCT 37.9*  PLT 60*    Chemistries  Recent Labs  Lab 05/30/20 0330  NA 141  K 4.3  CL 110  CO2 21*  GLUCOSE 92  BUN 44*  CREATININE 2.00*  CALCIUM 8.8*  MG 2.1    Microbiology Results   Recent Results (from the past  240 hour(s))  Blood culture (single)     Status: Abnormal   Collection Time: 05/25/20  3:16 PM   Specimen: BLOOD  Result Value Ref Range Status   Specimen Description   Final    BLOOD Blood Culture adequate volume Performed at [facility], [address] [place], [place] [address]    Special Requests   Final    BOTTLES DRAWN AEROBIC AND ANAEROBIC BLOOD RIGHT FOREARM Performed at [facility], [address], [place], [place] [address]    Culture  Setup Time   Final    GRAM POSITIVE COCCI AEROBIC BOTTLE ONLY CRITICAL RESULT CALLED TO, READ BACK BY AND VERIFIED WITH: JASON ROBBINS @0342  05/27/20    Culture (A)  Final    FACKLAMIA HOMINIS Standardized susceptibility testing for this organism is not available. Performed at [facility], [address] [place], [place]    Report Status 05/29/2020  FINAL  Final  Blood Culture ID Panel (Reflexed)     Status: None   Collection Time: 05/25/20  3:16 PM  Result Value Ref Range Status   Enterococcus faecalis NOT DETECTED NOT DETECTED Final   Enterococcus Faecium NOT DETECTED NOT DETECTED Final   Listeria monocytogenes NOT DETECTED NOT DETECTED Final   Staphylococcus species NOT DETECTED NOT DETECTED Final   Staphylococcus aureus (BCID) NOT DETECTED NOT DETECTED Final   Staphylococcus epidermidis NOT DETECTED NOT DETECTED Final   Staphylococcus lugdunensis NOT DETECTED NOT DETECTED Final   Streptococcus species NOT DETECTED NOT DETECTED Final   Streptococcus agalactiae NOT DETECTED NOT DETECTED Final   Streptococcus pneumoniae NOT DETECTED NOT DETECTED Final   Streptococcus pyogenes NOT DETECTED NOT DETECTED Final   A.calcoaceticus-baumannii NOT DETECTED NOT DETECTED Final   Bacteroides fragilis NOT DETECTED NOT DETECTED Final   Enterobacterales NOT DETECTED NOT DETECTED Final   Enterobacter cloacae complex NOT DETECTED NOT DETECTED Final   Escherichia coli NOT DETECTED NOT DETECTED Final   Klebsiella aerogenes NOT DETECTED NOT DETECTED Final   Klebsiella oxytoca NOT DETECTED NOT DETECTED Final   Klebsiella pneumoniae NOT DETECTED NOT DETECTED Final   Proteus species NOT DETECTED NOT DETECTED Final   Salmonella species NOT DETECTED NOT DETECTED Final   Serratia marcescens NOT DETECTED NOT DETECTED Final   Haemophilus influenzae NOT DETECTED NOT DETECTED Final   Neisseria meningitidis NOT DETECTED NOT DETECTED Final   Pseudomonas aeruginosa NOT DETECTED NOT DETECTED Final   Stenotrophomonas maltophilia NOT DETECTED NOT DETECTED Final   Candida albicans NOT DETECTED NOT DETECTED Final   Candida auris NOT DETECTED NOT DETECTED Final   Candida glabrata NOT DETECTED NOT DETECTED Final   Candida krusei NOT DETECTED NOT DETECTED Final   Candida parapsilosis NOT DETECTED NOT DETECTED Final   Candida tropicalis NOT DETECTED NOT DETECTED  Final   Cryptococcus neoformans/gattii NOT DETECTED NOT DETECTED Final    Comment: Performed at [facility], [address] [place], [place]  SARS CORONAVIRUS 2 (TAT 6-24 HRS) Nasopharyngeal Nasopharyngeal Swab     Status: None   Collection Time: 05/25/20  3:21 PM   Specimen: Nasopharyngeal Swab  Result Value Ref Range Status   SARS Coronavirus 2 NEGATIVE NEGATIVE Final    Comment: (NOTE) SARS-CoV-2 target nucleic acids are NOT DETECTED.  The SARS-CoV-2 RNA is generally detectable in upper and lower respiratory specimens during the acute phase of infection. Negative results do not preclude SARS-CoV-2 infection, do not rule out co-infections with other pathogens, and should not be used as  the sole basis for treatment or other patient management decisions. Negative results must be combined with clinical observations, patient history, and epidemiological information. The expected result is Negative.  Fact Sheet for Patients: HairSlick.no  Fact Sheet for Healthcare Providers: quierodirigir.com  This test is not yet approved or cleared by the [place] FDA and  has been authorized for detection and/or diagnosis of SARS-CoV-2 by FDA under an Emergency Use Authorization (EUA). This EUA will remain  in effect (meaning this test can be used) for the duration of the COVID-19 declaration under Se ction 564(b)(1) of the Act, 21 U.S.C. section 360bbb-3(b)(1), unless the authorization is terminated or revoked sooner.  Performed at [facility], [address] [place], [place] [address]   MRSA PCR Screening     Status: None   Collection Time: 05/26/20 12:10 AM   Specimen: Nasal Mucosa; Nasopharyngeal  Result Value Ref Range Status   MRSA by PCR NEGATIVE NEGATIVE Final    Comment:        The GeneXpert MRSA Assay (FDA approved for NASAL specimens only), is one component of a comprehensive MRSA  colonization surveillance program. It is not intended to diagnose MRSA infection nor to guide or monitor treatment for MRSA infections. Performed at [facility], [address], [place], [place] [address]     RADIOLOGY:  No results found.   CODE STATUS:     Code Status Orders  (From admission, onward)         Start     Ordered   05/30/20 1142  Do not attempt resuscitation (DNR)  Continuous       Question Answer Comment  In the event of cardiac or respiratory ARREST Do not call a "code blue"   In the event of cardiac or respiratory ARREST Do not perform Intubation, CPR, defibrillation or ACLS   In the event of cardiac or respiratory ARREST Use medication by any route, position, wound care, and other measures to relive pain and suffering. May use oxygen, suction and manual treatment of airway obstruction as needed for comfort.   Comments MOST form in chart.      05/30/20 1143        Code Status History    Date Active Date Inactive Code Status Order ID Comments User Context   05/25/2020 1747 05/30/2020 1057 Full Code [id number]  Gertha Calkin, MD ED   08/25/2019 1613 08/29/2019 1639 Full Code [id number]  Lorretta Harp, MD ED   Advance Care Planning Activity       TOTAL TIME TAKING CARE OF THIS PATIENT: *35* minutes.    Enedina Finner M.D  [facility]    CC: Primary care physician; Patient, No Pcp Per (Inactive)

## 2020-06-03 NOTE — Progress Notes (Signed)
[facility] Room 112 [facility]) Hospital Liaison RN note:  Hospice Home does have a room to offer today. Spoke with son, Yoho to provide update. He will sign consents today. Plan is for discharge to the hospice home this evening, with 5 pm transport set by [facility] as requested.  Nurse  please call report to hospice home at [phone].  Please call with any hospice related questions or concerns.  Thank you for the opportunity to participate in this patient's care.  Cyndra Numbers, RN [facility] RN [phone]

## 2020-06-03 NOTE — Progress Notes (Signed)
Patient is being discharged to Hospice today. Hospice nurse Runell Gess) called to get report. Report given. Patient is going to transport to the [facility] via EMS later this evening.

## 2020-06-06 LAB — BLOOD GAS, ARTERIAL
Acid-base deficit: 1.5 mmol/L (ref 0.0–2.0)
Bicarbonate: 21.3 mmol/L (ref 20.0–28.0)
FIO2: 0.21
O2 Saturation: 97 %
Patient temperature: 37
pCO2 arterial: 30 mmHg — ABNORMAL LOW (ref 32.0–48.0)
pH, Arterial: 7.46 — ABNORMAL HIGH (ref 7.350–7.450)
pO2, Arterial: 85 mmHg (ref 83.0–108.0)

## 2021-09-05 IMAGING — US US ABDOMEN COMPLETE
1 series · 14 of 25 positions shown · non-contrast
Comparison: Same day CT chest.

CLINICAL DATA: Ascites, renal failure and hepatic steatosis

EXAM:
ABDOMEN ULTRASOUND COMPLETE

[Series 1: us abdomen complete · 14 of 57 slices shown]
[im 1/57]
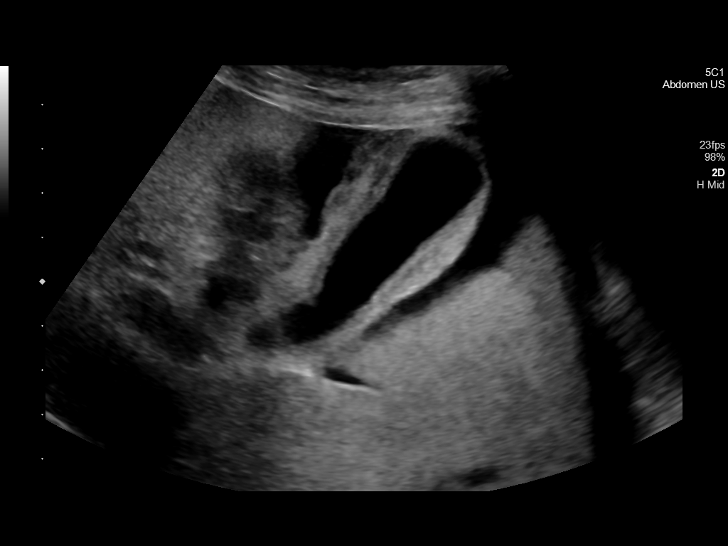
[im 5/57]
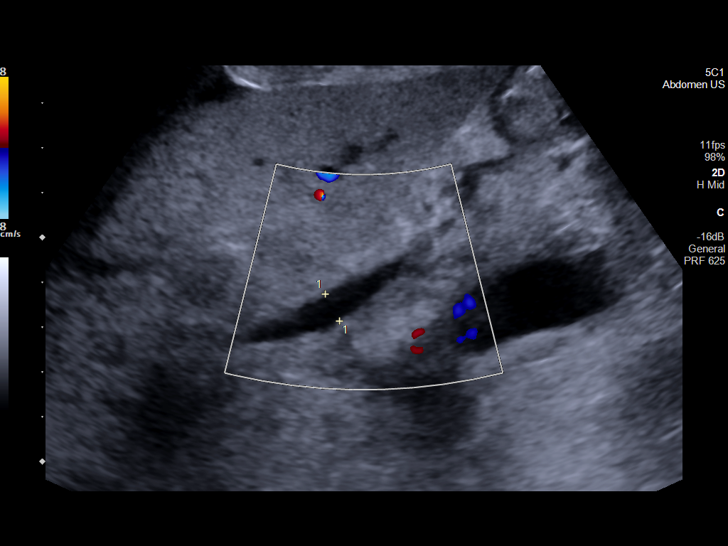
[im 10/57]
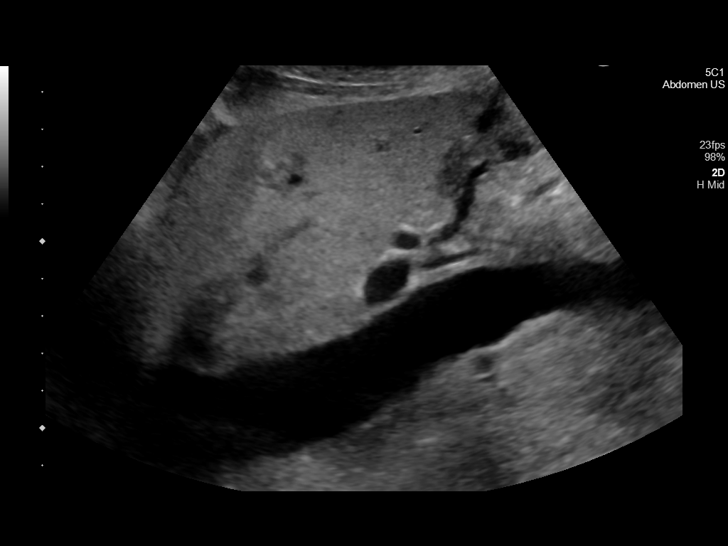
[im 15/57]
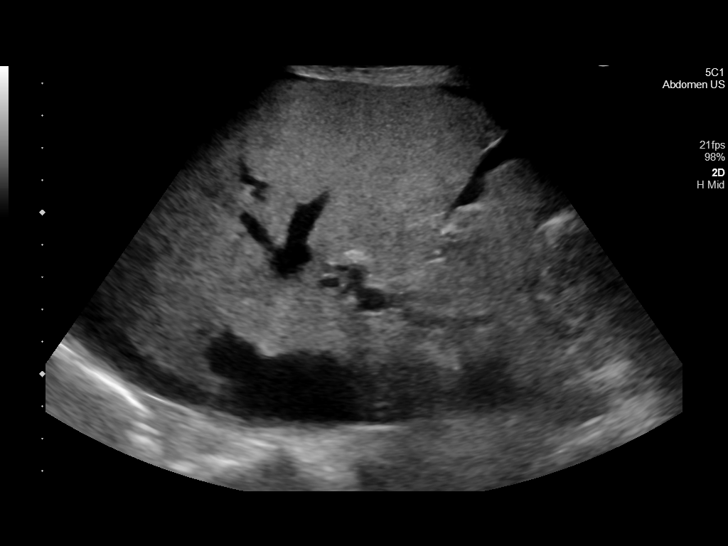
[im 19/57]
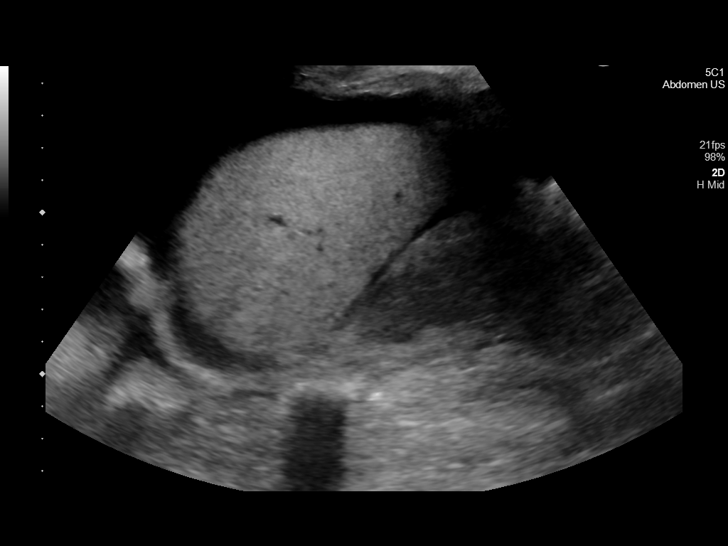
[im 22/57]
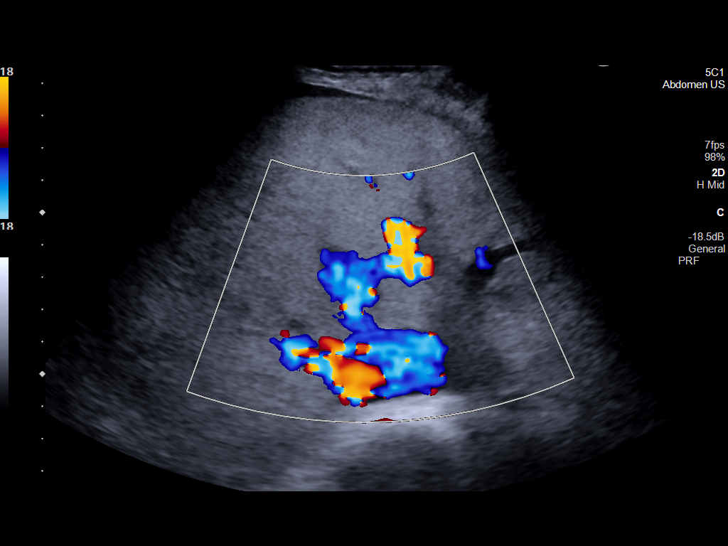
[im 26/57]
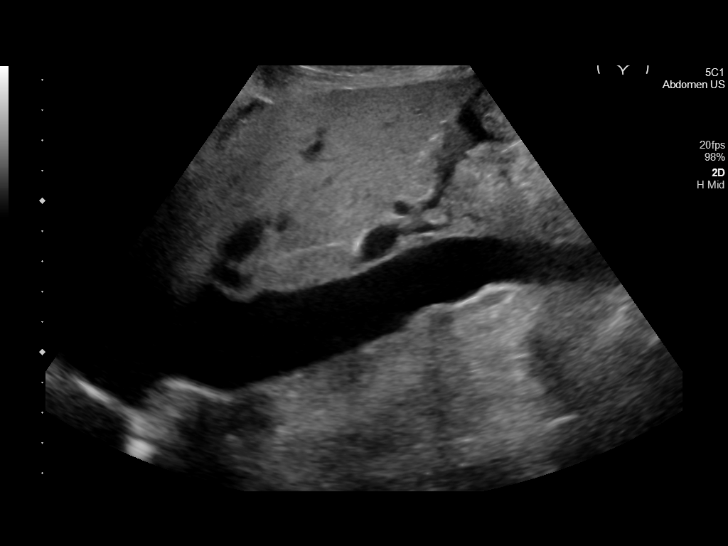
[im 31/57]
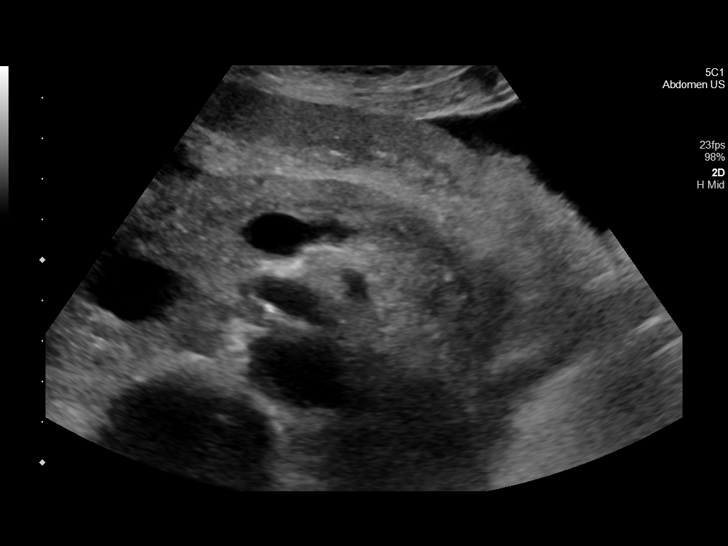
[im 36/57]
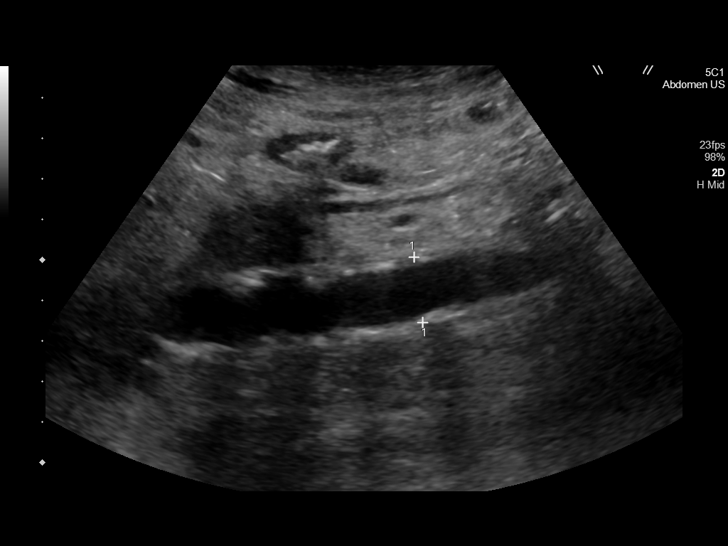
[im 38/57]
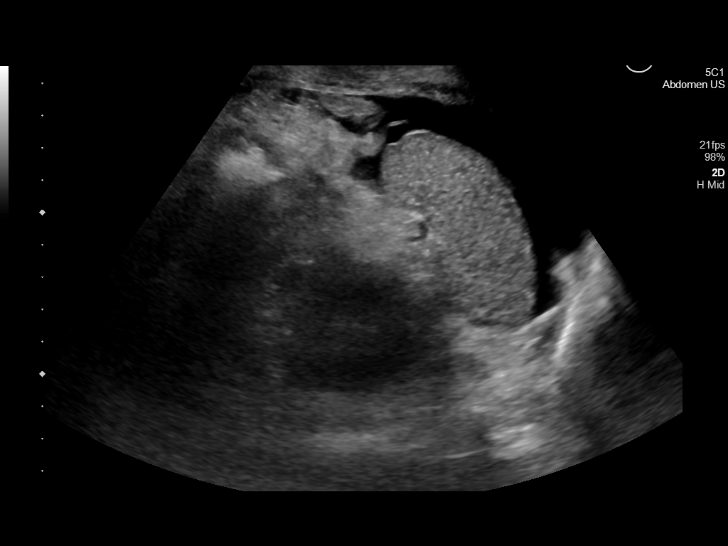
[im 43/57]
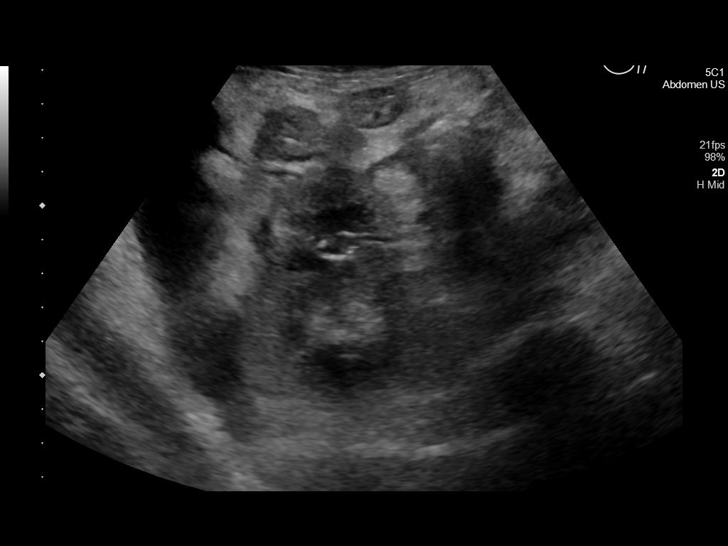
[im 47/57]
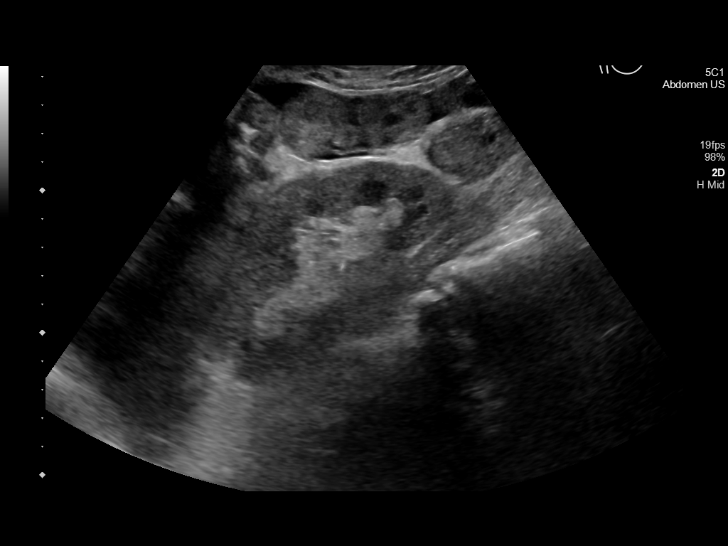
[im 52/57]
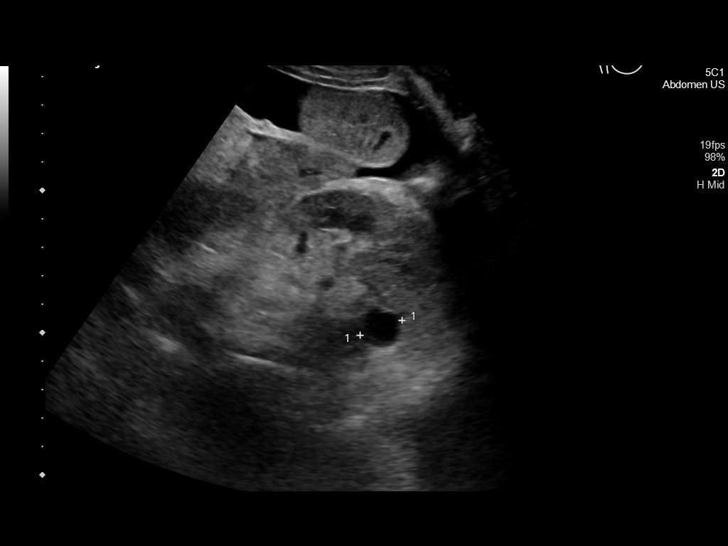
[im 57/57]
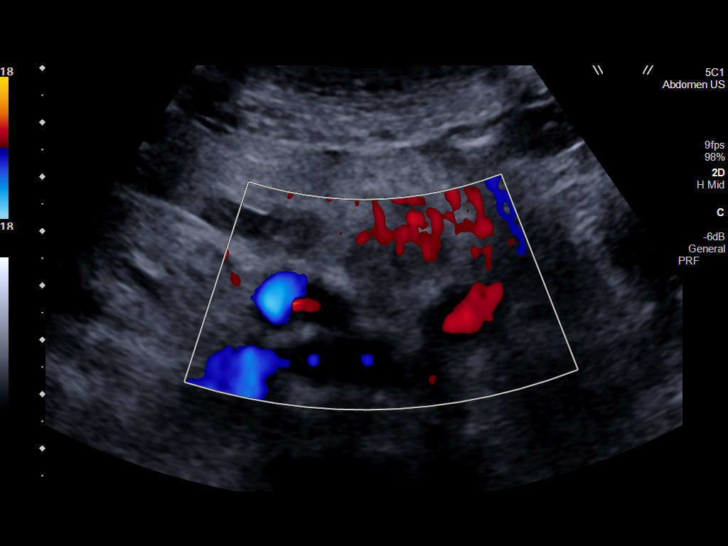

[14 of 25 positions shown; findings below may reference images not displayed]

FINDINGS: Gallbladder: No gallstones or wall thickening visualized. No
sonographic Murphy sign noted by sonographer.

Common bile duct: Diameter: 7 mm which is within normal limits for
patient's age.

Liver: No focal lesion identified. Coarsened echotexture with
increased parenchymal echogenicity. Portal vein is patent on color
Doppler imaging with normal direction of blood flow towards the
liver.

IVC: No abnormality visualized.

Pancreas: Visualized portion unremarkable.

Spleen: Size and appearance within normal limits.

Right Kidney: Length: 9.9 cm. Echogenicity within normal limits. No
mass or hydronephrosis visualized.

Left Kidney: Length: 10.1 cm. Echogenicity within normal limits.
cm renal cyst. No solid mass or hydronephrosis visualized.

Abdominal aorta: No aneurysm visualized.

Other findings: Small volume ascites.
IMPRESSION: Coarsened echotexture with increased parenchymal echogenicity
suggesting fatty infiltration or other diffuse hepatocellular
disease.

Small volume ascites.

## 2021-09-05 IMAGING — CR DG CHEST 2V
2 series · 2 of 2 positions shown · non-contrast
Comparison: 08/25/2019

CLINICAL DATA: Shortness of breath.  Diarrhea and weakness.

EXAM:
CHEST - 2 VIEW

[chest pa]
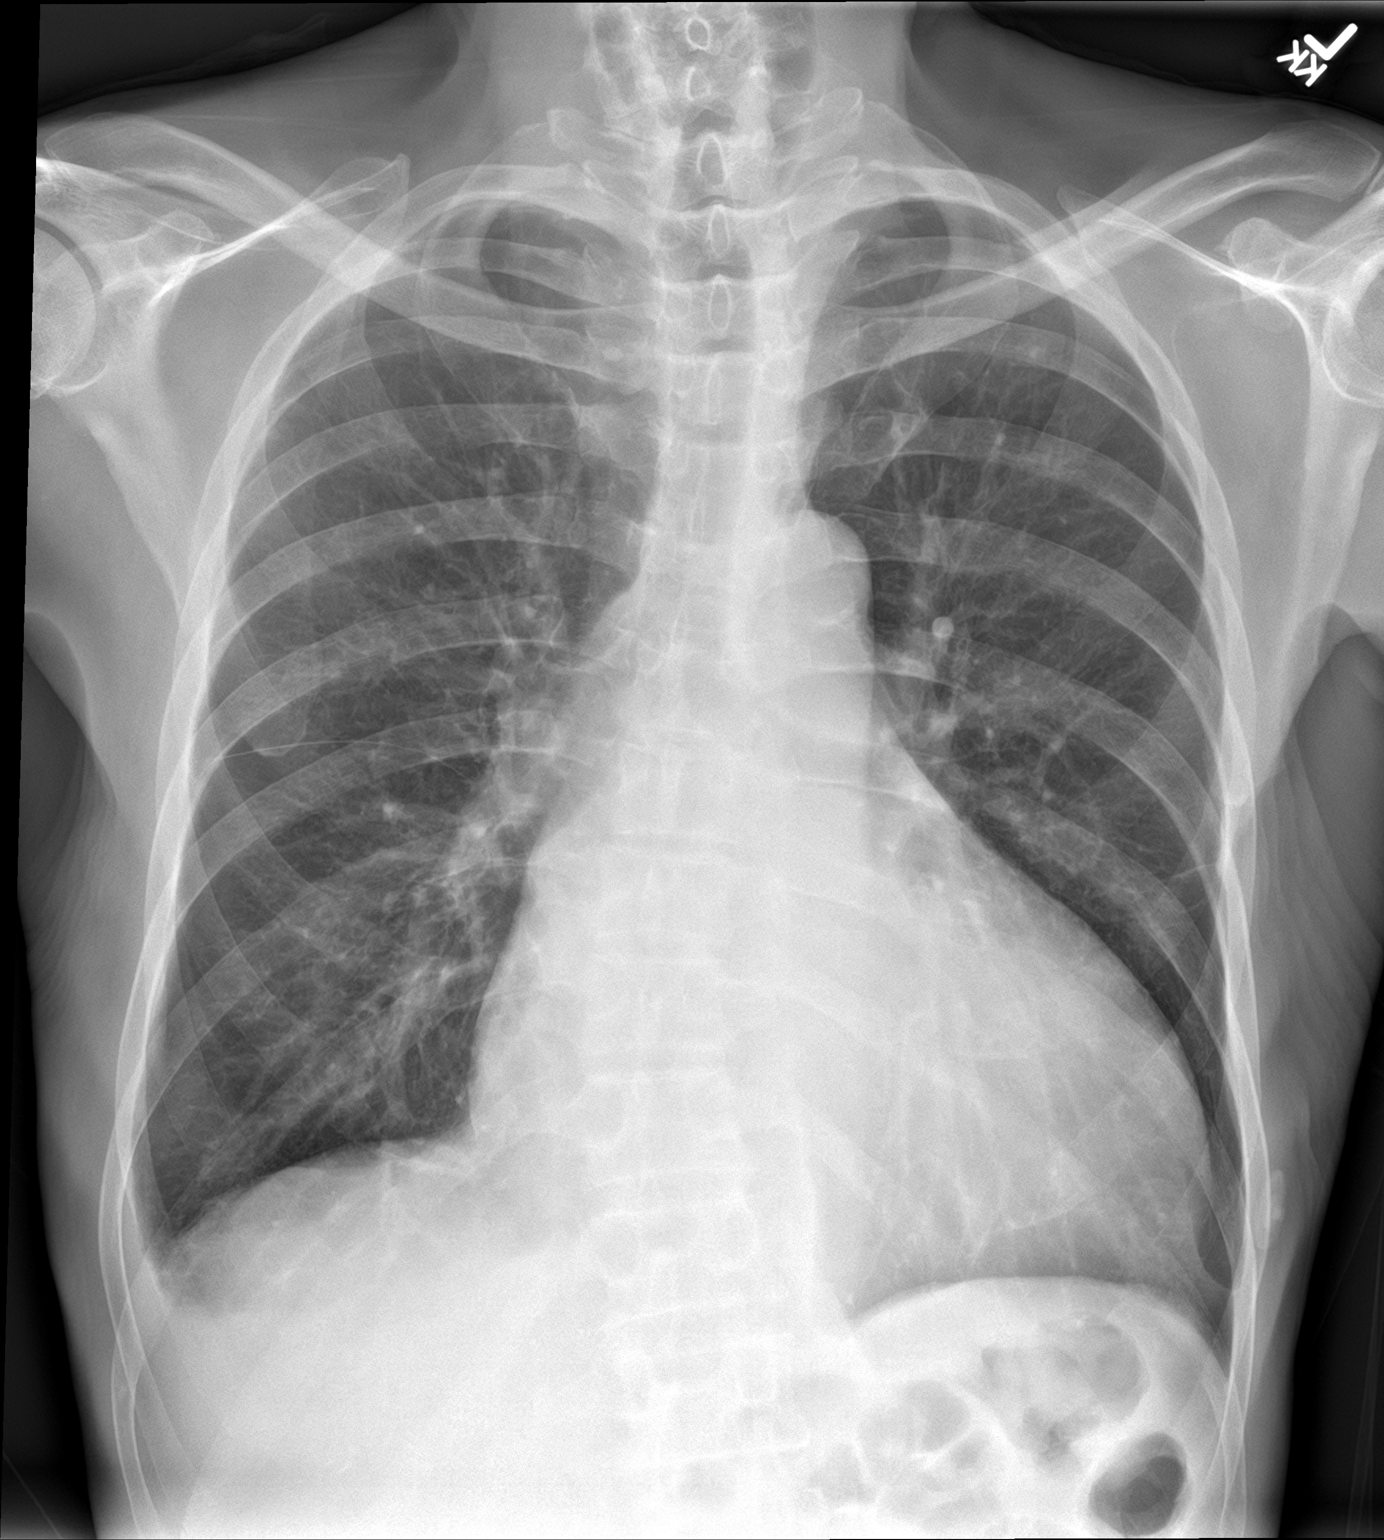

[chest lat]
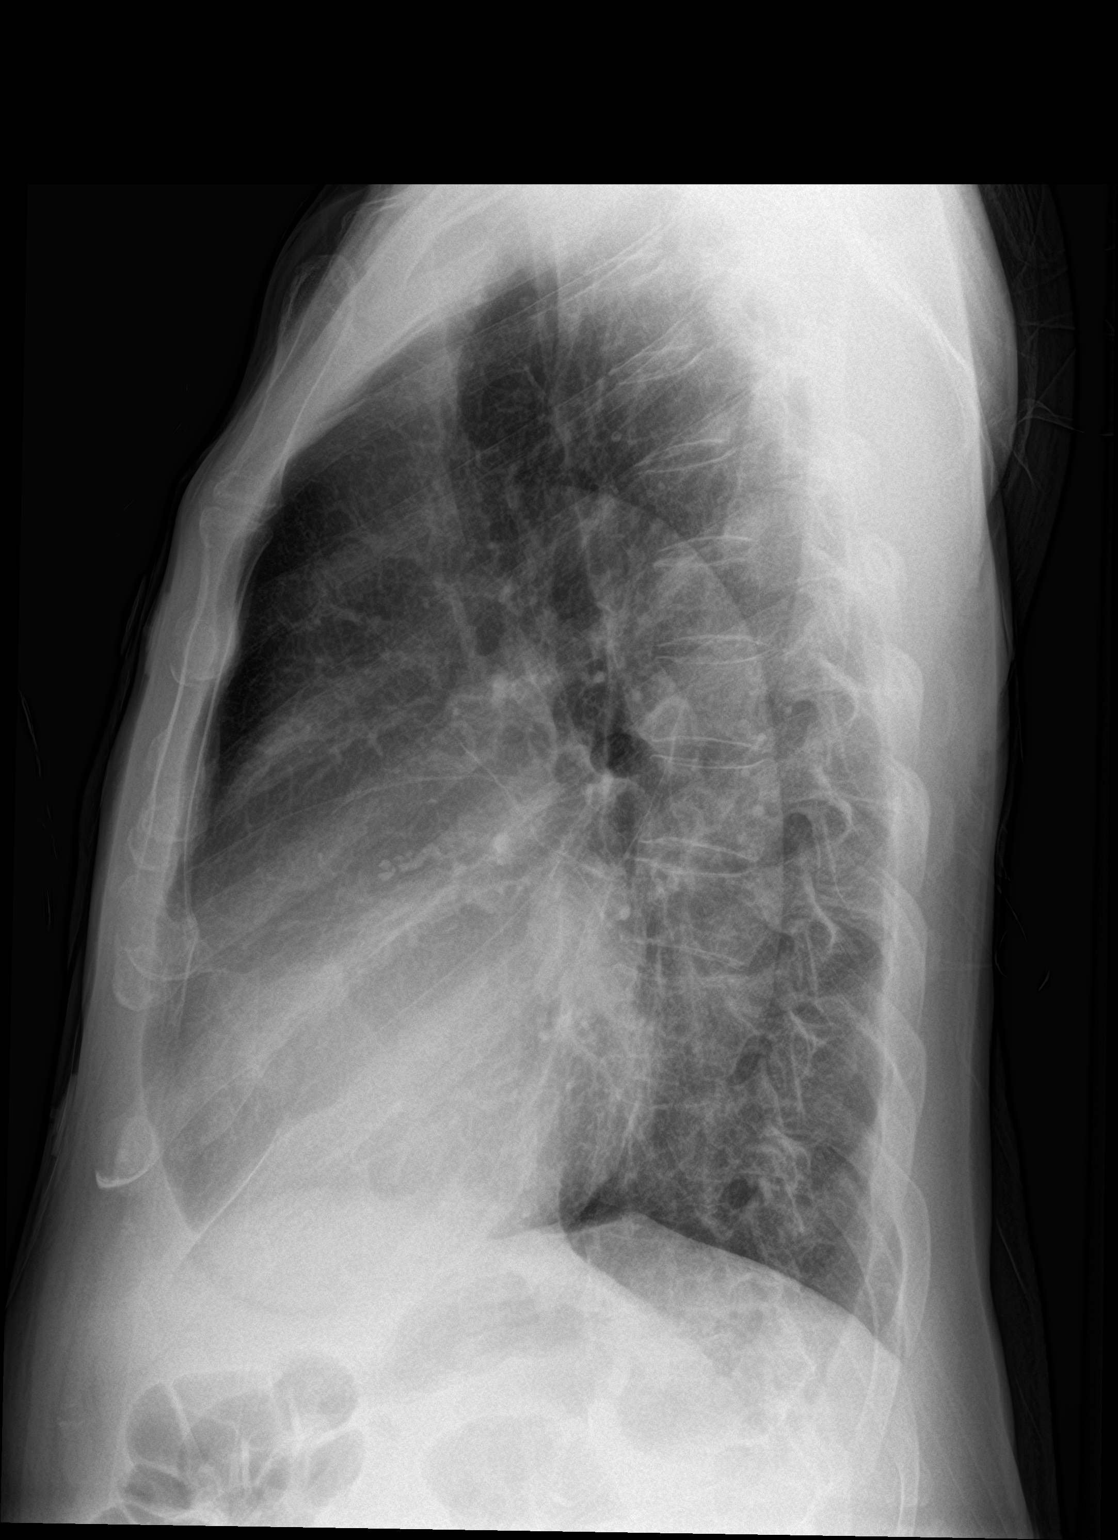

[2 of 2 positions shown; findings below may reference images not displayed]

FINDINGS: The heart is enlarged, stable in configuration. Small RIGHT pleural
effusion or pleural thickening may be chronic. There is no pulmonary
edema. No consolidations.
IMPRESSION: Stable cardiomegaly.

Stable RIGHT pleural thickening or pleural effusion.

## 2021-09-07 IMAGING — DX DG CHEST 1V PORT
1 series · 1 of 1 positions shown · non-contrast
Comparison: 05/25/2020

CLINICAL DATA: Dyspnea

EXAM:
PORTABLE CHEST 1 VIEW

[chest ap]
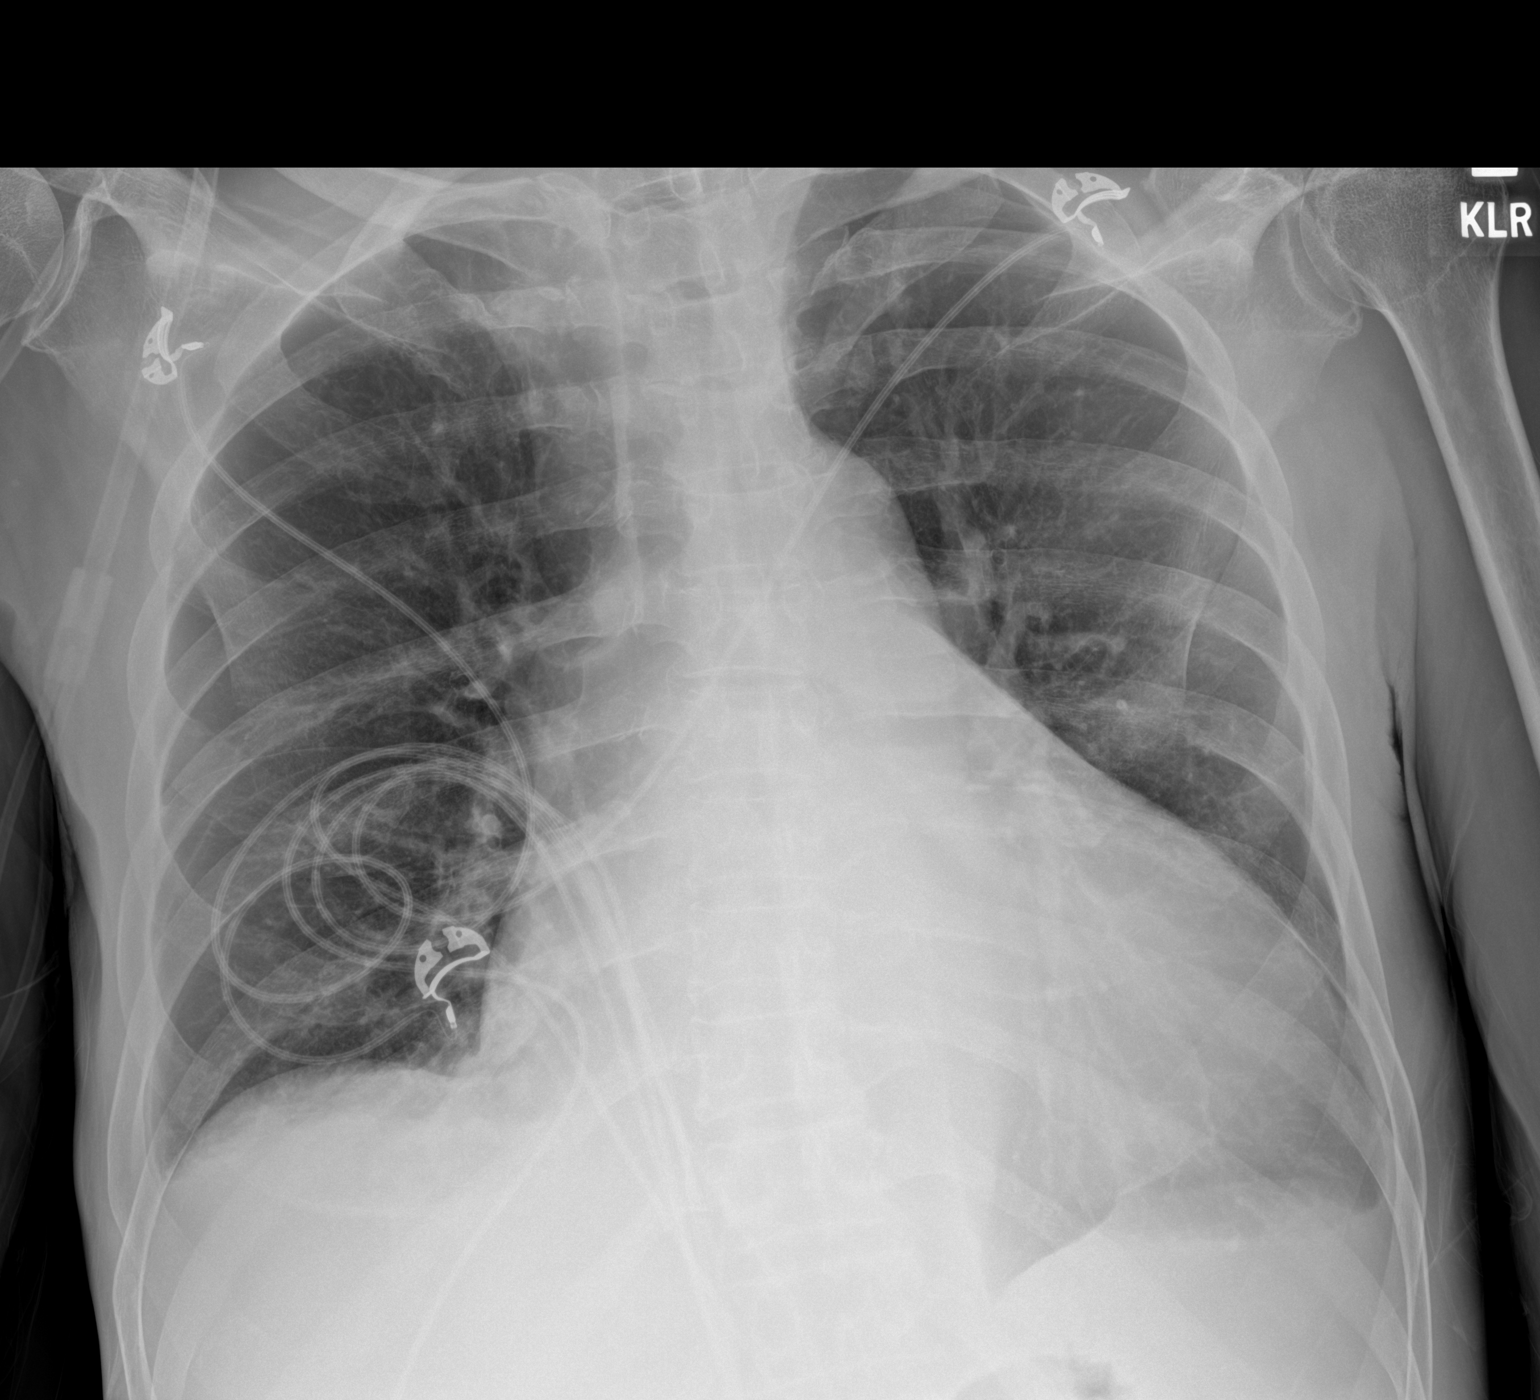

[1 of 1 positions shown; findings below may reference images not displayed]

FINDINGS: Cardiac shadow is enlarged but stable. Lungs are well aerated
bilaterally. Minimal right costophrenic angle blunting is seen. This
is stable from the prior exam. No new focal infiltrate is seen. No
bony abnormality is noted.
IMPRESSION: No change from the prior exam.
